# Patient Record
Sex: Male | Born: 1945 | Race: White | Hispanic: No | Smoking: Former smoker
Health system: Southern US, Community
[De-identification: ages and names within clinical notes are randomized; demographics above are authoritative.]

## PROBLEM LIST (undated history)

## (undated) DIAGNOSIS — Z972 Presence of dental prosthetic device (complete) (partial): Secondary | ICD-10-CM

## (undated) DIAGNOSIS — Z9981 Dependence on supplemental oxygen: Secondary | ICD-10-CM

## (undated) DIAGNOSIS — J40 Bronchitis, not specified as acute or chronic: Secondary | ICD-10-CM

## (undated) DIAGNOSIS — K449 Diaphragmatic hernia without obstruction or gangrene: Secondary | ICD-10-CM

## (undated) DIAGNOSIS — I509 Heart failure, unspecified: Secondary | ICD-10-CM

## (undated) DIAGNOSIS — J449 Chronic obstructive pulmonary disease, unspecified: Secondary | ICD-10-CM

## (undated) DIAGNOSIS — K219 Gastro-esophageal reflux disease without esophagitis: Secondary | ICD-10-CM

## (undated) DIAGNOSIS — K5792 Diverticulitis of intestine, part unspecified, without perforation or abscess without bleeding: Secondary | ICD-10-CM

---

## 2003-01-30 HISTORY — PX: LUNG REMOVAL, PARTIAL: SHX233

## 2003-10-01 ENCOUNTER — Other Ambulatory Visit: Payer: Self-pay

## 2003-10-29 ENCOUNTER — Inpatient Hospital Stay: Payer: Self-pay | Admitting: General Surgery

## 2003-10-29 ENCOUNTER — Other Ambulatory Visit: Payer: Self-pay

## 2003-11-24 ENCOUNTER — Other Ambulatory Visit: Payer: Self-pay

## 2009-05-08 ENCOUNTER — Ambulatory Visit: Payer: Self-pay | Admitting: Family Medicine

## 2010-02-20 ENCOUNTER — Ambulatory Visit: Payer: Self-pay | Admitting: Gastroenterology

## 2010-09-11 ENCOUNTER — Ambulatory Visit: Payer: Self-pay

## 2011-07-20 ENCOUNTER — Ambulatory Visit: Payer: Self-pay | Admitting: Medical

## 2011-07-20 LAB — URINALYSIS, COMPLETE
Bacteria: NEGATIVE
Glucose,UR: NEGATIVE mg/dL (ref 0–75)
Ketone: NEGATIVE
Nitrite: NEGATIVE
Protein: NEGATIVE
Specific Gravity: 1.005 (ref 1.003–1.030)

## 2011-07-21 LAB — URINE CULTURE

## 2012-10-11 LAB — URINALYSIS, COMPLETE
Bilirubin,UR: NEGATIVE
Glucose,UR: NEGATIVE mg/dL (ref 0–75)
Hyaline Cast: 5
Nitrite: NEGATIVE
RBC,UR: 4 /HPF (ref 0–5)
Specific Gravity: 1.025 (ref 1.003–1.030)
Squamous Epithelial: NONE SEEN
WBC UR: <1 /HPF (ref 0–5)

## 2012-10-11 LAB — CBC
HCT: 45 % (ref 40.0–52.0)
MCH: 31.8 pg (ref 26.0–34.0)
MCV: 93 fL (ref 80–100)
Platelet: 292 10*3/uL (ref 150–440)
RDW: 13.2 % (ref 11.5–14.5)

## 2012-10-11 LAB — COMPREHENSIVE METABOLIC PANEL
Albumin: 4.1 g/dL (ref 3.4–5.0)
Anion Gap: 5 — ABNORMAL LOW (ref 7–16)
BUN: 13 mg/dL (ref 7–18)
Bilirubin,Total: 0.7 mg/dL (ref 0.2–1.0)
Chloride: 104 mmol/L (ref 98–107)
Co2: 26 mmol/L (ref 21–32)
EGFR (Non-African Amer.): >60
Glucose: 110 mg/dL — ABNORMAL HIGH (ref 65–99)
Osmolality: 271 (ref 275–301)
SGPT (ALT): 31 U/L (ref 12–78)

## 2012-10-11 LAB — LIPASE, BLOOD: Lipase: >10000 U/L — ABNORMAL HIGH (ref 73–393)

## 2012-10-12 ENCOUNTER — Inpatient Hospital Stay: Payer: Self-pay | Admitting: Student

## 2012-10-12 LAB — LIPID PANEL
Cholesterol: 134 mg/dL (ref 0–200)
VLDL Cholesterol, Calc: 17 mg/dL (ref 5–40)

## 2012-10-12 LAB — CBC WITH DIFFERENTIAL/PLATELET
Eosinophil %: 0.7 %
Lymphocyte #: 1.9 10*3/uL (ref 1.0–3.6)
Lymphocyte %: 22.5 %
MCH: 32.4 pg (ref 26.0–34.0)
MCHC: 35.2 g/dL (ref 32.0–36.0)
Monocyte #: 0.8 x10 3/mm (ref 0.2–1.0)
Monocyte %: 9.8 %
Neutrophil #: 5.6 10*3/uL (ref 1.4–6.5)
Neutrophil %: 66.7 %
RBC: 4.08 10*6/uL — ABNORMAL LOW (ref 4.40–5.90)
RDW: 13.6 % (ref 11.5–14.5)
WBC: 8.4 10*3/uL (ref 3.8–10.6)

## 2012-10-12 LAB — BASIC METABOLIC PANEL
Anion Gap: 5 — ABNORMAL LOW (ref 7–16)
BUN: 11 mg/dL (ref 7–18)
Chloride: 107 mmol/L (ref 98–107)
Co2: 27 mmol/L (ref 21–32)
EGFR (Non-African Amer.): >60
Glucose: 87 mg/dL (ref 65–99)
Potassium: 3.7 mmol/L (ref 3.5–5.1)
Sodium: 139 mmol/L (ref 136–145)

## 2012-10-12 LAB — LIPASE, BLOOD: Lipase: 5641 U/L — ABNORMAL HIGH (ref 73–393)

## 2012-10-13 LAB — LIPASE, BLOOD: Lipase: 1057 U/L — ABNORMAL HIGH (ref 73–393)

## 2012-10-15 LAB — LIPASE, BLOOD: Lipase: 606 U/L — ABNORMAL HIGH (ref 73–393)

## 2012-11-08 ENCOUNTER — Emergency Department: Payer: Self-pay | Admitting: Emergency Medicine

## 2012-11-08 LAB — CBC
MCH: 32.1 pg (ref 26.0–34.0)
MCHC: 34.9 g/dL (ref 32.0–36.0)
Platelet: 250 10*3/uL (ref 150–440)
RBC: 4.42 10*6/uL (ref 4.40–5.90)
WBC: 5.6 10*3/uL (ref 3.8–10.6)

## 2012-11-08 LAB — URINALYSIS, COMPLETE
Bacteria: NONE SEEN
Blood: NEGATIVE
Glucose,UR: NEGATIVE mg/dL (ref 0–75)
Leukocyte Esterase: NEGATIVE
Nitrite: NEGATIVE
Ph: 5 (ref 4.5–8.0)
Protein: NEGATIVE
Specific Gravity: 1.006 (ref 1.003–1.030)
Squamous Epithelial: <1
WBC UR: 1 /HPF (ref 0–5)

## 2012-11-08 LAB — COMPREHENSIVE METABOLIC PANEL
Albumin: 4 g/dL (ref 3.4–5.0)
Anion Gap: 4 — ABNORMAL LOW (ref 7–16)
Bilirubin,Total: 0.4 mg/dL (ref 0.2–1.0)
Creatinine: 1.18 mg/dL (ref 0.60–1.30)
EGFR (African American): >60
EGFR (Non-African Amer.): >60
Glucose: 93 mg/dL (ref 65–99)
Potassium: 4.3 mmol/L (ref 3.5–5.1)
SGPT (ALT): 24 U/L (ref 12–78)
Sodium: 136 mmol/L (ref 136–145)
Total Protein: 7.7 g/dL (ref 6.4–8.2)

## 2012-11-08 LAB — LIPASE, BLOOD: Lipase: 206 U/L (ref 73–393)

## 2012-11-09 LAB — TROPONIN I: Troponin-I: <0.02 ng/mL

## 2012-12-04 ENCOUNTER — Ambulatory Visit: Payer: Self-pay | Admitting: Unknown Physician Specialty

## 2012-12-04 DIAGNOSIS — K449 Diaphragmatic hernia without obstruction or gangrene: Secondary | ICD-10-CM | POA: Insufficient documentation

## 2012-12-04 DIAGNOSIS — K2980 Duodenitis without bleeding: Secondary | ICD-10-CM | POA: Insufficient documentation

## 2012-12-24 ENCOUNTER — Ambulatory Visit: Payer: Self-pay | Admitting: Physician Assistant

## 2012-12-24 LAB — CBC WITH DIFFERENTIAL/PLATELET
Basophil #: 0.1 10*3/uL (ref 0.0–0.1)
Basophil %: 0.8 %
Eosinophil #: 0 10*3/uL (ref 0.0–0.7)
Eosinophil %: 0.1 %
HGB: 15.1 g/dL (ref 13.0–18.0)
Lymphocyte #: 1.2 10*3/uL (ref 1.0–3.6)
Lymphocyte %: 15.3 %
MCH: 31.8 pg (ref 26.0–34.0)
MCHC: 33.5 g/dL (ref 32.0–36.0)
MCV: 95 fL (ref 80–100)
Monocyte #: 0.3 x10 3/mm (ref 0.2–1.0)
Monocyte %: 4 %
Platelet: 300 10*3/uL (ref 150–440)
RDW: 13.2 % (ref 11.5–14.5)
WBC: 8 10*3/uL (ref 3.8–10.6)

## 2012-12-24 LAB — COMPREHENSIVE METABOLIC PANEL
Alkaline Phosphatase: 96 U/L
Anion Gap: 10 (ref 7–16)
BUN: 15 mg/dL (ref 7–18)
Bilirubin,Total: 0.7 mg/dL (ref 0.2–1.0)
Calcium, Total: 9.5 mg/dL (ref 8.5–10.1)
Chloride: 101 mmol/L (ref 98–107)
Creatinine: 1.14 mg/dL (ref 0.60–1.30)
Glucose: 125 mg/dL — ABNORMAL HIGH (ref 65–99)
Potassium: 4.2 mmol/L (ref 3.5–5.1)
SGOT(AST): 19 U/L (ref 15–37)
SGPT (ALT): 26 U/L (ref 12–78)

## 2012-12-24 LAB — AMYLASE: Amylase: 41 U/L (ref 25–115)

## 2013-01-01 ENCOUNTER — Ambulatory Visit: Payer: Self-pay | Admitting: Unknown Physician Specialty

## 2013-01-06 ENCOUNTER — Ambulatory Visit: Payer: Self-pay

## 2013-07-14 DIAGNOSIS — J309 Allergic rhinitis, unspecified: Secondary | ICD-10-CM | POA: Insufficient documentation

## 2013-07-18 ENCOUNTER — Ambulatory Visit: Payer: Self-pay | Admitting: Physician Assistant

## 2013-07-18 LAB — CBC WITH DIFFERENTIAL/PLATELET
Basophil #: 0 10*3/uL (ref 0.0–0.1)
Basophil %: 0.7 %
Eosinophil #: 0 10*3/uL (ref 0.0–0.7)
Eosinophil %: 0.4 %
HCT: 42.2 % (ref 40.0–52.0)
HGB: 14.1 g/dL (ref 13.0–18.0)
LYMPHS ABS: 1.2 10*3/uL (ref 1.0–3.6)
LYMPHS PCT: 19.8 %
MCH: 30.4 pg (ref 26.0–34.0)
MCHC: 33.3 g/dL (ref 32.0–36.0)
MCV: 91 fL (ref 80–100)
Monocyte #: 0.5 x10 3/mm (ref 0.2–1.0)
Monocyte %: 7.9 %
Neutrophil #: 4.3 10*3/uL (ref 1.4–6.5)
Neutrophil %: 71.2 %
Platelet: 282 10*3/uL (ref 150–440)
RBC: 4.62 10*6/uL (ref 4.40–5.90)
RDW: 14 % (ref 11.5–14.5)
WBC: 6 10*3/uL (ref 3.8–10.6)

## 2013-07-18 LAB — COMPREHENSIVE METABOLIC PANEL
ALT: 25 U/L (ref 12–78)
Albumin: 4.1 g/dL (ref 3.4–5.0)
Alkaline Phosphatase: 116 U/L
Anion Gap: 7 (ref 7–16)
BUN: 14 mg/dL (ref 7–18)
Bilirubin,Total: 0.8 mg/dL (ref 0.2–1.0)
CO2: 30 mmol/L (ref 21–32)
Calcium, Total: 9.8 mg/dL (ref 8.5–10.1)
Chloride: 101 mmol/L (ref 98–107)
Creatinine: 1.21 mg/dL (ref 0.60–1.30)
EGFR (African American): >60
EGFR (Non-African Amer.): >60
GLUCOSE: 101 mg/dL — AB (ref 65–99)
OSMOLALITY: 276 (ref 275–301)
Potassium: 4.7 mmol/L (ref 3.5–5.1)
SGOT(AST): 18 U/L (ref 15–37)
SODIUM: 138 mmol/L (ref 136–145)
Total Protein: 8.2 g/dL (ref 6.4–8.2)

## 2013-07-18 LAB — LIPASE, BLOOD: Lipase: 158 U/L (ref 73–393)

## 2013-07-18 LAB — AMYLASE: Amylase: 37 U/L (ref 25–115)

## 2013-09-04 ENCOUNTER — Ambulatory Visit: Payer: Self-pay | Admitting: Unknown Physician Specialty

## 2013-09-08 LAB — PATHOLOGY REPORT

## 2013-09-15 ENCOUNTER — Ambulatory Visit: Payer: Self-pay | Admitting: Unknown Physician Specialty

## 2014-02-01 DIAGNOSIS — M9905 Segmental and somatic dysfunction of pelvic region: Secondary | ICD-10-CM | POA: Diagnosis not present

## 2014-02-01 DIAGNOSIS — M5136 Other intervertebral disc degeneration, lumbar region: Secondary | ICD-10-CM | POA: Diagnosis not present

## 2014-02-01 DIAGNOSIS — M9903 Segmental and somatic dysfunction of lumbar region: Secondary | ICD-10-CM | POA: Diagnosis not present

## 2014-02-01 DIAGNOSIS — M955 Acquired deformity of pelvis: Secondary | ICD-10-CM | POA: Diagnosis not present

## 2014-05-21 NOTE — Discharge Summary (Signed)
PATIENT NAME:  Charles Webster, Charles Webster MR#:  [id number] DATE OF BIRTH:  [birth year]  DATE OF ADMISSION:  10/12/2012 DATE OF DISCHARGE:  10/15/2012  CONSULTANT: Dr. Vira Agar.   PRIMARY CARE PHYSICIAN: Dr. Domenick Gong.   CHIEF COMPLAINT: Abdominal pain.   DISCHARGE DIAGNOSES:  1. Acute pancreatitis.  2. Some blood in the stools, probably from his hemorrhoids.  3. Some fullness of the head of the pancreas on a CAT scan with elevated CA 19-9.   DISCHARGE MEDICATIONS: None.   DIET: GI soft.   ACTIVITY: As tolerated.   FOLLOWUP: Please follow with Dr. Vira Agar and PCP within 1 to 2 weeks.   DISPOSITION: Home.   HISTORY OF PRESENT ILLNESS AND HOSPITAL COURSE: For full details of H and P, please see the dictation on September 14th by Dr. Lavetta Nielsen, but briefly, this is a pleasant 69 year old male with history of chronic prostatitis, on no outpatient medications, who came in for abdominal pain 10 out of 10 and was noted to have pancreatitis with elevated lipase. He was admitted to the hospitalist service with aggressive IV fluid resuscitation, pain control and a GI consult. He did well with significant reduction in his lipase through hospitalization. Of note, it was greater than 10,000 on admission  and it had dropped to 5641 the day after admission. Today, it is 606. His triglycerides were not elevated. His LFTs do not show elevated bilirubin, elevated transaminitis or alk phos. GI thought that this was probably gallstone related and he might have flicked a gallstone. Although that is possible, there is no sludge or gallstones seen on the ultrasound. He had significant improvement in pain and at this point will be discharged with outpatient followup. He did state he had some bleeding on wiping himself, likely from hemorrhoids. He had a colonoscopy from 2012 in which external and internal hemorrhoids were seen. His blood counts are stable. His blood pressure is stable, and he will be discharged as an outpatient. Of  note, he had a CAT scan of the abdomen with contrast showing not only the pancreatitis but some fullness within the head of the uncinate process of the pancreas. He will be discharged with outpatient followup.   PHYSICAL EXAMINATION:  VITAL SIGNS: On the day of discharge, his temperature is 97.6, pulse rate is 89, respiratory rate is 18, blood pressure 135/85, O2 sat is 97% on room air.  GENERAL: Thin male, ambulating in the room, no obvious distress.  HEENT: Normocephalic, atraumatic.  CARDIOVASCULAR: The patient has regular rate and rhythm without murmurs, rubs or gallops on examination of his heart.  LUNGS: Clear.  ABDOMEN: Soft, nontender.  EXTREMITIES: Show no lower extremity edema.   He has been ambulating, tolerating advancement of his diet. He will be discharged with outpatient followup.   TOTAL TIME SPENT: 35 minutes.   CODE STATUS: The patient is FULL CODE.   ____________________________ Vivien Presto, MD sa:gb D: 10/15/2012 15:53:45 ET T: 10/15/2012 21:01:23 ET JOB#: [id number]  cc: Vivien Presto, MD, <Dictator> Fonnie Jarvis. Ilene Qua, MD Vivien Presto MD ELECTRONICALLY SIGNED 10/21/2012 14:08

## 2014-05-21 NOTE — H&P (Signed)
PATIENT NAME:  Charles Webster, Charles Webster MR#:  [id number] DATE OF BIRTH:  Nov 06, 1945  DATE OF ADMISSION:  10/12/2012  REFERRING PHYSICIAN:  Dr. Reita Cliche.  PRIMARY CARE PHYSICIAN: Dr. Ilene Qua.  HISTORY OF PRESENT ILLNESS:  Charles Webster is a 69 year old Caucasian gentleman with a past medical history of chronic prostatitis on Macrobid suppressive therapy, as well as a history of lung cancer for bleb back in 2005, who is presenting with abdominal pain. His symptoms started acutely approximately one week ago with epigastric pain which then evolved to a bandlike radiation. His pain worsened over the week, to the point that he is intolerant to p.o. intake. He described the intensity of 10 and 10 at the worst, constant, and worsened by food. He found no relieving factors, until given pain medications in the Emergency Department. After Fentanyl, he is currently without complaints at the moment. He states that is pain was without associated nausea, vomiting, diarrhea, fevers, chills.  He denies any weight loss. He denies any night sweats.    REVIEW OF SYSTEMS: CONSTITUTIONAL: Denies fevers, fatigue and weakness. Pain is currently controlled.  EYES: Denies blurred vision or pain.  ENT: Denies ear pain or discharge. RESPIRATORY: Denies cough or shortness of breath.  CARDIOVASCULAR: Denies chest pain or palpitations.  GASTROINTESTINAL: Denies nausea, vomiting, diarrhea; however, did mention abdominal pain as above.  GENITOURINARY: Denies dysuria, hematuria.  ENDOCRINE: Denies nocturia or thyroid problems.  HEMATOLOGIC/LYMPHATICS: Denies any easy bruising or bleeding.  MUSCULOSKELETAL: Denies back pain or arthritis.  NEUROLOGIC: Denies paralysis or paresthesias.  PSYCHIATRIC: Denies anxiety or depression.   Otherwise, full review of systems performed by me is negative.   PAST MEDICAL HISTORY: Chronic prostatitis, history of bleb requiring lung surgery back in 2005.   FAMILY HISTORY: Diabetes.   SOCIAL HISTORY: He has a  remote history of tobacco abuse. He stopped smoking back in 2005. Denies alcohol or drug intake. No recent travel.   ALLERGIES: alpha BLOCKING AGENTS, QUINOLONES, CEPHALEXIN, CIPRO, CLINDAMYCIN, DOXYCYCLINE, LEVAQUIN AS WELL AS SULFA DRUGS.   HOME MEDICATIONS: He was taking Macrobid, but stopped this about a week ago.   PHYSICAL EXAMINATION: VITAL SIGNS: Temperature 98.4, heart rate 100, respirations 20, blood pressure 131/87, saturating 96% on room air. Weight 62.6 kg, BMI 22.3.  GENERAL: Currently in no acute distress, awake, alert and oriented x 3.  HEENT: Normocephalic, atraumatic. Extraocular muscles intact. Pupils equal, react to light as well as accommodation. There is no scleral icterus or sublingual jaundice present.  CARDIOVASCULAR: S1, S2, regular rate and rhythm. No murmurs, rubs or gallops.  PULMONARY: Clear to auscultation bilateral wheezes, rubs or rhonchi.  ABDOMEN: Currently soft, nontender, nondistended, hypoactive bowel sounds.  EXTREMITIES: Reveal no cyanosis, edema or clubbing.  NEUROLOGIC: Cranial nerves II through XII intact. No gross neurological deficits.   LABORATORY DATA: Sodium 135, potassium 4.3, chloride 104, bicarb 26, BUN 13, creatinine 1.17, glucose 110, lipase greater than 10,000, total protein 8.3, albumin 4.1, bilirubin 0.7, alk phos 108, AST 37, ALT 31, WBC of 13.2,  hemoglobin of 15.4, platelets 292. Urinalysis negative for signs of infection. He does have 2+ ketones present. CT scan performed of the abdomen; final read pending.  Preliminary read reveals head of pancreas abnormality, which is enlarged.  ASSESSMENT AND PLAN: A 69 year old gentleman, history of chronic prostatitis, presenting with abdominal pain for over a week. In the Emergency Department, found to have a lipase greater than 10,000,   1.  Pancreatitis. Lipase greater than 10,000.  Morphine for pain control. IV  fluids at 250 mL an hour. We will recheck lipase, BMP and CBC, to follow along BUN  and hematocrit for adequate fluid resuscitation. Follow up on CT final read, which was performed in the Emergency Department and once again it appears to be head of pancreas abnormality, but also check abdominal ultrasound to check gallbladder biliary tract, as well as head of pancreas. He is to remain n.p.o. at this time.  2.  Hyponatremia. Continue IV fluid hydration. Follow sodium levels. 3.  CODE STATUS:  He is full code.   Total time spent:  33 minutes.    ____________________________ [name], MD dkh:nts D: 10/12/2012 01:49:31 ET T: 10/12/2012 02:20:02 ET JOB#: [id number]  cc: [name], MD, <Dictator> DAVID Woodfin Ganja MD ELECTRONICALLY SIGNED 10/12/2012 2:52

## 2014-05-21 NOTE — Consult Note (Signed)
CC: pancreatitis.  Pt lipase fell from 10,000 to 1000 and he has no pain in abdomen.  he likely passed a stone.  On clear liquids,. Abd soft and non tender.  he has rectal bleeding from hemorrhoids (per patient)  I am not sure when he has had a colonoscopy.  Will need one later if not up to date.   Continue current course.  Electronic Signatures: Scot JunElliott, Casmir Auguste T (MD)  (Signed on 15-Sep-14 13:31)  Authored  Last Updated: 15-Sep-14 13:31 by Scot JunElliott, Koraima Albertsen T (MD)

## 2014-05-21 NOTE — Consult Note (Signed)
PATIENT NAME:  Charles Webster, Charles Webster MR#:  [id number] DATE OF BIRTH:  08/28/1945  GASTROENTEROLOGY CONSULTATION  DATE OF CONSULTATION:  10/12/2012  The patient is a 69 year old white male who had an onset of abdominal pain a week ago, with radiation across abdomen into his back pain. The pain became intolerable,  with an intensity of 10/10 and seemed to be worse with food. He was seen in the ER and found to have acute pancreatitis. Was  given fentanyl, and his last fentanyl was 7 hours ago. I was asked to see him in consultation for pancreatitis.   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   This is the his bout of pancreatitis.   PAST MEDICAL HISTORY: Includes three operations for lung surgery. He had a bleb that ruptured. Had 2 operations here, and [facility].   REVIEW OF SYSTEMS: No fever. No shortness of breath. No chest pains. No diarrhea. He does have chronic prostatitis. He has been on various medications for this in the past. He had been on Macrobid therapy recently.   FAMILY HISTORY: Positive for diabetes.   SOCIAL HISTORY: Stopped smoking in 2005.   ALLERGIES: He lists multiple medicines as allergies, but they actually were more intolerances, often causing abdominal discomfort. FOR INSTANCE, HE SAID HE TOOK CIPRO FOR MANY YEARS AND THEN IT STARTED BOTHERING HIS STOMACH, SO MOST OF THE MEDICINES THAT HE LISTS ARE NOT TRUE ALLERGIES BUT MORE DEVELOPED SIDE-EFFECTS AFTER TAKING THEM.  PHYSICAL EXAMINATION: GENERAL: White male in no acute distress.  HEENT: Sclerae nonicteric. Conjunctivae negative.  HEAD: Atraumatic.  CHEST: Clear.   HEART: Showed no murmurs or gallops I can hear.  ABDOMEN: Only slight tenderness in epigastric area. No hepatosplenomegaly. No masses. No bruits.   LAB DATA: Lipase greater than 10,000. Sodium 135, potassium 4.3,  chloride 104, bicarb 26, BUN 13, creatinine 1.17, glucose 110, albumin 4.1, total bilirubin 0.7, alkaline phosphatase 108, AST 37, ALT 31. WBC is 13.2, hemoglobin 15.4, platelet count 292.   CAT scan shows abnormality in the head of the pancreas.   ASSESSMENT: Given the fact that he does not drink and he is 66, most likely cause for his pancreatitis would be gallstone pancreatitis. Given fullness in the head of the pancreas it is possible that he could have a pancreatic tumor with duct blockage.   The fact that he has had no pain medicine in 7 hours and is feeling very little pain at this time may indicate that he has passed a stone, or there could be a ball-valve up in the common bile duct.   RECOMMENDATIONS: 1.  Continue IV fluid, with aggressive hydration.  2. Follow lab work: Based on his improvement in symptoms, I expect to see his lipase fall rapidly in the next couple of days. If he ever has another attack consider empiric gallbladder removal.    ____________________________ Scot Junobert T. Dal Blew, MD rte:dm D: 10/12/2012 13:06:05 ET T: 10/12/2012 13:38:00 ET JOB#: [id number]  cc: Scot Junobert T. Breydon Senters, MD, <Dictator> Randon Goldsmithebecca L. Shaune PollackLord, MD Jorje GuildGlenn R. Beckey DowningWillett, MD   Scot JunOBERT T Kincaid Tiger MD ELECTRONICALLY SIGNED 11/01/2012 15:26

## 2014-05-21 NOTE — Consult Note (Signed)
CC: pancreatitis.  He wants to go home in morning, feels like he could eat more food in morning. I will sign off. VSS afebrile, no pain to report.  Electronic Signatures: Scot JunElliott, Julliette Frentz T (MD)  (Signed on 16-Sep-14 18:06)  Authored  Last Updated: 16-Sep-14 18:06 by Scot JunElliott, Zeniah Briney T (MD)

## 2014-08-10 ENCOUNTER — Encounter: Payer: Self-pay | Admitting: *Deleted

## 2014-08-10 ENCOUNTER — Ambulatory Visit: Payer: Medicare Other

## 2014-08-10 ENCOUNTER — Ambulatory Visit
Admission: EM | Admit: 2014-08-10 | Discharge: 2014-08-10 | Disposition: A | Discharge status: Home / Self Care | Payer: Medicare Other | Attending: Family Medicine | Admitting: Family Medicine

## 2014-08-10 DIAGNOSIS — Z902 Acquired absence of lung [part of]: Secondary | ICD-10-CM | POA: Diagnosis not present

## 2014-08-10 DIAGNOSIS — R0602 Shortness of breath: Secondary | ICD-10-CM | POA: Diagnosis not present

## 2014-08-10 DIAGNOSIS — R059 Cough, unspecified: Secondary | ICD-10-CM

## 2014-08-10 DIAGNOSIS — Z87891 Personal history of nicotine dependence: Secondary | ICD-10-CM | POA: Diagnosis not present

## 2014-08-10 DIAGNOSIS — Z79899 Other long term (current) drug therapy: Secondary | ICD-10-CM | POA: Insufficient documentation

## 2014-08-10 DIAGNOSIS — R05 Cough: Secondary | ICD-10-CM | POA: Diagnosis not present

## 2014-08-10 DIAGNOSIS — J40 Bronchitis, not specified as acute or chronic: Secondary | ICD-10-CM | POA: Insufficient documentation

## 2014-08-10 MED ORDER — IPRATROPIUM-ALBUTEROL 0.5-2.5 (3) MG/3ML IN SOLN
3.0000 mL | Freq: Once | RESPIRATORY_TRACT | Status: AC
  Administered 2014-08-10: 3 mL via RESPIRATORY_TRACT

## 2014-08-10 MED ORDER — HYDROCOD POLST-CPM POLST ER 10-8 MG/5ML PO SUER: 5.0000 mL | Freq: Two times a day (BID) | ORAL | Status: DC | PRN

## 2014-08-10 MED ORDER — AZITHROMYCIN 250 MG PO TABS: ORAL_TABLET | ORAL | Status: DC

## 2014-08-10 NOTE — ED Notes (Signed)
Pt states "started about 2 weeks ago, thought it was allergies, congestion, sore throat from coughing so much, headache from coughing"

## 2014-08-10 NOTE — ED Provider Notes (Signed)
CSN: [id number]     Arrival date & time 08/10/14  1736 History   First MD Initiated Contact with Patient 08/10/14 1815     No chief complaint on file.  (Consider location/radiation/quality/duration/timing/severity/associated sxs/prior Treatment) HPI Comments: 69 yo male with a 2-3 weeks h/o productive cough, fatigue, chills. Patient is a former smoker who also worked in [employer]; h/o partial right lung lobectomy years ago. Denies chest pains.   The history is provided by the patient.    History reviewed. No pertinent past medical history. Past Surgical History  Procedure Laterality Date  . Lung removal, partial  2005    removed 20% of right lung   No family history on file. History  Substance Use Topics  . Smoking status: Former Smoker    Quit date: 10/04/2003  . Smokeless tobacco: Not on file  . Alcohol Use: No    Review of Systems  Allergies  Alpha blocker quinazolines; Clindamycin/lincomycin; Levaquin; and Penicillins  Home Medications   Prior to Admission medications   Medication Sig Start Date End Date Taking? Authorizing Provider  dextromethorphan-guaiFENesin (ROBITUSSIN-DM) 10-100 MG/5ML liquid Take by mouth every 4 (four) hours as needed for cough.   Yes Historical Provider, MD  fluticasone (FLONASE) 50 MCG/ACT nasal spray Place into both nostrils daily.   Yes Historical Provider, MD  guaiFENesin (MUCINEX) 600 MG 12 hr tablet Take by mouth 2 (two) times daily.   Yes Historical Provider, MD  omeprazole (PRILOSEC) 40 MG capsule Take 40 mg by mouth daily.   Yes Historical Provider, MD  azithromycin (ZITHROMAX Z-PAK) 250 MG tablet 2 tabs po once day 1, then 1 tab po qd for next 4 days 08/10/14   Payton Mccallumrlando Saraia Platner, MD  chlorpheniramine-HYDROcodone [facility](TUSSIONEX PENNKINETIC ER) 10-8 MG/5ML SUER Take 5 mLs by mouth every 12 (twelve) hours as needed for cough. 08/10/14   Payton Mccallumrlando Jisell Majer, MD   BP 156/119 mmHg  Pulse 90  Temp(Src) 98.2 F (36.8 C) (Oral)  Ht 5\' 6"  (1.676 m)  Wt  130 lb (58.968 kg)  BMI 20.99 kg/m2  SpO2 95% Physical Exam  Constitutional: He appears well-developed and well-nourished. No distress.  HENT:  Head: Normocephalic and atraumatic.  Right Ear: Tympanic membrane, external ear and ear canal normal.  Left Ear: Tympanic membrane, external ear and ear canal normal.  Nose: Nose normal.  Mouth/Throat: Uvula is midline, oropharynx is clear and moist and mucous membranes are normal. No oropharyngeal exudate or tonsillar abscesses.  Eyes: Conjunctivae and EOM are normal. Pupils are equal, round, and reactive to light. Right eye exhibits no discharge. Left eye exhibits no discharge. No scleral icterus.  Neck: Normal range of motion. Neck supple. No tracheal deviation present. No thyromegaly present.  Cardiovascular: Normal rate, regular rhythm and normal heart sounds.   Pulmonary/Chest: Effort normal. No stridor. No respiratory distress. He has wheezes. He has no rales. He exhibits no tenderness.  Mild diffuse expiratory wheezes and rhonchi  Lymphadenopathy:    He has no cervical adenopathy.  Neurological: He is alert.  Skin: Skin is warm and dry. No rash noted. He is not diaphoretic.  Nursing note and vitals reviewed.   ED Course  Procedures (including critical care time) Labs Review Labs Reviewed - No data to display  Imaging Review Dg Chest 2 View  08/10/2014   CLINICAL DATA:  Cough for 2 weeks, shortness of Breath, history of right lung surgery  EXAM: CHEST  2 VIEW  COMPARISON:  05/08/2009  FINDINGS: Cardiomediastinal silhouette is stable. Again noted  status post right upper lobectomy. No acute infiltrate or pleural effusion. No pulmonary edema. Hyperinflation again noted. There is chronic blunting of the right costophrenic angle. Left lung is clear. Stable postsurgical changes in right upper lobe.  IMPRESSION: Stable postsurgical changes post right upper lobectomy. No active disease. Hyperinflation again noted. Again noted chronic blunting of  the right costophrenic angle.   Electronically Signed   By: Natasha Mead M.D.   On: 08/10/2014 19:06     MDM   1. Cough   2. Bronchitis    Discharge Medication List as of 08/10/2014  7:33 PM    START taking these medications   Details  azithromycin (ZITHROMAX Z-PAK) 250 MG tablet 2 tabs po once day 1, then 1 tab po qd for next 4 days, Normal    chlorpheniramine-HYDROcodone (TUSSIONEX PENNKINETIC ER) 10-8 MG/5ML SUER Take 5 mLs by mouth every 12 (twelve) hours as needed for cough., Starting 08/10/2014, Until Discontinued, Normal      Plan: 1.x-ray results and diagnosis reviewed with patient 2. rx as per orders; risks, benefits, potential side effects reviewed with patient 3. Recommend supportive treatment with rest, increased fluids 4. F/u prn if symptoms worsen or don't improve    Payton Mccallum, MD 08/10/14 2015

## 2014-08-20 DIAGNOSIS — R05 Cough: Secondary | ICD-10-CM | POA: Diagnosis not present

## 2014-08-20 DIAGNOSIS — N411 Chronic prostatitis: Secondary | ICD-10-CM | POA: Diagnosis not present

## 2014-10-06 DIAGNOSIS — H3531 Nonexudative age-related macular degeneration: Secondary | ICD-10-CM | POA: Diagnosis not present

## 2014-11-19 DIAGNOSIS — N411 Chronic prostatitis: Secondary | ICD-10-CM | POA: Diagnosis not present

## 2014-12-29 DIAGNOSIS — M9905 Segmental and somatic dysfunction of pelvic region: Secondary | ICD-10-CM | POA: Diagnosis not present

## 2014-12-29 DIAGNOSIS — M9903 Segmental and somatic dysfunction of lumbar region: Secondary | ICD-10-CM | POA: Diagnosis not present

## 2014-12-29 DIAGNOSIS — M5136 Other intervertebral disc degeneration, lumbar region: Secondary | ICD-10-CM | POA: Diagnosis not present

## 2014-12-29 DIAGNOSIS — M955 Acquired deformity of pelvis: Secondary | ICD-10-CM | POA: Diagnosis not present

## 2015-01-01 DIAGNOSIS — M9903 Segmental and somatic dysfunction of lumbar region: Secondary | ICD-10-CM | POA: Diagnosis not present

## 2015-01-01 DIAGNOSIS — M5136 Other intervertebral disc degeneration, lumbar region: Secondary | ICD-10-CM | POA: Diagnosis not present

## 2015-01-01 DIAGNOSIS — M955 Acquired deformity of pelvis: Secondary | ICD-10-CM | POA: Diagnosis not present

## 2015-01-01 DIAGNOSIS — M9905 Segmental and somatic dysfunction of pelvic region: Secondary | ICD-10-CM | POA: Diagnosis not present

## 2015-01-03 DIAGNOSIS — M9903 Segmental and somatic dysfunction of lumbar region: Secondary | ICD-10-CM | POA: Diagnosis not present

## 2015-01-03 DIAGNOSIS — M9905 Segmental and somatic dysfunction of pelvic region: Secondary | ICD-10-CM | POA: Diagnosis not present

## 2015-01-03 DIAGNOSIS — M5136 Other intervertebral disc degeneration, lumbar region: Secondary | ICD-10-CM | POA: Diagnosis not present

## 2015-01-03 DIAGNOSIS — M955 Acquired deformity of pelvis: Secondary | ICD-10-CM | POA: Diagnosis not present

## 2015-01-11 DIAGNOSIS — M9905 Segmental and somatic dysfunction of pelvic region: Secondary | ICD-10-CM | POA: Diagnosis not present

## 2015-01-11 DIAGNOSIS — M9903 Segmental and somatic dysfunction of lumbar region: Secondary | ICD-10-CM | POA: Diagnosis not present

## 2015-01-11 DIAGNOSIS — M5136 Other intervertebral disc degeneration, lumbar region: Secondary | ICD-10-CM | POA: Diagnosis not present

## 2015-01-11 DIAGNOSIS — M955 Acquired deformity of pelvis: Secondary | ICD-10-CM | POA: Diagnosis not present

## 2015-04-09 ENCOUNTER — Ambulatory Visit
Admission: EM | Admit: 2015-04-09 | Discharge: 2015-04-09 | Disposition: A | Discharge status: Home / Self Care | Payer: Medicare Other | Attending: Family Medicine | Admitting: Family Medicine

## 2015-04-09 ENCOUNTER — Encounter: Payer: Self-pay | Admitting: Emergency Medicine

## 2015-04-09 ENCOUNTER — Ambulatory Visit: Payer: Medicare Other

## 2015-04-09 DIAGNOSIS — Z87891 Personal history of nicotine dependence: Secondary | ICD-10-CM | POA: Diagnosis not present

## 2015-04-09 DIAGNOSIS — J069 Acute upper respiratory infection, unspecified: Secondary | ICD-10-CM | POA: Diagnosis not present

## 2015-04-09 DIAGNOSIS — J40 Bronchitis, not specified as acute or chronic: Secondary | ICD-10-CM | POA: Insufficient documentation

## 2015-04-09 DIAGNOSIS — J209 Acute bronchitis, unspecified: Secondary | ICD-10-CM | POA: Diagnosis not present

## 2015-04-09 DIAGNOSIS — R0602 Shortness of breath: Secondary | ICD-10-CM | POA: Diagnosis present

## 2015-04-09 DIAGNOSIS — R05 Cough: Secondary | ICD-10-CM | POA: Diagnosis not present

## 2015-04-09 DIAGNOSIS — Z880 Allergy status to penicillin: Secondary | ICD-10-CM | POA: Insufficient documentation

## 2015-04-09 DIAGNOSIS — Z79899 Other long term (current) drug therapy: Secondary | ICD-10-CM | POA: Diagnosis not present

## 2015-04-09 MED ORDER — PREDNISONE 10 MG (21) PO TBPK: ORAL_TABLET | ORAL | Status: DC

## 2015-04-09 MED ORDER — HYDROCOD POLST-CPM POLST ER 10-8 MG/5ML PO SUER: 5.0000 mL | Freq: Two times a day (BID) | ORAL | Status: DC | PRN

## 2015-04-09 MED ORDER — AZITHROMYCIN 250 MG PO TABS: ORAL_TABLET | ORAL | Status: DC

## 2015-04-09 MED ORDER — ALBUTEROL SULFATE HFA 108 (90 BASE) MCG/ACT IN AERS: 2.0000 | INHALATION_SPRAY | RESPIRATORY_TRACT | Status: DC | PRN

## 2015-04-09 MED ORDER — IPRATROPIUM-ALBUTEROL 0.5-2.5 (3) MG/3ML IN SOLN
3.0000 mL | Freq: Once | RESPIRATORY_TRACT | Status: AC
  Administered 2015-04-09: 3 mL via RESPIRATORY_TRACT

## 2015-04-09 NOTE — Discharge Instructions (Signed)
Acute Bronchitis °Bronchitis is when the airways that extend from the windpipe into the lungs get red, puffy, and painful (inflamed). Bronchitis often causes thick spit (mucus) to develop. This leads to a cough. A cough is the most common symptom of bronchitis. °In acute bronchitis, the condition usually begins suddenly and goes away over time (usually in 2 weeks). Smoking, allergies, and asthma can make bronchitis worse. Repeated episodes of bronchitis may cause more lung problems. °HOME CARE °· Rest. °· Drink enough fluids to keep your pee (urine) clear or pale yellow (unless you need to limit fluids as told by your doctor). °· Only take over-the-counter or prescription medicines as told by your doctor. °· Avoid smoking and secondhand smoke. These can make bronchitis worse. If you are a smoker, think about using nicotine gum or skin patches. Quitting smoking will help your lungs heal faster. °· Reduce the chance of getting bronchitis again by: °¨ Washing your hands often. °¨ Avoiding people with cold symptoms. °¨ Trying not to touch your hands to your mouth, nose, or eyes. °· Follow up with your doctor as told. °GET HELP IF: °Your symptoms do not improve after 1 week of treatment. Symptoms include: °· Cough. °· Fever. °· Coughing up thick spit. °· Body aches. °· Chest congestion. °· Chills. °· Shortness of breath. °· Sore throat. °GET HELP RIGHT AWAY IF:  °· You have an increased fever. °· You have chills. °· You have severe shortness of breath. °· You have bloody thick spit (sputum). °· You throw up (vomit) often. °· You lose too much body fluid (dehydration). °· You have a severe headache. °· You faint. °MAKE SURE YOU:  °· Understand these instructions. °· Will watch your condition. °· Will get help right away if you are not doing well or get worse. °  °This information is not intended to replace advice given to you by your health care provider. Make sure you discuss any questions you have with your health care  provider. °  °Document Released: 07/04/2007 Document Revised: 09/17/2012 Document Reviewed: 07/08/2012 °Elsevier Interactive Patient Education ©2016 [employer] Inc[employer] ° °Cough, Adult °A cough helps to clear your throat and lungs. A cough may last only 2-3 weeks (acute), or it may last longer than 8 weeks (chronic). Many different things can cause a cough. A cough may be a sign of an illness or another medical condition. °HOME CARE °· Pay attention to any changes in your cough. °· Take medicines only as told by your doctor. °¨ If you were prescribed an antibiotic medicine, take it as told by your doctor. Do not stop taking it even if you start to feel better. °¨ Talk with your doctor before you try using a cough medicine. °· Drink enough fluid to keep your pee (urine) clear or pale yellow. °· If the air is dry, use a cold steam vaporizer or humidifier in your home. °· Stay away from things that make you cough at work or at home. °· If your cough is worse at night, try using extra pillows to raise your head up higher while you sleep. °· Do not smoke, and try not to be around smoke. If you need help quitting, ask your doctor. °· Do not have caffeine. °· Do not drink alcohol. °· Rest as needed. °GET HELP IF: °· You have new problems (symptoms). °· You cough up yellow fluid (pus). °· Your cough does not get better after 2-3 weeks, or your cough gets worse. °· Medicine does not   help your cough and you are not sleeping well. °· You have pain that gets worse or pain that is not helped with medicine. °· You have a fever. °· You are losing weight and you do not know why. °· You have night sweats. °GET HELP RIGHT AWAY IF: °· You cough up blood. °· You have trouble breathing. °· Your heartbeat is very fast. °  °This information is not intended to replace advice given to you by your health care provider. Make sure you discuss any questions you have with your health care provider. °  °Document Released: 09/28/2010 Document Revised:  10/06/2014 Document Reviewed: 03/24/2014 °Elsevier Interactive Patient Education ©2016 [employer] Inc. ° °

## 2015-04-09 NOTE — ED Notes (Signed)
Patient c/o cough and chest congestion for 4 days.  Patient denies fevers.  

## 2015-04-09 NOTE — ED Provider Notes (Signed)
CSN: [id number]     Arrival date & time 04/09/15  1332 History   First MD Initiated Contact with Patient 04/09/15 1351    Nurses notes were reviewed. Chief Complaint  Patient presents with  . Cough  . Shortness of Breath   Patient the best historian but states that he's been coughing now short of breath about for 5 days. He reports clear phlegm coming up and states no fever but some chills. 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 He states he does not have a history of COPD or emphysema but doesn't deny that he has chronic lung disease. She states no was told him for sure that he has COPD. He smoked until 2005 or 8 years ago so into the 60s he was smoking. He states that when he was seen here this fall and a chest x-ray was done which was negative but that this is not as bad as was in the fall a question whether he needs another chest x-ray. His pulse ox is 95% today and he is not on oxygen at home. He is not clear of some of the medicines he is on or allergies to medications a yes. He's just finished a nebulizer treatment but states it does seem to help him so.   (Consider location/radiation/quality/duration/timing/severity/associated sxs/prior Treatment) Patient is a 70 y.o. male presenting with cough and shortness of breath. The history is provided by the patient.  Cough Cough characteristics:  Productive Sputum characteristics:  White Severity:  Moderate Progression:  Unable to specify Chronicity:  New Context: smoke exposure, upper respiratory infection and with activity   Context: not animal exposure, not occupational exposure, not sick contacts and not weather changes   Relieved by:  Nothing Ineffective treatments:  None tried Associated symptoms: shortness of breath   Risk factors: no chemical exposure and no recent infection   Shortness of Breath Associated symptoms: cough     History reviewed. No pertinent past medical  history. Past Surgical History  Procedure Laterality Date  . Lung removal, partial  2005    removed 20% of right lung   History reviewed. No pertinent family history. Social History  Substance Use Topics  . Smoking status: Former Smoker    Quit date: 10/04/2003  . Smokeless tobacco: None  . Alcohol Use: No    Review of Systems  HENT: Positive for congestion.   Respiratory: Positive for cough and shortness of breath.   Neurological: Positive for dizziness.  All other systems reviewed and are negative.   Allergies  Alpha blocker quinazolines; Clindamycin/lincomycin; Levaquin; and Penicillins  Home Medications   Prior to Admission medications   Medication Sig Start Date End Date Taking? Authorizing Provider  cetirizine (ZYRTEC) 10 MG tablet Take 10 mg by mouth daily.   Yes Historical Provider, MD  albuterol (PROVENTIL HFA;VENTOLIN HFA) 108 (90 Base) MCG/ACT inhaler Inhale 2 puffs into the lungs every 4 (four) hours as needed for wheezing or shortness of breath. 04/09/15   Hassan Rowan, MD  azithromycin (ZITHROMAX Z-PAK) 250 MG tablet 2 tabs po once day 1, then 1 tab po qd for next 4 days 08/10/14   Payton Mccallum, MD  azithromycin (ZITHROMAX Z-PAK) 250 MG tablet Take 2 tablets first day and then 1 po a day for 4 days 04/09/15   Hassan Rowan, MD  chlorpheniramine-HYDROcodone [facility] PENNKINETIC ER) 10-8 MG/5ML SUER Take 5 mLs by mouth every 12 (twelve) hours as needed for cough. 08/10/14   Orlando  Conty, MD  chlorpheniramine-HYDROcodone (TUSSIONEX PENNKINETIC ER) 10-8 MG/5ML SUER Take 5 mLs by mouth every 12 (twelve) hours as needed for cough. 04/09/15   Hassan RowanEugene Trayquan Kolakowski, MD  dextromethorphan-guaiFENesin (ROBITUSSIN-DM) 10-100 MG/5ML liquid Take by mouth every 4 (four) hours as needed for cough.    Historical Provider, MD  fluticasone (FLONASE) 50 MCG/ACT nasal spray Place into both nostrils daily.    Historical Provider, MD  guaiFENesin (MUCINEX) 600 MG 12 hr tablet Take by mouth 2 (two)  times daily.    Historical Provider, MD  omeprazole (PRILOSEC) 40 MG capsule Take 40 mg by mouth daily.    Historical Provider, MD  predniSONE (STERAPRED UNI-PAK 21 TAB) 10 MG (21) TBPK tablet Sig 6 tablet day 1, 5 tablets day 2, 4 tablets day 3,,3tablets day 4, 2 tablets day 5, 1 tablet day 6 take all tablets orally 04/09/15   Hassan RowanEugene Shell Blanchette, MD   Meds Ordered and Administered this Visit   Medications  ipratropium-albuterol (DUONEB) 0.5-2.5 (3) MG/3ML nebulizer solution 3 mL (3 mLs Nebulization Given 04/09/15 1353)    BP 143/92 mmHg  Pulse 77  Temp(Src) 97 F (36.1 C) (Tympanic)  Resp 17  Ht 5\' 6"  (1.676 m)  Wt 130 lb (58.968 kg)  BMI 20.99 kg/m2  SpO2 100% No data found.   Physical Exam  Constitutional: He appears well-developed.  HENT:  Head: Normocephalic and atraumatic.  Right Ear: External ear normal.  Left Ear: External ear normal.  Mouth/Throat: Oropharynx is clear and moist.  Eyes: Pupils are equal, round, and reactive to light.  Neck: Normal range of motion. Neck supple. No tracheal deviation present.  Cardiovascular: Normal rate, regular rhythm and normal heart sounds.   Pulmonary/Chest: Effort normal. No respiratory distress. He has wheezes in the right upper field, the right middle field, the right lower field, the left upper field and the left lower field.  Musculoskeletal: Normal range of motion.  Neurological: He is alert.  Skin: Skin is warm and dry.  Vitals reviewed.   ED Course  Procedures (including critical care time)  Labs Review Labs Reviewed - No data to display  Imaging Review Dg Chest 2 View  04/09/2015  CLINICAL DATA:  Productive cough x 4-5 days EXAM: CHEST  2 VIEW COMPARISON:  08/10/2014 FINDINGS: Postsurgical changes in the right lung apex. Associated volume loss. Stable blunting of the right costophrenic angle. Left lung is clear. The heart is normal in size. Visualized osseous structures are within normal limits. IMPRESSION: Stable  postsurgical changes in the right lung apex. No evidence of acute cardiopulmonary disease. Electronically Signed   By: Charline BillsSriyesh  Krishnan M.D.   On: 04/09/2015 14:30     Visual Acuity Review  Right Eye Distance:   Left Eye Distance:   Bilateral Distance:    Right Eye Near:   Left Eye Near:    Bilateral Near:         MDM   1. Acute bronchitis, unspecified organism   2. URI (upper respiratory infection)    Patient chest x-ray did not show pneumonia showed the course previous changes on the right side of his lungs. He is appears be breathing better since he's had the DuoNeb treatment. Will allow him to follow-up with his PCP as needed and albuterol inhaler Tussionex 1 teaspoon twice a day Z-Pak records [place] use before and also will place him on 6 day course of prednisone as well. Follow-up as needed.   Note: This dictation was prepared with Dragon dictation along with smaller  Lobbyist. Any transcriptional errors that result from this process are unintentional.   Hassan Rowan, MD 04/09/15 1456

## 2015-05-09 ENCOUNTER — Ambulatory Visit
Admission: EM | Admit: 2015-05-09 | Discharge: 2015-05-09 | Disposition: A | Discharge status: Home / Self Care | Payer: Medicare Other | Attending: Family Medicine | Admitting: Family Medicine

## 2015-05-09 ENCOUNTER — Ambulatory Visit: Payer: Medicare Other

## 2015-05-09 ENCOUNTER — Encounter: Payer: Self-pay | Admitting: *Deleted

## 2015-05-09 DIAGNOSIS — J441 Chronic obstructive pulmonary disease with (acute) exacerbation: Secondary | ICD-10-CM | POA: Diagnosis not present

## 2015-05-09 DIAGNOSIS — J209 Acute bronchitis, unspecified: Secondary | ICD-10-CM | POA: Diagnosis not present

## 2015-05-09 HISTORY — DX: Chronic obstructive pulmonary disease, unspecified: J44.9

## 2015-05-09 MED ORDER — IPRATROPIUM-ALBUTEROL 0.5-2.5 (3) MG/3ML IN SOLN
3.0000 mL | Freq: Once | RESPIRATORY_TRACT | Status: AC
  Administered 2015-05-09: 3 mL via RESPIRATORY_TRACT

## 2015-05-09 MED ORDER — PREDNISONE 10 MG (21) PO TBPK: ORAL_TABLET | ORAL | Status: DC

## 2015-05-09 MED ORDER — BENZONATATE 200 MG PO CAPS: 200.0000 mg | ORAL_CAPSULE | Freq: Three times a day (TID) | ORAL | Status: DC | PRN

## 2015-05-09 MED ORDER — ALBUTEROL SULFATE HFA 108 (90 BASE) MCG/ACT IN AERS: 2.0000 | INHALATION_SPRAY | RESPIRATORY_TRACT | Status: DC | PRN

## 2015-05-09 MED ORDER — DOXYCYCLINE HYCLATE 100 MG PO CAPS: 100.0000 mg | ORAL_CAPSULE | Freq: Two times a day (BID) | ORAL | Status: DC

## 2015-05-09 NOTE — ED Provider Notes (Signed)
CSN: [id number]     Arrival date & time 05/09/15  1730 History   First MD Initiated Contact with Patient 05/09/15 1905    Nurses notes were reviewed. Chief Complaint  Patient presents with  . Cough  . Nasal Congestion  . Shortness of Breath   Patient was seen about a month ago. He has a history quite extensive lung disease labs that ruptured chest tubes and removal problems lungs. He states that the Zithromax that I placed him on in March 11 helped for a while but then the cold and congestion came back. He was put on Zithromax because his chest x-ray was negative for pneumonia and he wanted the medicine he received here in August by Dr. Izell Wilton which was a Z-Pak. He states that the cough medicine the test next couple months she still has some he also thought the prednisone helped her much. The congestion has progressively gotten worse over the last week. Offered to get chest x-ray but we x-rayed him today he was here March he doesn't think he has pneumonia yeast feels he just needs another round of antibiotic. He did have a DuoNeb treatment last time he was here he is grossly allowed [place] to give him another DuoNeb treatment today.   He is a former smoker stopped in 2005 he has a history of COPD and stable above he's On his lung removed 20% of his right lungs. He is allergic to Levaquin and clindamycin penicillins and off blockers. No pertinent family medical history related to this visit  (Consider location/radiation/quality/duration/timing/severity/associated sxs/prior Treatment) HPI  Past Medical History  Diagnosis Date  . COPD (chronic obstructive pulmonary disease) [facility])    Past Surgical History  Procedure Laterality Date  . Lung removal, partial  2005    removed 20% of right lung   History reviewed. No pertinent family history. Social History  Substance Use Topics  . Smoking status: Former Smoker    Quit date: 10/04/2003  . Smokeless tobacco: None  . Alcohol Use: No    Review of  Systems  HENT: Positive for congestion.   Respiratory: Positive for cough, shortness of breath and wheezing.   Cardiovascular: Negative for chest pain.  All other systems reviewed and are negative.   Allergies  Alpha blocker quinazolines; Clindamycin/lincomycin; Levaquin; and Penicillins  Home Medications   Prior to Admission medications   Medication Sig Start Date End Date Taking? Authorizing Provider  albuterol (PROVENTIL HFA;VENTOLIN HFA) 108 (90 Base) MCG/ACT inhaler Inhale 2 puffs into the lungs every 4 (four) hours as needed for wheezing or shortness of breath. 04/09/15  Yes Hassan Rowan, MD  azithromycin (ZITHROMAX Z-PAK) 250 MG tablet 2 tabs po once day 1, then 1 tab po qd for next 4 days 08/10/14  Yes Payton Mccallum, MD  azithromycin (ZITHROMAX Z-PAK) 250 MG tablet Take 2 tablets first day and then 1 po a day for 4 days 04/09/15  Yes Hassan Rowan, MD  cetirizine (ZYRTEC) 10 MG tablet Take 10 mg by mouth daily.   Yes Historical Provider, MD  chlorpheniramine-HYDROcodone (TUSSIONEX PENNKINETIC ER) 10-8 MG/5ML SUER Take 5 mLs by mouth every 12 (twelve) hours as needed for cough. 08/10/14  Yes Payton Mccallum, MD  chlorpheniramine-HYDROcodone (TUSSIONEX PENNKINETIC ER) 10-8 MG/5ML SUER Take 5 mLs by mouth every 12 (twelve) hours as needed for cough. 04/09/15  Yes Hassan Rowan, MD  omeprazole (PRILOSEC) 40 MG capsule Take 40 mg by mouth daily.   Yes Historical Provider, MD  albuterol (PROVENTIL HFA;VENTOLIN HFA) 108 (  90 Base) MCG/ACT inhaler Inhale 2 puffs into the lungs every 4 (four) hours as needed for wheezing or shortness of breath. 05/09/15   Hassan RowanEugene Jacobs Golab, MD  benzonatate (TESSALON) 200 MG capsule Take 1 capsule (200 mg total) by mouth 3 (three) times daily as needed for cough. 05/09/15   Hassan RowanEugene Kendalynn Wideman, MD  dextromethorphan-guaiFENesin (ROBITUSSIN-DM) 10-100 MG/5ML liquid Take by mouth every 4 (four) hours as needed for cough.    Historical Provider, MD  doxycycline (VIBRAMYCIN) 100 MG capsule Take  1 capsule (100 mg total) by mouth 2 (two) times daily. 05/09/15   Hassan RowanEugene Mccayla Shimada, MD  fluticasone (FLONASE) 50 MCG/ACT nasal spray Place into both nostrils daily.    Historical Provider, MD  guaiFENesin (MUCINEX) 600 MG 12 hr tablet Take by mouth 2 (two) times daily.    Historical Provider, MD  predniSONE (STERAPRED UNI-PAK 21 TAB) 10 MG (21) TBPK tablet Sig 6 tablet day 1, 5 tablets day 2, 4 tablets day 3,,3tablets day 4, 2 tablets day 5, 1 tablet day 6 take all tablets orally 05/09/15   Hassan RowanEugene Breken Nazari, MD   Meds Ordered and Administered this Visit   Medications  ipratropium-albuterol (DUONEB) 0.5-2.5 (3) MG/3ML nebulizer solution 3 mL (3 mLs Nebulization Given 05/09/15 1918)    BP 118/69 mmHg  Pulse 80  Temp(Src) 97.8 F (36.6 C) (Oral)  Resp 20  Ht 5\' 6"  (1.676 m)  Wt 130 lb (58.968 kg)  BMI 20.99 kg/m2  SpO2 98% No data found.   Physical Exam  Constitutional: He appears well-developed.  HENT:  Head: Normocephalic and atraumatic.  Eyes: Conjunctivae are normal. Pupils are equal, round, and reactive to light.  Neck: Normal range of motion. Neck supple. No tracheal deviation present.  Cardiovascular: Normal rate and regular rhythm.   Pulmonary/Chest: He has decreased breath sounds.  Musculoskeletal: Normal range of motion.  Neurological: He is alert.  Skin: Skin is warm.  Psychiatric: He has a normal mood and affect.  Vitals reviewed.   ED Course  Procedures (including critical care time)  Labs Review Labs Reviewed - No data to display  Imaging Review No results found.   Visual Acuity Review  Right Eye Distance:   Left Eye Distance:   Bilateral Distance:    Right Eye Near:   Left Eye Near:    Bilateral Near:         MDM   1. Acute exacerbation of chronic obstructive pulmonary disease (COPD) (HCC)   2. Bronchitis, acute, with bronchospasm    We'll give a breathing treatment form of a DuoNeb. We'll place on doxycycline 100 mg twice a day to just patient is  bronchitis renew albuterol inhaler as per his request and since the testing symptoms seem to help we'll place him on Tessalon Perles 80 mg 1 capsule 3 times a day. Follow-up PCP please no week not better.    Hassan RowanEugene Falon Huesca, MD 05/09/15 [phone]

## 2015-05-09 NOTE — Discharge Instructions (Signed)
Acute Bronchitis °Bronchitis is when the airways that extend from the windpipe into the lungs get red, puffy, and painful (inflamed). Bronchitis often causes thick spit (mucus) to develop. This leads to a cough. A cough is the most common symptom of bronchitis. °In acute bronchitis, the condition usually begins suddenly and goes away over time (usually in 2 weeks). Smoking, allergies, and asthma can make bronchitis worse. Repeated episodes of bronchitis may cause more lung problems. °HOME CARE °· Rest. °· Drink enough fluids to keep your pee (urine) clear or pale yellow (unless you need to limit fluids as told by your doctor). °· Only take over-the-counter or prescription medicines as told by your doctor. °· Avoid smoking and secondhand smoke. These can make bronchitis worse. If you are a smoker, think about using nicotine gum or skin patches. Quitting smoking will help your lungs heal faster. °· Reduce the chance of getting bronchitis again by: °¨ Washing your hands often. °¨ Avoiding people with cold symptoms. °¨ Trying not to touch your hands to your mouth, nose, or eyes. °· Follow up with your doctor as told. °GET HELP IF: °Your symptoms do not improve after 1 week of treatment. Symptoms include: °· Cough. °· Fever. °· Coughing up thick spit. °· Body aches. °· Chest congestion. °· Chills. °· Shortness of breath. °· Sore throat. °GET HELP RIGHT AWAY IF:  °· You have an increased fever. °· You have chills. °· You have severe shortness of breath. °· You have bloody thick spit (sputum). °· You throw up (vomit) often. °· You lose too much body fluid (dehydration). °· You have a severe headache. °· You faint. °MAKE SURE YOU:  °· Understand these instructions. °· Will watch your condition. °· Will get help right away if you are not doing well or get worse. °  °This information is not intended to replace advice given to you by your health care provider. Make sure you discuss any questions you have with your health care  provider. °  °Document Released: 07/04/2007 Document Revised: 09/17/2012 Document Reviewed: 07/08/2012 °Elsevier Interactive Patient Education ©2016 [employer] ° °Chronic Obstructive Pulmonary Disease Exacerbation °Chronic obstructive pulmonary disease (COPD) is a common lung problem. In COPD, the flow of air from the lungs is limited. COPD exacerbations are times that breathing gets worse and you need extra treatment. Without treatment they can be life threatening. If they happen often, your lungs can become more damaged. If your COPD gets worse, your doctor may treat you with: °· Medicines. °· Oxygen. °· Different ways to clear your airway, such as using a mask. °HOME CARE °· Do not smoke. °· Avoid tobacco smoke and other things that bother your lungs. °· If given, take your antibiotic medicine as told. Finish the medicine even if you start to feel better. °· Only take medicines as told by your doctor. °· Drink enough fluids to keep your pee (urine) clear or pale yellow (unless your doctor has told you not to). °· Use a cool mist machine (vaporizer). °· If you use oxygen or a machine that turns liquid medicine into a mist (nebulizer), continue to use them as told. °· Keep up with shots (vaccinations) as told by your doctor. °· Exercise regularly. °· Eat healthy foods. °· Keep all doctor visits as told. °GET HELP RIGHT AWAY IF: °· You are very short of breath and it gets worse. °· You have trouble talking. °· You have bad chest pain. °· You have blood in your spit (sputum). °·   You have a fever. °· You keep throwing up (vomiting). °· You feel weak, or you pass out (faint). °· You feel confused. °· You keep getting worse. °MAKE SURE YOU: °· Understand these instructions. °· Will watch your condition. °· Will get help right away if you are not doing well or get worse. °  °This information is not intended to replace advice given to you by your health care provider. Make sure you discuss any questions you have with  your health care provider. °  °Document Released: 01/04/2011 Document Revised: 02/05/2014 Document Reviewed: 09/19/2012 °Elsevier Interactive Patient Education ©2016 [employer] ° °Cough, Adult °A cough helps to clear your throat and lungs. A cough may last only 2-3 weeks (acute), or it may last longer than 8 weeks (chronic). Many different things can cause a cough. A cough may be a sign of an illness or another medical condition. °HOME CARE °· Pay attention to any changes in your cough. °· Take medicines only as told by your doctor. °¨ If you were prescribed an antibiotic medicine, take it as told by your doctor. Do not stop taking it even if you start to feel better. °¨ Talk with your doctor before you try using a cough medicine. °· Drink enough fluid to keep your pee (urine) clear or pale yellow. °· If the air is dry, use a cold steam vaporizer or humidifier in your home. °· Stay away from things that make you cough at work or at home. °· If your cough is worse at night, try using extra pillows to raise your head up higher while you sleep. °· Do not smoke, and try not to be around smoke. If you need help quitting, ask your doctor. °· Do not have caffeine. °· Do not drink alcohol. °· Rest as needed. °GET HELP IF: °· You have new problems (symptoms). °· You cough up yellow fluid (pus). °· Your cough does not get better after 2-3 weeks, or your cough gets worse. °· Medicine does not help your cough and you are not sleeping well. °· You have pain that gets worse or pain that is not helped with medicine. °· You have a fever. °· You are losing weight and you do not know why. °· You have night sweats. °GET HELP RIGHT AWAY IF: °· You cough up blood. °· You have trouble breathing. °· Your heartbeat is very fast. °  °This information is not intended to replace advice given to you by your health care provider. Make sure you discuss any questions you have with your health care provider. °  °Document Released: 09/28/2010  Document Revised: 10/06/2014 Document Reviewed: 03/24/2014 °Elsevier Interactive Patient Education ©2016 [employer] ° °

## 2015-05-09 NOTE — ED Notes (Signed)
Seen here on the 11th of March for URI type symptoms. Here today c/o productive cough- clear, dyspnea, and runny nose. 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

## 2015-05-17 DIAGNOSIS — J441 Chronic obstructive pulmonary disease with (acute) exacerbation: Secondary | ICD-10-CM | POA: Diagnosis not present

## 2015-06-08 DIAGNOSIS — M9903 Segmental and somatic dysfunction of lumbar region: Secondary | ICD-10-CM | POA: Diagnosis not present

## 2015-06-08 DIAGNOSIS — M5136 Other intervertebral disc degeneration, lumbar region: Secondary | ICD-10-CM | POA: Diagnosis not present

## 2015-06-08 DIAGNOSIS — M9905 Segmental and somatic dysfunction of pelvic region: Secondary | ICD-10-CM | POA: Diagnosis not present

## 2015-06-08 DIAGNOSIS — M955 Acquired deformity of pelvis: Secondary | ICD-10-CM | POA: Diagnosis not present

## 2015-07-29 DIAGNOSIS — K648 Other hemorrhoids: Secondary | ICD-10-CM | POA: Diagnosis not present

## 2015-07-29 DIAGNOSIS — K5793 Diverticulitis of intestine, part unspecified, without perforation or abscess with bleeding: Secondary | ICD-10-CM | POA: Diagnosis not present

## 2015-08-29 DIAGNOSIS — M9905 Segmental and somatic dysfunction of pelvic region: Secondary | ICD-10-CM | POA: Diagnosis not present

## 2015-08-29 DIAGNOSIS — M5136 Other intervertebral disc degeneration, lumbar region: Secondary | ICD-10-CM | POA: Diagnosis not present

## 2015-08-29 DIAGNOSIS — M9903 Segmental and somatic dysfunction of lumbar region: Secondary | ICD-10-CM | POA: Diagnosis not present

## 2015-08-29 DIAGNOSIS — M955 Acquired deformity of pelvis: Secondary | ICD-10-CM | POA: Diagnosis not present

## 2015-11-29 DIAGNOSIS — M9905 Segmental and somatic dysfunction of pelvic region: Secondary | ICD-10-CM | POA: Diagnosis not present

## 2015-11-29 DIAGNOSIS — M9903 Segmental and somatic dysfunction of lumbar region: Secondary | ICD-10-CM | POA: Diagnosis not present

## 2015-11-29 DIAGNOSIS — M5136 Other intervertebral disc degeneration, lumbar region: Secondary | ICD-10-CM | POA: Diagnosis not present

## 2015-11-29 DIAGNOSIS — M955 Acquired deformity of pelvis: Secondary | ICD-10-CM | POA: Diagnosis not present

## 2016-04-28 ENCOUNTER — Encounter: Payer: Self-pay | Admitting: Emergency Medicine

## 2016-04-28 ENCOUNTER — Ambulatory Visit
Admission: EM | Admit: 2016-04-28 | Discharge: 2016-04-28 | Disposition: A | Discharge status: Home / Self Care | Payer: Medicare Other | Attending: Family Medicine | Admitting: Family Medicine

## 2016-04-28 DIAGNOSIS — J441 Chronic obstructive pulmonary disease with (acute) exacerbation: Secondary | ICD-10-CM

## 2016-04-28 DIAGNOSIS — N410 Acute prostatitis: Secondary | ICD-10-CM | POA: Diagnosis not present

## 2016-04-28 DIAGNOSIS — K644 Residual hemorrhoidal skin tags: Secondary | ICD-10-CM | POA: Diagnosis not present

## 2016-04-28 HISTORY — DX: Diverticulitis of intestine, part unspecified, without perforation or abscess without bleeding: K57.92

## 2016-04-28 MED ORDER — PREDNISONE 20 MG PO TABS: ORAL_TABLET | ORAL | 0 refills | Status: DC

## 2016-04-28 MED ORDER — CIPROFLOXACIN HCL 250 MG PO TABS: 250.0000 mg | ORAL_TABLET | Freq: Two times a day (BID) | ORAL | 0 refills | Status: DC

## 2016-04-28 MED ORDER — IPRATROPIUM-ALBUTEROL 0.5-2.5 (3) MG/3ML IN SOLN
3.0000 mL | Freq: Once | RESPIRATORY_TRACT | Status: AC
  Administered 2016-04-28: 3 mL via RESPIRATORY_TRACT

## 2016-04-28 NOTE — ED Triage Notes (Signed)
Patient c/o rectal bleeding that started 3 months ago.  Patient reports SOB for several months.  Patient reports history of Bronchitis and COPD.  Patient c/o ongoing cough and chest congestion.

## 2016-04-28 NOTE — ED Provider Notes (Signed)
[facility]    CSN: [id number] Arrival date & time: 04/28/16  0804     History   Chief Complaint Chief Complaint  Patient presents with  . Rectal Bleeding  . Shortness of Breath    HPI Charles Webster is a 71 y.o. male.   71 yo male with a c/o mild, chronic, intermittent burning with to rectal area when urinating, intermittent rectal bleeding and pain with bowel movements. Symptoms have been present "for 3 months". Denies any fevers, chills, abdominal pain, dysuria, hematuria. Also c/o 1 week of worsening cough and wheezing with shortness of breath with inhalers helping some.    The history is provided by the patient.  Rectal Bleeding  Shortness of Breath    Past Medical History:  Diagnosis Date  . COPD (chronic obstructive pulmonary disease) (HCC)   . Diverticulitis     There are no active problems to display for this patient.   Past Surgical History:  Procedure Laterality Date  . LUNG REMOVAL, PARTIAL  2005   removed 20% of right lung       Home Medications    Prior to Admission medications   Medication Sig Start Date End Date Taking? Authorizing Provider  albuterol (PROVENTIL HFA;VENTOLIN HFA) 108 (90 Base) MCG/ACT inhaler Inhale 2 puffs into the lungs every 4 (four) hours as needed for wheezing or shortness of breath. 05/09/15   Hassan Rowan, MD  cetirizine (ZYRTEC) 10 MG tablet Take 10 mg by mouth daily.    Historical Provider, MD  ciprofloxacin (CIPRO) 250 MG tablet Take 1 tablet (250 mg total) by mouth every 12 (twelve) hours. 04/28/16   Payton Mccallum, MD  omeprazole (PRILOSEC) 40 MG capsule Take 40 mg by mouth daily.    Historical Provider, MD  predniSONE (DELTASONE) 20 MG tablet 3 tabs po qd for 2 days, then 2 tabs po qd for 3 days, then 1 tab po qd for 3 days, then half a tab po qd for 2 days 04/28/16   Payton Mccallum, MD    Family History History reviewed. No pertinent family history.  Social History Social History  Substance Use Topics    . Smoking status: Former Smoker    Quit date: 10/04/2003  . Smokeless tobacco: Never Used  . Alcohol use No     Allergies   Alpha blocker quinazolines; Clindamycin/lincomycin; Levaquin [levofloxacin]; and Penicillins   Review of Systems Review of Systems  Respiratory: Positive for shortness of breath.   Gastrointestinal: Positive for hematochezia.     Physical Exam Triage Vital Signs ED Triage Vitals  Enc Vitals Group     BP 04/28/16 0818 (!) 143/89     Pulse Rate 04/28/16 0818 78     Resp 04/28/16 0818 16     Temp 04/28/16 0818 97.7 F (36.5 C)     Temp Source 04/28/16 0818 Oral     SpO2 04/28/16 0818 100 %     Weight 04/28/16 0816 130 lb (59 kg)     Height 04/28/16 0816  (1.676 m)     Head Circumference --      Peak Flow --      Pain Score 04/28/16 0816 3     Pain Loc --      Pain Edu? --      Excl. in GC? --    No data found.   Updated Vital Signs BP (!) 143/89 (BP Location: Left Arm)   Pulse 78   Temp 97.7 F (36.5 C) (  Oral)   Resp 16   Ht  (1.676 m)   Wt 130 lb (59 kg)   SpO2 100%   BMI 20.98 kg/m   Visual Acuity Right Eye Distance:   Left Eye Distance:   Bilateral Distance:    Right Eye Near:   Left Eye Near:    Bilateral Near:     Physical Exam  Constitutional: He appears well-developed and well-nourished. No distress.  HENT:  Head: Normocephalic and atraumatic.  Right Ear: External ear normal.  Left Ear: External ear normal.  Nose: Nose normal.  Mouth/Throat: Uvula is midline, oropharynx is clear and moist and mucous membranes are normal. No oropharyngeal exudate or tonsillar abscesses.  Eyes: Conjunctivae and EOM are normal. Pupils are equal, round, and reactive to light. Right eye exhibits no discharge. Left eye exhibits no discharge. No scleral icterus.  Neck: Normal range of motion. Neck supple. No tracheal deviation present. No thyromegaly present.  Cardiovascular: Normal rate, regular rhythm and normal heart sounds.    Pulmonary/Chest: Effort normal. No stridor. No respiratory distress. He has wheezes (diffusely and rhonchi). He has no rales. He exhibits no tenderness.  Abdominal: Soft. Bowel sounds are normal. He exhibits no distension and no mass. There is no tenderness. There is no rebound and no guarding.  Genitourinary: Rectal exam shows external hemorrhoid. Prostate is enlarged and tender (and boggy).  Lymphadenopathy:    He has no cervical adenopathy.  Neurological: He is alert.  Skin: Skin is warm and dry. No rash noted. He is not diaphoretic.  Nursing note and vitals reviewed.    UC Treatments / Results  Labs (all labs ordered are listed, but only abnormal results are displayed) Labs Reviewed - No data to display  EKG  EKG Interpretation None       Radiology No results found.  Procedures Procedures (including critical care time)  Medications Ordered in UC Medications  ipratropium-albuterol (DUONEB) 0.5-2.5 (3) MG/3ML nebulizer solution 3 mL (3 mLs Nebulization Given 04/28/16 0902)     Initial Impression / Assessment and Plan / UC Course  I have reviewed the triage vital signs and the nursing notes.  Pertinent labs & imaging results that were available during my care of the patient were reviewed by me and considered in my medical decision making (see chart for details).       Final Clinical Impressions(s) / UC Diagnoses   Final diagnoses:  COPD exacerbation (HCC)  Acute prostatitis  External hemorrhoid    New Prescriptions Discharge Medication List as of 04/28/2016  9:21 AM    START taking these medications   Details  ciprofloxacin (CIPRO) 250 MG tablet Take 1 tablet (250 mg total) by mouth every 12 (twelve) hours., Starting Sat 04/28/2016, Normal    predniSONE (DELTASONE) 20 MG tablet 3 tabs po qd for 2 days, then 2 tabs po qd for 3 days, then 1 tab po qd for 3 days, then half a tab po qd for 2 days, Normal       1. diagnosis reviewed with patient 2. rx as per  orders above; reviewed possible side effects, interactions, risks and benefits  3. Patient given duoneb treatment x 1 with improvement of wheezing 4. Recommend supportive treatment with increased fluids, sitz baths, rest 5. Follow-up prn if symptoms worsen or don't improve   Payton Mccallum, MD 04/28/16 [phone]

## 2016-05-05 ENCOUNTER — Encounter: Payer: Self-pay | Admitting: Emergency Medicine

## 2016-05-05 ENCOUNTER — Encounter: Payer: Self-pay | Admitting: Gynecology

## 2016-05-05 ENCOUNTER — Inpatient Hospital Stay
Admission: EM | Admit: 2016-05-05 | Discharge: 2016-05-06 | DRG: 191 | Disposition: A | Discharge status: Home / Self Care | Payer: Medicare Other | Attending: Internal Medicine | Admitting: Internal Medicine

## 2016-05-05 ENCOUNTER — Emergency Department: Payer: Medicare Other

## 2016-05-05 ENCOUNTER — Ambulatory Visit (INDEPENDENT_AMBULATORY_CARE_PROVIDER_SITE_OTHER)
Admission: EM | Admit: 2016-05-05 | Discharge: 2016-05-05 | Disposition: A | Discharge status: Home / Self Care | Payer: Medicare Other | Source: Home / Self Care | Attending: Emergency Medicine | Admitting: Emergency Medicine

## 2016-05-05 ENCOUNTER — Ambulatory Visit: Payer: Medicare Other

## 2016-05-05 DIAGNOSIS — Z7951 Long term (current) use of inhaled steroids: Secondary | ICD-10-CM | POA: Diagnosis not present

## 2016-05-05 DIAGNOSIS — Z79899 Other long term (current) drug therapy: Secondary | ICD-10-CM | POA: Diagnosis not present

## 2016-05-05 DIAGNOSIS — Z888 Allergy status to other drugs, medicaments and biological substances status: Secondary | ICD-10-CM | POA: Diagnosis not present

## 2016-05-05 DIAGNOSIS — N419 Inflammatory disease of prostate, unspecified: Secondary | ICD-10-CM | POA: Diagnosis present

## 2016-05-05 DIAGNOSIS — R0602 Shortness of breath: Secondary | ICD-10-CM

## 2016-05-05 DIAGNOSIS — K219 Gastro-esophageal reflux disease without esophagitis: Secondary | ICD-10-CM | POA: Diagnosis present

## 2016-05-05 DIAGNOSIS — Z880 Allergy status to penicillin: Secondary | ICD-10-CM | POA: Diagnosis not present

## 2016-05-05 DIAGNOSIS — R05 Cough: Secondary | ICD-10-CM

## 2016-05-05 DIAGNOSIS — J441 Chronic obstructive pulmonary disease with (acute) exacerbation: Principal | ICD-10-CM | POA: Diagnosis present

## 2016-05-05 DIAGNOSIS — T380X5A Adverse effect of glucocorticoids and synthetic analogues, initial encounter: Secondary | ICD-10-CM | POA: Diagnosis present

## 2016-05-05 DIAGNOSIS — Z87891 Personal history of nicotine dependence: Secondary | ICD-10-CM

## 2016-05-05 DIAGNOSIS — D72829 Elevated white blood cell count, unspecified: Secondary | ICD-10-CM | POA: Diagnosis present

## 2016-05-05 DIAGNOSIS — Z881 Allergy status to other antibiotic agents status: Secondary | ICD-10-CM

## 2016-05-05 DIAGNOSIS — B370 Candidal stomatitis: Secondary | ICD-10-CM | POA: Diagnosis not present

## 2016-05-05 DIAGNOSIS — J449 Chronic obstructive pulmonary disease, unspecified: Secondary | ICD-10-CM | POA: Diagnosis present

## 2016-05-05 DIAGNOSIS — Z902 Acquired absence of lung [part of]: Secondary | ICD-10-CM

## 2016-05-05 HISTORY — DX: Bronchitis, not specified as acute or chronic: J40

## 2016-05-05 LAB — CBC WITH DIFFERENTIAL/PLATELET
Basophils Absolute: 0 10*3/uL (ref 0–0.1)
Basophils Relative: 0 %
Eosinophils Absolute: 0 10*3/uL (ref 0–0.7)
Eosinophils Relative: 0 %
HEMATOCRIT: 41.2 % (ref 40.0–52.0)
HEMOGLOBIN: 14.2 g/dL (ref 13.0–18.0)
LYMPHS ABS: 0.7 10*3/uL — AB (ref 1.0–3.6)
LYMPHS PCT: 4 %
MCH: 31.2 pg (ref 26.0–34.0)
MCHC: 34.5 g/dL (ref 32.0–36.0)
MCV: 90.4 fL (ref 80.0–100.0)
Monocytes Absolute: 1.4 10*3/uL — ABNORMAL HIGH (ref 0.2–1.0)
Monocytes Relative: 7 %
NEUTROS PCT: 89 %
Neutro Abs: 16.9 10*3/uL — ABNORMAL HIGH (ref 1.4–6.5)
Platelets: 317 10*3/uL (ref 150–440)
RBC: 4.56 MIL/uL (ref 4.40–5.90)
RDW: 13.9 % (ref 11.5–14.5)
WBC: 19 10*3/uL — AB (ref 3.8–10.6)

## 2016-05-05 LAB — COMPREHENSIVE METABOLIC PANEL
ALK PHOS: 70 U/L (ref 38–126)
ALT: 18 U/L (ref 17–63)
AST: 25 U/L (ref 15–41)
Albumin: 3.9 g/dL (ref 3.5–5.0)
Anion gap: 8 (ref 5–15)
BUN: 22 mg/dL — ABNORMAL HIGH (ref 6–20)
CALCIUM: 8.9 mg/dL (ref 8.9–10.3)
CO2: 27 mmol/L (ref 22–32)
CREATININE: 0.84 mg/dL (ref 0.61–1.24)
Chloride: 101 mmol/L (ref 101–111)
GFR calc non Af Amer: >60 mL/min (ref >60)
Glucose, Bld: 99 mg/dL (ref 65–99)
Potassium: 3.5 mmol/L (ref 3.5–5.1)
Sodium: 136 mmol/L (ref 135–145)
Total Bilirubin: 1.1 mg/dL (ref 0.3–1.2)
Total Protein: 6.9 g/dL (ref 6.5–8.1)

## 2016-05-05 LAB — TROPONIN I: Troponin I: <0.03 ng/mL (ref <0.03)

## 2016-05-05 LAB — BRAIN NATRIURETIC PEPTIDE: B NATRIURETIC PEPTIDE 5: 39 pg/mL (ref 0.0–100.0)

## 2016-05-05 MED ORDER — METHYLPREDNISOLONE SODIUM SUCC 40 MG IJ SOLR
40.0000 mg | Freq: Four times a day (QID) | INTRAMUSCULAR | Status: DC
  Administered 2016-05-05 – 2016-05-06 (×4): 40 mg via INTRAVENOUS
  Filled 2016-05-05 (×4): qty 1

## 2016-05-05 MED ORDER — IPRATROPIUM-ALBUTEROL 0.5-2.5 (3) MG/3ML IN SOLN
3.0000 mL | Freq: Once | RESPIRATORY_TRACT | Status: AC
  Administered 2016-05-05: 3 mL via RESPIRATORY_TRACT

## 2016-05-05 MED ORDER — METHYLPREDNISOLONE SODIUM SUCC 125 MG IJ SOLR
125.0000 mg | Freq: Once | INTRAMUSCULAR | Status: AC
  Administered 2016-05-05: 125 mg via INTRAVENOUS
  Filled 2016-05-05: qty 2

## 2016-05-05 MED ORDER — ONDANSETRON HCL 4 MG PO TABS: 4.0000 mg | ORAL_TABLET | Freq: Four times a day (QID) | ORAL | Status: DC | PRN

## 2016-05-05 MED ORDER — ACETAMINOPHEN 650 MG RE SUPP: 650.0000 mg | Freq: Four times a day (QID) | RECTAL | Status: DC | PRN

## 2016-05-05 MED ORDER — ENOXAPARIN SODIUM 40 MG/0.4ML ~~LOC~~ SOLN
40.0000 mg | SUBCUTANEOUS | Status: DC
  Administered 2016-05-05: 40 mg via SUBCUTANEOUS
  Filled 2016-05-05: qty 0.4

## 2016-05-05 MED ORDER — ACETAMINOPHEN 325 MG PO TABS: 650.0000 mg | ORAL_TABLET | Freq: Four times a day (QID) | ORAL | Status: DC | PRN

## 2016-05-05 MED ORDER — AEROCHAMBER PLUS MISC: 2 refills | Status: DC

## 2016-05-05 MED ORDER — GUAIFENESIN-DM 100-10 MG/5ML PO SYRP: 5.0000 mL | ORAL_SOLUTION | ORAL | Status: DC | PRN

## 2016-05-05 MED ORDER — IPRATROPIUM-ALBUTEROL 0.5-2.5 (3) MG/3ML IN SOLN
3.0000 mL | Freq: Four times a day (QID) | RESPIRATORY_TRACT | Status: DC
  Administered 2016-05-05 – 2016-05-06 (×3): 3 mL via RESPIRATORY_TRACT
  Filled 2016-05-05 (×4): qty 3

## 2016-05-05 MED ORDER — BUDESONIDE 0.5 MG/2ML IN SUSP
0.5000 mg | Freq: Two times a day (BID) | RESPIRATORY_TRACT | Status: DC
  Administered 2016-05-05 – 2016-05-06 (×2): 0.5 mg via RESPIRATORY_TRACT
  Filled 2016-05-05 (×2): qty 2

## 2016-05-05 MED ORDER — PANTOPRAZOLE SODIUM 40 MG PO TBEC
40.0000 mg | DELAYED_RELEASE_TABLET | Freq: Every day | ORAL | Status: DC
Start: 2016-05-06 — End: 2016-05-06
  Administered 2016-05-06: 40 mg via ORAL
  Filled 2016-05-05: qty 1

## 2016-05-05 MED ORDER — IPRATROPIUM-ALBUTEROL 0.5-2.5 (3) MG/3ML IN SOLN
3.0000 mL | Freq: Once | RESPIRATORY_TRACT | Status: AC
  Administered 2016-05-05: 3 mL via RESPIRATORY_TRACT
  Filled 2016-05-05: qty 3

## 2016-05-05 MED ORDER — LORATADINE 10 MG PO TABS
10.0000 mg | ORAL_TABLET | Freq: Every day | ORAL | Status: DC
  Administered 2016-05-06: 10 mg via ORAL
  Filled 2016-05-05: qty 1

## 2016-05-05 MED ORDER — ONDANSETRON HCL 4 MG/2ML IJ SOLN: 4.0000 mg | Freq: Four times a day (QID) | INTRAMUSCULAR | Status: DC | PRN

## 2016-05-05 NOTE — ED Provider Notes (Signed)
HPI  SUBJECTIVE:  Charles Webster is a 71 y.o. male who presents with worsening shortness of breath above his baseline for the past week. He reports a cough that is productive of clear sputum. He states that he is coughing up more sputum than usual but denies a change in the color. He reports wheezing, chest congestion, dyspnea on exertion to 15 feet. He is currently on prednisone taper, and is using his Advair twice a day and pro-air every 4 hours which do not seem to be helping. He denies nausea, vomiting, fevers, posttussive emesis, chest pain, lower extremity edema, unintentional weight gain, orthopnea, PND, abdominal, nocturia. No antipyretic in the past 6-8 hours. He states he he has had identical symptoms before was thought to have a COPD flare versus bronchitis.  He has past medical history of COPD. He is compliant with his Advair and pro air. 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 He has no history of MI, CHF, arrhythmia, A. fib, PE, DVT, anticoagulant or antiplatelet use. PMD: None.  PT seen here on 3/31 for shortness of breath, thought to have a COPD exacerbation, given a DuoNeb with improvement in symptoms.  started on prednisone taper and Cipro 250 mg for prostatitis.   Past Medical History:  Diagnosis Date  . Bronchitis   . COPD (chronic obstructive pulmonary disease) (HCC)   . Diverticulitis     Past Surgical History:  Procedure Laterality Date  . LUNG REMOVAL, PARTIAL  2005   removed 20% of right lung    No family history on file.  Social History  Substance Use Topics  . Smoking status: Former Smoker    Quit date: 10/04/2003  . Smokeless tobacco: Never Used  . Alcohol use No    No current facility-administered medications for this encounter.   Current Outpatient Prescriptions:  .  albuterol (PROAIR HFA) 108 (90 Base) MCG/ACT inhaler, Inhale  into the lungs every 6 (six) hours as needed for wheezing or shortness of breath., Disp: , Rfl:  .  albuterol (PROVENTIL HFA;VENTOLIN HFA) 108 (90 Base) MCG/ACT inhaler, Inhale 2 puffs into the lungs every 4 (four) hours as needed for wheezing or shortness of breath., Disp: 1 Inhaler, Rfl: 0 .  cetirizine (ZYRTEC) 10 MG tablet, Take 10 mg by mouth daily., Disp: , Rfl:  .  Fluticasone-Salmeterol (ADVAIR) 250-50 MCG/DOSE AEPB, Inhale 1 puff into the lungs 2 (two) times daily., Disp: , Rfl:  .  omeprazole (PRILOSEC) 40 MG capsule, Take 40 mg by mouth daily., Disp: , Rfl:  .  predniSONE (DELTASONE) 20 MG tablet, 3 tabs po qd for 2 days, then 2 tabs po qd for 3 days, then 1 tab po qd for 3 days, then half a tab po qd for 2 days, Disp: 16 tablet, Rfl: 0 .  albuterol (PROVENTIL) (2.5 MG/3ML) 0.083% nebulizer solution, Take 2.5 mg by nebulization every 6 (six) hours as needed for wheezing or shortness of breath., Disp: , Rfl:  .  ciprofloxacin (CIPRO) 250 MG tablet, Take 1 tablet (250 mg total) by mouth every 12 (twelve) hours., Disp: 42 tablet, Rfl: 0  Allergies  Allergen Reactions  . Alpha Blocker Quinazolines   . Clindamycin/Lincomycin Nausea Only  . Levaquin [Levofloxacin]   . Penicillins      ROS  As noted in HPI.   Physical Exam  BP 131/87 (BP Location: Left Arm)  Pulse 89   Temp 97.8 F (36.6 C) (Oral)   Resp 16   Wt 130 lb (59 kg)   SpO2 96%   BMI 20.98 kg/m    Constitutional: Well developed, well nourished, no acute distress patient speaking in full sentences Eyes: PERRL, EOMI, conjunctiva normal bilaterally HENT: Normocephalic, atraumatic,mucus membranes moist Neck: No JVD Respiratory: Diffuse crackles and rhonchi throughout all lung fields. Cardiovascular: Normal rate and rhythm, no murmurs, no gallops, no rubs GI: Soft, nondistended, normal bowel sounds, no hepatomegaly or tenderness skin: No rash, skin intact Musculoskeletal: No edema, no tenderness, no  deformities Neurologic: Alert & oriented x 3, CN II-XII grossly intact, no motor deficits, sensation grossly intact Psychiatric: Speech and behavior appropriate   ED Course   Medications  ipratropium-albuterol (DUONEB) 0.5-2.5 (3) MG/3ML nebulizer solution 3 mL (3 mLs Nebulization Given 05/05/16 0838)    Orders Placed This Encounter  Procedures  . DG Chest 2 View    Standing Status:   Standing    Number of Occurrences:   1    Order Specific Question:   Reason for Exam (SYMPTOM  OR DIAGNOSIS REQUIRED)    Answer:   cough SOB x 1 week r/o PNA, pleural effusion ptx pulm edema   No results found for this or any previous visit (from the past 24 hour(s)). Dg Chest 2 View  Result Date: 05/05/2016 CLINICAL DATA:  Patient reports cough with clear sputum and SOB x2-3 months. Reports symptoms have worsened in past 2 days. Hx COPD, partial R lung removal. Former smoker. EXAM: CHEST  2 VIEW COMPARISON:  Chest x-ray dated 04/09/2015. FINDINGS: Heart size and mediastinal contours are normal. Atherosclerotic changes noted at the aortic arch. Stable postsurgical changes at the right lung apex, with associated volume loss. 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 No pleural effusion or pneumothorax seen. No acute or suspicious osseous finding. IMPRESSION: 1. Hyperexpanded lungs indicating COPD/emphysema. 2. Stable postsurgical changes at the right lung apex. 3. Aortic atherosclerosis. 4. No acute findings.  No evidence of pneumonia or pulmonary edema. Electronically Signed   By: Bary Richard M.D.   On: 05/05/2016 09:15    ED Clinical Impression  COPD exacerbation [facility])   ED Assessment/Plan  Reviewed previous notes. As noted in history of present illness.  No Physical evidence of atrial fibrillation or CHF. Presentation consistent with a COPD exacerbation.  Reviewed Chest x-ray, hyperexpansion consistent with COPD/emphysema. No pleural effusion, pneumothorax, pneumonia pulmonary  edema. Stable postsurgical changes at the right lung apex. See radiology report for details  on reevaluation post-nebulizer treatment patient states that he feels a little bit better. Continued rhonchi and crackles throughout all lung fields on auscultation. however, think that he would be an appropriate candidate for transfer to the ED for intense bronchodilators for COPD exacerbation as he has maximized outpatient therapy already. Encouraged patient ago, but he wants to wait and see how he does today. If he is not getting any better by this evening or if he gets worse he agrees to go to the [facility] ED. Feel that this is a reasonable plan as his vitals are normal and he is satting 96% on room air., And is in no respiratory distress. Patient to continue albuterol every 4 hours, Advair twice a day and to finish his prednisone. We'll prescribe a spacer for him. Follow up with the PMD of his choice in 1-2 days. Providing primary care referral list.  Discussed imaging, MDM, plan and followup with patient.  Discussed sn/sx that should prompt return to the ED. Patient agrees with plan.   Meds ordered this encounter  Medications  . albuterol (PROVENTIL) (2.5 MG/3ML) 0.083% nebulizer solution    Sig: Take 2.5 mg by nebulization every 6 (six) hours as needed for wheezing or shortness of breath.  Marland Kitchen albuterol (PROAIR HFA) 108 (90 Base) MCG/ACT inhaler    Sig: Inhale into the lungs every 6 (six) hours as needed for wheezing or shortness of breath.  . Fluticasone-Salmeterol (ADVAIR) 250-50 MCG/DOSE AEPB    Sig: Inhale 1 puff into the lungs 2 (two) times daily.  Marland Kitchen ipratropium-albuterol (DUONEB) 0.5-2.5 (3) MG/3ML nebulizer solution 3 mL    *This clinic note was created using Scientist, clinical (histocompatibility and immunogenetics). Therefore, there may be occasional mistakes despite careful proofreading.  ?   Domenick Gong, MD 05/05/16 1750

## 2016-05-05 NOTE — ED Triage Notes (Signed)
Pt sent from [facility] urgent care for shortness of breath. Pt states that he was given prednisone and inhaler last week but they have not been helping. Pt is able to speak in complete sentences but breathing is labored.

## 2016-05-05 NOTE — ED Notes (Signed)
Patient given meal tray and sprite.  

## 2016-05-05 NOTE — ED Notes (Signed)
Ambulated patient down hall. Patient's sat was 96% and dropped to 92%. Patient stated he felt dizzy and weak in his knees.

## 2016-05-05 NOTE — Discharge Instructions (Signed)
Continue your albuterol every 4 hours. I am also going to give you a spacer to help maximize the amount of medication delivered to your lungs. Make sure he uses with your albuterol. Advair twice a day and finish the prednisone. If you're not getting better by this evening, got to the ER. Otherwise Follow up with one of these primary care physicians below in 2 days.   Here is a list of primary care providers who are taking new patients:  Dr. Elizabeth Sauer [address] [place] [address] [phone]  Dr. Everlene Other [address]  [address]  [place] [address]  [phone]  [facility] [place] [address]  [place] [address]  [phone]

## 2016-05-05 NOTE — H&P (Signed)
[employer] [facility] [place] [facility]   PATIENT NAME: Charles Webster    MR#:  [id number]  DATE OF BIRTH:  [birth year]  DATE OF ADMISSION:  05/05/2016  PRIMARY CARE PHYSICIAN: Sula Rumple, MD   REQUESTING/REFERRING PHYSICIAN: Dr. Minna Antis  CHIEF COMPLAINT:   Chief Complaint  Patient presents with  . Shortness of Breath    HISTORY OF PRESENT ILLNESS:  Charles Webster  is a 71 y.o. male with a known history of COPD, GERD, diverticulitis who presents to the hospital due to progressive shortness of breath over the past week or so. Patient has history of underlying COPD and says that his shortness of breath has been progressively getting worse over the past 3 months but over the past few days to a week he's had significant exertional dyspnea. He went to urgent care and was placed on a prednisone taper despite taking the steroids his shortness of breath has not improved. He admits to a cough which is productive with clear sputum, but no fevers, chills, nausea, vomiting. He admits to some chest tightness when he ties take a deep breath. He denies any recent sick contacts. He presented to the hospital was treated with the some nebulizer treatments but still complained of significant exertional dyspnea and therefore hospitalist services were contacted further treatment and evaluation.  PAST MEDICAL HISTORY:   Past Medical History:  Diagnosis Date  . Bronchitis   . COPD (chronic obstructive pulmonary disease) (HCC)   . Diverticulitis     PAST SURGICAL HISTORY:   Past Surgical History:  Procedure Laterality Date  . LUNG REMOVAL, PARTIAL  2005   removed 20% of right lung    SOCIAL HISTORY:   Social History  Substance Use Topics  . Smoking status: Former Smoker    Packs/day: 1.00    Years: 40.00    Quit date: 10/04/2003  . Smokeless tobacco: Never Used  . Alcohol use No    FAMILY HISTORY:   Family History  Problem Relation Age of Onset  . Heart disease Mother    . Kidney failure Father     DRUG ALLERGIES:   Allergies  Allergen Reactions  . Alpha Blocker Quinazolines   . Clindamycin/Lincomycin Nausea Only  . Levaquin [Levofloxacin]   . Penicillins     REVIEW OF SYSTEMS:   Review of Systems  Constitutional: Negative for fever and weight loss.  HENT: Negative for congestion, nosebleeds and tinnitus.   Eyes: Negative for blurred vision, double vision and redness.  Respiratory: Positive for cough, sputum production, shortness of breath and wheezing. Negative for hemoptysis.   Cardiovascular: Negative for chest pain, orthopnea, leg swelling and PND.  Gastrointestinal: Negative for abdominal pain, diarrhea, melena, nausea and vomiting.  Genitourinary: Negative for dysuria, hematuria and urgency.  Musculoskeletal: Negative for falls and joint pain.  Neurological: Negative for dizziness, tingling, sensory change, focal weakness, seizures, weakness and headaches.  Endo/Heme/Allergies: Negative for polydipsia. Does not bruise/bleed easily.  Psychiatric/Behavioral: Negative for depression and memory loss. The patient is not nervous/anxious.     MEDICATIONS AT HOME:   Prior to Admission medications   Medication Sig Start Date End Date Taking? Authorizing Provider  albuterol (PROVENTIL HFA;VENTOLIN HFA) 108 (90 Base) MCG/ACT inhaler Inhale 2 puffs into the lungs every 4 (four) hours as needed for wheezing or shortness of breath. 05/09/15  Yes Hassan Rowan, MD  albuterol (PROVENTIL) (2.5 MG/3ML) 0.083% nebulizer solution Take 2.5 mg by nebulization every 6 (six) hours as needed for wheezing or shortness  of breath.   Yes Historical Provider, MD  cetirizine (ZYRTEC) 10 MG tablet Take 10 mg by mouth daily.   Yes Historical Provider, MD  ciprofloxacin (CIPRO) 250 MG tablet Take 1 tablet (250 mg total) by mouth every 12 (twelve) hours. 04/28/16  Yes Payton Mccallum, MD  Fluticasone-Salmeterol (ADVAIR) 250-50 MCG/DOSE AEPB Inhale 1 puff into the lungs 2 (two)  times daily.   Yes Historical Provider, MD  omeprazole (PRILOSEC) 40 MG capsule Take 40 mg by mouth daily.   Yes Historical Provider, MD  predniSONE (DELTASONE) 20 MG tablet 3 tabs po qd for 2 days, then 2 tabs po qd for 3 days, then 1 tab po qd for 3 days, then half a tab po qd for 2 days 04/28/16  Yes Payton Mccallum, MD  Spacer/Aero-Holding Chambers (AEROCHAMBER PLUS) inhaler Use as instructed 05/05/16   Domenick Gong, MD      VITAL SIGNS:  Blood pressure 119/80, pulse 80, temperature 98 F (36.7 C), temperature source Oral, resp. rate 17, SpO2 95 %.  PHYSICAL EXAMINATION:  Physical Exam  GENERAL:  71 y.o.-year-old patient lying in bed in no acute distress.  EYES: Pupils equal, round, reactive to light and accommodation. No scleral icterus. Extraocular muscles intact.  HEENT: Head atraumatic, normocephalic. Oropharynx and nasopharynx clear. No oropharyngeal erythema, moist oral mucosa  NECK:  Supple, no jugular venous distention. No thyroid enlargement, no tenderness.  LUNGS: Normal breath sounds bilaterally, Diffuse end-exp wheezing b/l , No rales,+  Rhonchi b/l. No use of accessory muscles of respiration.  CARDIOVASCULAR: S1, S2 RRR. No murmurs, rubs, gallops, clicks.  ABDOMEN: Soft, nontender, nondistended. Bowel sounds present. No organomegaly or mass.  EXTREMITIES: No pedal edema, cyanosis, or clubbing. + 2 pedal & radial pulses b/l.   NEUROLOGIC: Cranial nerves II through XII are intact. No focal Motor or sensory deficits appreciated b/l PSYCHIATRIC: The patient is alert and oriented x 3. Good affect.  SKIN: No obvious rash, lesion, or ulcer.   LABORATORY PANEL:   CBC  Recent Labs Lab 05/05/16 1251  WBC 19.0*  HGB 14.2  HCT 41.2  PLT 317   ------------------------------------------------------------------------------------------------------------------  Chemistries   Recent Labs Lab 05/05/16 1251  NA 136  K 3.5  CL 101  CO2 27  GLUCOSE 99  BUN 22*  CREATININE  0.84  CALCIUM 8.9  AST 25  ALT 18  ALKPHOS 70  BILITOT 1.1   ------------------------------------------------------------------------------------------------------------------  Cardiac Enzymes  Recent Labs Lab 05/05/16 1251  TROPONINI <0.03   ------------------------------------------------------------------------------------------------------------------  RADIOLOGY:  Dg Chest 2 View  Result Date: 05/05/2016 CLINICAL DATA:  Patient reports cough with clear sputum and SOB x2-3 months. Reports symptoms have worsened in past 2 days. Hx COPD, partial R lung removal. Former smoker. EXAM: CHEST  2 VIEW COMPARISON:  Chest x-ray dated 04/09/2015. FINDINGS: Heart size and mediastinal contours are normal. Atherosclerotic changes noted at the aortic arch. Stable postsurgical changes at the right lung apex, with associated volume loss. 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 No pleural effusion or pneumothorax seen. No acute or suspicious osseous finding. IMPRESSION: 1. Hyperexpanded lungs indicating COPD/emphysema. 2. Stable postsurgical changes at the right lung apex. 3. Aortic atherosclerosis. 4. No acute findings.  No evidence of pneumonia or pulmonary edema. Electronically Signed   By: Bary Richard M.D.   On: 05/05/2016 09:15     IMPRESSION AND PLAN:   71 year old male with past medical history of COPD, GERD, diverticulitis who presents to the hospital due to shortness of breath.  1. Acute COPD exacerbation-this is the cause of patient's shortness of breath. -Chest x-ray is negative for any acute pneumonia. Patient has failed treatment as an outpatient with oral steroids. -We'll admit the patient to the hospital, started on IV steroids, scheduled DuoNeb nebs, Pulmicort nebs, antitussives and follow clinically. -Assess for home oxygen prior to discharge.  2. Leukocytosis-I suspect this is steroid mediated. No acute source of infection. -We'll follow white cell  count. Hold off on antibiotics.  3. GERD-placed on Protonix.    All the records are reviewed and case discussed with ED provider. Management plans discussed with the patient, family and they are in agreement.  CODE STATUS: Full code  TOTAL TIME TAKING CARE OF THIS PATIENT: 40 minutes.    Houston Siren M.D on 05/05/2016 at 2:05 PM  Between 7am to 6pm - Pager - [phone]  After 6pm go to [email or web address] - password [email or web address] [facility]  [facility]  Office  [phone]  CC: Primary care physician; Sula Rumple, MD

## 2016-05-05 NOTE — ED Provider Notes (Signed)
[facility] Emergency Department Provider Note  Time seen: 12:37 PM  I have reviewed the triage vital signs and the nursing notes.   HISTORY  Chief Complaint Shortness of Breath    HPI Charles Webster is a 71 y.o. male with a past medical history of COPD, bronchitis, presents to the emergency department for continued cough and shortness of breath. According to the patient for the past 3 weeks he has had increased cough and shortness of breath. Patient was seen 3/31 and started on prednisone taper. Patient is currently taking the prednisone taper continues to feel worse and since last night has had significant shortness of breath and labored breathing. He went back to urgent care today was given breathing treatments and a chest x-ray. Patient continued to have significant wheeze and was transferred to the ER for further treatment. Here the patient is in mild distress, with labored breathing with coarse lung sounds bilaterally. Denies any chest pain. Denies any fever. Currently satting 95% on room air. Patient does not wear oxygen at baseline.  Past Medical History:  Diagnosis Date  . Bronchitis   . COPD (chronic obstructive pulmonary disease) (HCC)   . Diverticulitis     There are no active problems to display for this patient.   Past Surgical History:  Procedure Laterality Date  . LUNG REMOVAL, PARTIAL  2005   removed 20% of right lung    Prior to Admission medications   Medication Sig Start Date End Date Taking? Authorizing Provider  albuterol (PROAIR HFA) 108 (90 Base) MCG/ACT inhaler Inhale into the lungs every 6 (six) hours as needed for wheezing or shortness of breath.    Historical Provider, MD  albuterol (PROVENTIL HFA;VENTOLIN HFA) 108 (90 Base) MCG/ACT inhaler Inhale 2 puffs into the lungs every 4 (four) hours as needed for wheezing or shortness of breath. 05/09/15   Hassan Rowan, MD  albuterol (PROVENTIL) (2.5 MG/3ML) 0.083% nebulizer solution Take 2.5  mg by nebulization every 6 (six) hours as needed for wheezing or shortness of breath.    Historical Provider, MD  cetirizine (ZYRTEC) 10 MG tablet Take 10 mg by mouth daily.    Historical Provider, MD  ciprofloxacin (CIPRO) 250 MG tablet Take 1 tablet (250 mg total) by mouth every 12 (twelve) hours. 04/28/16   Payton Mccallum, MD  Fluticasone-Salmeterol (ADVAIR) 250-50 MCG/DOSE AEPB Inhale 1 puff into the lungs 2 (two) times daily.    Historical Provider, MD  omeprazole (PRILOSEC) 40 MG capsule Take 40 mg by mouth daily.    Historical Provider, MD  predniSONE (DELTASONE) 20 MG tablet 3 tabs po qd for 2 days, then 2 tabs po qd for 3 days, then 1 tab po qd for 3 days, then half a tab po qd for 2 days 04/28/16   Payton Mccallum, MD  Spacer/Aero-Holding Chambers (AEROCHAMBER PLUS) inhaler Use as instructed 05/05/16   Domenick Gong, MD    Allergies  Allergen Reactions  . Alpha Blocker Quinazolines   . Clindamycin/Lincomycin Nausea Only  . Levaquin [Levofloxacin]   . Penicillins     No family history on file.  Social History Social History  Substance Use Topics  . Smoking status: Former Smoker    Quit date: 10/04/2003  . Smokeless tobacco: Never Used  . Alcohol use No    Review of Systems Constitutional: Negative for fever. Cardiovascular: Negative for chest pain. Respiratory: Positive for shortness of breath and cough. Gastrointestinal: Negative for abdominal pain Neurological: Negative for headache 10-point ROS otherwise  negative.  ____________________________________________   PHYSICAL EXAM:  VITAL SIGNS: ED Triage Vitals [05/05/16 1205]  Enc Vitals Group     BP (!) 132/94     Pulse Rate (!) 103     Resp (!) 23     Temp 98 F (36.7 C)     Temp Source Oral     SpO2 94 %     Weight      Height      Head Circumference      Peak Flow      Pain Score      Pain Loc      Pain Edu?      Excl. in GC?     Constitutional: Alert and oriented. Labored breathing, mild respiratory  distress. Eyes: Normal exam ENT   Head: Normocephalic and atraumatic   Mouth/Throat: Mucous membranes are moist. Cardiovascular: Normal rate, regular rhythm around 100 bpm. Respiratory: Mild tachypnea with moderate diffuse expiratory wheeze and rhonchi in all lung fields. Gastrointestinal: Soft and nontender. No distention.   Musculoskeletal: Nontender with normal range of motion in all extremities. No lower extremity tenderness Neurologic:  Normal speech and language. No gross focal neurologic deficits Skin:  Skin is warm, dry and intact.  Psychiatric: Mood and affect are normal.   ____________________________________________    EKG  EKG reviewed and interpreted by myself shows normal sinus rhythm at 91 bpm, narrow QRS, normal axis, normal intervals, no ST changes. Normal EKG.  ____________________________________________    RADIOLOGY  Chest x-ray taken by urgent care shows changes consistent with COPD/bronchitic changes. No consolidation.  ____________________________________________   INITIAL IMPRESSION / ASSESSMENT AND PLAN / ED COURSE  Pertinent labs & imaging results that were available during my care of the patient were reviewed by me and considered in my medical decision making (see chart for details).  Patient presents to the emergency department with shortness of breath. Patient has diffuse rhonchi and expiratory wheeze on exam. We will treat with DuoNeb's, Solu-Medrol, check labs and closely monitor. Currently the patient is satting 95% on room air but is labored. We will repeat and ambulating saturation.  Patient desats to 92% with significant dyspnea with ambulation. He continues to have coarse breath sounds bilaterally. Patient has failed outpatient therapy and continues to worsen. We'll admit to the hospital for further treatment.  ____________________________________________   FINAL CLINICAL IMPRESSION(S) / ED DIAGNOSES  Dyspnea COPD exacerbation     Minna Antis, MD 05/05/16 1337

## 2016-05-05 NOTE — ED Triage Notes (Signed)
Per patient was seen a week ago . Patient stated has a cough / chest congestion causing him to have SOB. Patient stated medication not helping.

## 2016-05-06 DIAGNOSIS — N419 Inflammatory disease of prostate, unspecified: Secondary | ICD-10-CM | POA: Diagnosis not present

## 2016-05-06 DIAGNOSIS — T380X5A Adverse effect of glucocorticoids and synthetic analogues, initial encounter: Secondary | ICD-10-CM | POA: Diagnosis not present

## 2016-05-06 DIAGNOSIS — K219 Gastro-esophageal reflux disease without esophagitis: Secondary | ICD-10-CM | POA: Diagnosis not present

## 2016-05-06 DIAGNOSIS — B370 Candidal stomatitis: Secondary | ICD-10-CM | POA: Diagnosis not present

## 2016-05-06 DIAGNOSIS — D72829 Elevated white blood cell count, unspecified: Secondary | ICD-10-CM | POA: Diagnosis not present

## 2016-05-06 DIAGNOSIS — J441 Chronic obstructive pulmonary disease with (acute) exacerbation: Secondary | ICD-10-CM | POA: Diagnosis not present

## 2016-05-06 MED ORDER — NYSTATIN 100000 UNIT/ML MT SUSP: 5.0000 mL | Freq: Four times a day (QID) | OROMUCOSAL | 0 refills | Status: DC

## 2016-05-06 MED ORDER — ALBUTEROL SULFATE (2.5 MG/3ML) 0.083% IN NEBU: 2.5000 mg | INHALATION_SOLUTION | Freq: Four times a day (QID) | RESPIRATORY_TRACT | 0 refills | Status: DC | PRN

## 2016-05-06 MED ORDER — TIOTROPIUM BROMIDE MONOHYDRATE 18 MCG IN CAPS: 18.0000 ug | ORAL_CAPSULE | Freq: Every day | RESPIRATORY_TRACT | 0 refills | Status: DC

## 2016-05-06 MED ORDER — TIOTROPIUM BROMIDE MONOHYDRATE 18 MCG IN CAPS
18.0000 ug | ORAL_CAPSULE | Freq: Every day | RESPIRATORY_TRACT | Status: DC
  Filled 2016-05-06: qty 5

## 2016-05-06 MED ORDER — NYSTATIN 100000 UNIT/ML MT SUSP: 5.0000 mL | Freq: Four times a day (QID) | OROMUCOSAL | Status: DC

## 2016-05-06 MED ORDER — PREDNISONE 5 MG PO TABS: ORAL_TABLET | ORAL | 0 refills | Status: DC

## 2016-05-06 NOTE — Progress Notes (Signed)
Patient discharging home. Instructions and prescriptions given to patient, verbalized understanding. Family providing transportation.

## 2016-05-06 NOTE — Discharge Summary (Signed)
[facility] [place] [facility]   PATIENT NAME: Charles Webster    MR#:  [id number]  DATE OF BIRTH:  1945/12/10  DATE OF ADMISSION:  05/05/2016 ADMITTING PHYSICIAN: Houston Siren, MD  DATE OF DISCHARGE: 05/06/2016  PRIMARY CARE PHYSICIAN: Sula Rumple, MD    ADMISSION DIAGNOSIS:  COPD exacerbation (HCC) [J44.1]  DISCHARGE DIAGNOSIS:  Active Problems:   COPD exacerbation (HCC)   SECONDARY DIAGNOSIS:   Past Medical History:  Diagnosis Date  . Bronchitis   . COPD (chronic obstructive pulmonary disease) (HCC)   . Diverticulitis     HOSPITAL COURSE:   1.  COPD exacerbation. Failed outpatient treatment with oral steroids. Patient also mowed the lawn and was out with all the pollen and he does have allergies. Patient was started on IV steroids. He was started on DuoNeb and Pulmicort nebulizers. Patient did not qualify for home oxygen. He is on Cipro for prostate infection which would also cover the lung. I prescribed a longer course of oral steroids since he failed outpatient treatment. I prescribed Spiriva and albuterol nebulizer. The patient already has his Advair at home 2. Leukocytosis secondary to steroids 3. GERD on omeprazole 4. 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 Thrush nystatin swish and swallow prescribed  DISCHARGE CONDITIONS:   Satisfactory  CONSULTS OBTAINED:   none  DRUG ALLERGIES:   Allergies  Allergen Reactions  . Alpha Blocker Quinazolines   . Clindamycin/Lincomycin Nausea Only  . Levaquin [Levofloxacin]   . Penicillins     DISCHARGE MEDICATIONS:   Current Discharge Medication List    START taking these medications   Details  nystatin (MYCOSTATIN) 100000 UNIT/ML suspension Take 5 mLs (500,000 Units total) by mouth 4 (four) times daily. Qty: 60 mL, Refills: 0    tiotropium (SPIRIVA) 18 MCG inhalation capsule Place 1 capsule (18 mcg total) into inhaler and inhale daily. Qty: 30 capsule, Refills: 0      CONTINUE these  medications which have CHANGED   Details  albuterol (PROVENTIL) (2.5 MG/3ML) 0.083% nebulizer solution Take 3 mLs (2.5 mg total) by nebulization every 6 (six) hours as needed for wheezing or shortness of breath. Qty: 100 vial, Refills: 0    predniSONE (DELTASONE) 5 MG tablet 8 tabs po day1; 6 tabs po day2; 4 tabs po day3; 3 tabs po day4; 2 tabs po day5; 1 tab po day6; 1/2 tab po day7,8 Qty: 25 tablet, Refills: 0      CONTINUE these medications which have NOT CHANGED   Details  albuterol (PROVENTIL HFA;VENTOLIN HFA) 108 (90 Base) MCG/ACT inhaler Inhale 2 puffs into the lungs every 4 (four) hours as needed for wheezing or shortness of breath. Qty: 1 Inhaler, Refills: 0    cetirizine (ZYRTEC) 10 MG tablet Take 10 mg by mouth daily.    ciprofloxacin (CIPRO) 250 MG tablet Take 1 tablet (250 mg total) by mouth every 12 (twelve) hours. Qty: 42 tablet, Refills: 0    Fluticasone-Salmeterol (ADVAIR) 250-50 MCG/DOSE AEPB Inhale 1 puff into the lungs 2 (two) times daily.    omeprazole (PRILOSEC) 40 MG capsule Take 40 mg by mouth daily.    Spacer/Aero-Holding Chambers (AEROCHAMBER PLUS) inhaler Use as instructed Qty: 1 each, Refills: 2         DISCHARGE INSTRUCTIONS:   He must call Dr. Elmer Ramp office for follow-up appointment on Monday and try to be seen within a week  If you experience worsening of your admission symptoms, develop shortness of breath, life threatening emergency, suicidal or homicidal  thoughts you must seek medical attention immediately by calling 911 or calling your MD immediately  if symptoms less severe.  You Must read complete instructions/literature along with all the possible adverse reactions/side effects for all the Medicines you take and that have been prescribed to you. Take any new Medicines after you have completely understood and accept all the possible adverse reactions/side effects.   Please note  You were cared for by a hospitalist during your hospital stay.  If you have any questions about your discharge medications or the care you received while you were in the hospital after you are discharged, you can call the unit and asked to speak with the hospitalist on call if the hospitalist that took care of you is not available. Once you are discharged, your primary care physician will handle any further medical issues. Please note that NO REFILLS for any discharge medications will be authorized once you are discharged, as it is imperative that you return to your primary care physician (or establish a relationship with a primary care physician if you do not have one) for your aftercare needs so that they can reassess your need for medications and monitor your lab values.    Today   CHIEF COMPLAINT:   Chief Complaint  Patient presents with  . Shortness of Breath    HISTORY OF PRESENT ILLNESS:  Charles Webster  is a 71 y.o. male with a known history of COPD and presents with shortness of breath.   VITAL SIGNS:  Blood pressure 122/79, pulse 79, temperature 97.8 F (36.6 C), temperature source Oral, resp. rate 17, height  (1.676 m), weight 58.9 kg (129 lb 13.6 oz), SpO2 98 %.  I/O:   Intake/Output Summary (Last 24 hours) at 05/06/16 1421 Last data filed at 05/06/16 0945  Gross per 24 hour  Intake              480 ml  Output                0 ml  Net              480 ml    PHYSICAL EXAMINATION:  GENERAL:  71 y.o.-year-old patient lying in the bed with no acute distress.  EYES: Pupils equal, round, reactive to light and accommodation. No scleral icterus. Extraocular muscles intact.  HEENT: Head atraumatic, normocephalic. Oropharynx and nasopharynx clear.  NECK:  Supple, no jugular venous distention. No thyroid enlargement, no tenderness.  LUNGS: Decreased breath sounds bilaterally, no wheezing, rales,rhonchi or crepitation. No use of accessory muscles of respiration.  CARDIOVASCULAR: S1, S2 normal. No murmurs, rubs, or gallops.  ABDOMEN: Soft,  non-tender, non-distended. Bowel sounds present. No organomegaly or mass.  EXTREMITIES: No pedal edema, cyanosis, or clubbing.  NEUROLOGIC: Cranial nerves II through XII are intact. Muscle strength 5/5 in all extremities. Sensation intact. Gait not checked.  PSYCHIATRIC: The patient is alert and oriented x 3.  SKIN: No obvious rash, lesion, or ulcer.   DATA REVIEW:   CBC  Recent Labs Lab 05/05/16 1251  WBC 19.0*  HGB 14.2  HCT 41.2  PLT 317    Chemistries   Recent Labs Lab 05/05/16 1251  NA 136  K 3.5  CL 101  CO2 27  GLUCOSE 99  BUN 22*  CREATININE 0.84  CALCIUM 8.9  AST 25  ALT 18  ALKPHOS 70  BILITOT 1.1    Cardiac Enzymes  Recent Labs Lab 05/05/16 1251  TROPONINI <0.03  RADIOLOGY:  Dg Chest 2 View  Result Date: 05/05/2016 CLINICAL DATA:  Patient reports cough with clear sputum and SOB x2-3 months. Reports symptoms have worsened in past 2 days. Hx COPD, partial R lung removal. Former smoker. EXAM: CHEST  2 VIEW COMPARISON:  Chest x-ray dated 04/09/2015. FINDINGS: Heart size and mediastinal contours are normal. Atherosclerotic changes noted at the aortic arch. Stable postsurgical changes at the right lung apex, with associated volume loss. 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 No pleural effusion or pneumothorax seen. No acute or suspicious osseous finding. IMPRESSION: 1. Hyperexpanded lungs indicating COPD/emphysema. 2. Stable postsurgical changes at the right lung apex. 3. Aortic atherosclerosis. 4. No acute findings.  No evidence of pneumonia or pulmonary edema. Electronically Signed   By: Bary Zyann Mabry M.D.   On: 05/05/2016 09:15    Management plans discussed with the patient, family and they are in agreement.  CODE STATUS:     Code Status Orders        Start     Ordered   05/05/16 1626  Full code  Continuous     05/05/16 1625    Code Status History    Date Active Date Inactive Code Status Order ID Comments User  Context   This patient has a current code status but no historical code status.      TOTAL TIME TAKING CARE OF THIS PATIENT: 35 minutes.    Alford Highland M.D on 05/06/2016 at 2:21 PM  Between 7am to 6pm - Pager - [phone]  After 6pm go to [email or web address] - password [email or web address]  [facility] Office  [phone]  CC: Primary care physician; Sula Rumple, MD

## 2016-05-06 NOTE — Discharge Instructions (Addendum)
After using advair, rinse mouth out with water.   Chronic Obstructive Pulmonary Disease Exacerbation Chronic obstructive pulmonary disease (COPD) is a common lung condition in which airflow from the lungs is limited. COPD is a general term that can be used to describe many different lung problems that limit airflow, including chronic bronchitis and emphysema. COPD exacerbations are episodes when breathing symptoms become much worse and require extra treatment. Without treatment, COPD exacerbations can be life threatening, and frequent COPD exacerbations can cause further damage to your lungs. What are the causes?  Respiratory infections.  Exposure to smoke.  Exposure to air pollution, chemical fumes, or dust. Sometimes there is no apparent cause or trigger. What increases the risk?  Smoking cigarettes.  Older age.  Frequent prior COPD exacerbations. What are the signs or symptoms?  Increased coughing.  Increased thick spit (sputum) production.  Increased wheezing.  Increased shortness of breath.  Rapid breathing.  Chest tightness. How is this diagnosed? Your medical history, a physical exam, and tests will help your health care provider make a diagnosis. Tests may include:  A chest X-ray.  Basic lab tests.  Sputum testing.  An arterial blood gas test. How is this treated? Depending on the severity of your COPD exacerbation, you may need to be admitted to a hospital for treatment. Some of the treatments commonly used to treat COPD exacerbations are:  Antibiotic medicines.  Bronchodilators. These are drugs that expand the air passages. They may be given with an inhaler or nebulizer. Spacer devices may be needed to help improve drug delivery.  Corticosteroid medicines.  Supplemental oxygen therapy.  Airway clearing techniques, such as noninvasive ventilation (NIV) and positive expiratory pressure (PEP). These provide respiratory support through a mask or other  noninvasive device. Follow these instructions at home:  Do not smoke. Quitting smoking is very important to prevent COPD from getting worse and exacerbations from happening as often.  Avoid exposure to all substances that irritate the airway, especially to tobacco smoke.  If you were prescribed an antibiotic medicine, finish it all even if you start to feel better.  Take all medicines as directed by your health care provider.It is important to use correct technique with inhaled medicines.  Drink enough fluids to keep your urine clear or pale yellow (unless you have a medical condition that requires fluid restriction).  Use a cool mist vaporizer. This makes it easier to clear your chest when you cough.  If you have a home nebulizer and oxygen, continue to use them as directed.  Maintain all necessary vaccinations to prevent infections.  Exercise regularly.  Eat a healthy diet.  Keep all follow-up appointments as directed by your health care provider. Get help right away if:  You have worsening shortness of breath.  You have trouble talking.  You have severe chest pain.  You have blood in your sputum.  You have a fever.  You have weakness, vomit repeatedly, or faint.  You feel confused.  You continue to get worse. This information is not intended to replace advice given to you by your health care provider. Make sure you discuss any questions you have with your health care provider. Document Released: 11/12/2006 Document Revised: 06/23/2015 Document Reviewed: 09/19/2012 Elsevier Interactive Patient Education  2017 [employer].

## 2016-07-08 ENCOUNTER — Encounter: Payer: Self-pay | Admitting: Gynecology

## 2016-07-08 ENCOUNTER — Ambulatory Visit
Admission: EM | Admit: 2016-07-08 | Discharge: 2016-07-08 | Disposition: A | Discharge status: Home / Self Care | Payer: Medicare Other | Attending: Internal Medicine | Admitting: Internal Medicine

## 2016-07-08 DIAGNOSIS — J449 Chronic obstructive pulmonary disease, unspecified: Secondary | ICD-10-CM

## 2016-07-08 DIAGNOSIS — N411 Chronic prostatitis: Secondary | ICD-10-CM | POA: Diagnosis not present

## 2016-07-08 DIAGNOSIS — Z760 Encounter for issue of repeat prescription: Secondary | ICD-10-CM

## 2016-07-08 MED ORDER — FLUTICASONE-SALMETEROL 250-50 MCG/DOSE IN AEPB: 1.0000 | INHALATION_SPRAY | Freq: Two times a day (BID) | RESPIRATORY_TRACT | 1 refills | Status: DC

## 2016-07-08 MED ORDER — ALBUTEROL SULFATE HFA 108 (90 BASE) MCG/ACT IN AERS: 2.0000 | INHALATION_SPRAY | RESPIRATORY_TRACT | 1 refills | Status: DC | PRN

## 2016-07-08 MED ORDER — CIPROFLOXACIN HCL 250 MG PO TABS: 250.0000 mg | ORAL_TABLET | Freq: Two times a day (BID) | ORAL | 0 refills | Status: DC

## 2016-07-08 NOTE — Discharge Instructions (Signed)
Continue Advair 250/50 twice a day as directed. Continue Albuterol 2 puffs every 4 to 6 hours as needed. Continue Spiriva as directed. Continue Cipro 250mg  twice a day as directed. Call [facility] in [place] to schedule appointment with a PCP as soon as possible for continued management of chronic health conditions.

## 2016-07-08 NOTE — ED Provider Notes (Signed)
CSN: [id number]     Arrival date & time 07/08/16  [id number] History   First MD Initiated Contact with Patient 07/08/16 1025     Chief Complaint  Patient presents with  . Medication Refill   (Consider location/radiation/quality/duration/timing/severity/associated sxs/prior Treatment) 71 year old male presents for refill on his COPD and chronic prostatitis medications. He has a long-standing history of COPD that has become more severe in the past few months due to pollen and weather. He usually takes Advair 250/50 twice a day as well as Spiriva daily. He also uses his Albuterol inhaler every 6 hours. His PCP retired and he tried to get into see another provider at [facility] in [place] but they are not able to see him for over 2 to 3 weeks and he will run out of medication tomorrow. He still Albuterol nebulizer solution and Spiriva at home but needs Albuterol inhaler and Advair. 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 Requests refill on Cipro until he can see his urologist again.     The history is provided by the patient.  Medication Refill  Reason for request:  Clinic/provider not available and medications ran out Medications taken before: yes - see home medications     Past Medical History:  Diagnosis Date  . Bronchitis   . COPD (chronic obstructive pulmonary disease) (HCC)   . Diverticulitis    Past Surgical History:  Procedure Laterality Date  . LUNG REMOVAL, PARTIAL  2005   removed 20% of right lung   Family History  Problem Relation Age of Onset  . Heart disease Mother   . Kidney failure Father    Social History  Substance Use Topics  . Smoking status: Former Smoker    Packs/day: 1.00    Years: 40.00    Quit date: 10/04/2003  . Smokeless tobacco: Never Used  . Alcohol use No    Review of Systems  Constitutional: Negative for activity change, appetite change, chills,  fatigue and fever.  HENT: Negative for congestion, ear discharge, ear pain, postnasal drip, rhinorrhea, sinus pain, sinus pressure, sore throat and trouble swallowing.   Respiratory: Positive for cough (chronic), chest tightness, shortness of breath and wheezing (chronic).   Cardiovascular: Negative for chest pain.  Gastrointestinal: Negative for abdominal pain, diarrhea, nausea and vomiting.  Genitourinary: Positive for difficulty urinating (when not on Cipro). Negative for discharge, dysuria, hematuria and urgency.  Musculoskeletal: Negative for neck pain and neck stiffness.  Skin: Negative for rash and wound.  Neurological: Negative for dizziness, syncope, weakness, light-headedness, numbness and headaches.  Hematological: Negative for adenopathy.    Allergies  Alpha blocker quinazolines; Clindamycin/lincomycin; Levaquin [levofloxacin]; and Penicillins  Home Medications   Prior to Admission medications   Medication Sig Start Date End Date Taking? Authorizing Provider  albuterol (PROVENTIL) (2.5 MG/3ML) 0.083% nebulizer solution Take 3 mLs (2.5 mg total) by nebulization every 6 (six) hours as needed for wheezing or shortness of breath. 05/06/16  Yes Wieting, Richard, MD  cetirizine (ZYRTEC) 10 MG tablet Take 10 mg by mouth daily.   Yes [provider]  omeprazole (PRILOSEC) 40 MG capsule Take 40 mg by mouth daily.   Yes [provider]  Spacer/Aero-Holding Chambers (AEROCHAMBER PLUS) inhaler Use as instructed 05/05/16  Yes Domenick GongMortenson, Ashley, MD  tiotropium (SPIRIVA) 18 MCG inhalation capsule Place 1 capsule (18 mcg total) into inhaler and inhale daily. 05/06/16  Yes Alford HighlandWieting, Richard, MD  albuterol (  PROVENTIL HFA;VENTOLIN HFA) 108 (90 Base) MCG/ACT inhaler Inhale 2 puffs into the lungs every 4 (four) hours as needed for wheezing or shortness of breath. 07/08/16   Sudie Grumbling, NP  ciprofloxacin (CIPRO) 250 MG tablet Take 1 tablet (250 mg total) by mouth every 12 (twelve)  hours. 07/08/16   Sudie Grumbling, NP  Fluticasone-Salmeterol (ADVAIR DISKUS) 250-50 MCG/DOSE AEPB Inhale 1 puff into the lungs 2 (two) times daily. 07/08/16   Sudie Grumbling, NP   Meds Ordered and Administered this Visit  Medications - No data to display  BP 133/82 (BP Location: Left Arm)   Pulse 92   Temp 97.6 F (36.4 C) (Oral)   Resp 16   Wt 133 lb (60.3 kg)   SpO2 97%   BMI 21.47 kg/m  No data found.   Physical Exam  Constitutional: He is oriented to person, place, and time. He appears well-developed and well-nourished. No distress.  HENT:  Head: Normocephalic and atraumatic.  Right Ear: External ear normal.  Left Ear: External ear normal.  Nose: Nose normal.  Mouth/Throat: Oropharynx is clear and moist.  Eyes: Conjunctivae and EOM are normal.  Neck: Normal range of motion.  Cardiovascular: Normal rate, regular rhythm and normal heart sounds.   Pulmonary/Chest: Effort normal. No tachypnea. No respiratory distress. He has decreased breath sounds in the right upper field, the right lower field, the left upper field and the left lower field. He has wheezes in the right upper field and the left upper field. He has no rhonchi. He has no rales.  Musculoskeletal: Normal range of motion.  Neurological: He is alert and oriented to person, place, and time.  Skin: Skin is warm and dry. Capillary refill takes less than 2 seconds.  Psychiatric: He has a normal mood and affect. His behavior is normal. Judgment and thought content normal.    Urgent Care Course     Procedures (including critical care time)  Labs Review Labs Reviewed - No data to display  Imaging Review No results found.   Visual Acuity Review  Right Eye Distance:   Left Eye Distance:   Bilateral Distance:    Right Eye Near:   Left Eye Near:    Bilateral Near:         MDM   1. Chronic obstructive pulmonary disease, unspecified COPD type (HCC)   2. Chronic prostatitis   3. Medication refill     Discussed that I can provide a refill on his medication that he needs for 1 month. He will need to see a PCP or his Specialist for additional refills. Continue Advair 250/50 twice a day as directed. Continue Albuterol 2 puffs every 6 hours as needed. Continue Spiriva as directed. Continue Cipro 250mg  twice a day as directed by his Urologist. Call [facility] in [place] (did not want to see another PCP at [facility] or [facility] in [place]) to schedule follow-up appointment for continued management of chronic health conditions.     Sudie Grumbling, NP 07/09/16 1435

## 2016-07-08 NOTE — ED Triage Notes (Signed)
Per patient here to refill his RX. PRO-AIR and Advair.

## 2016-07-11 DIAGNOSIS — J449 Chronic obstructive pulmonary disease, unspecified: Secondary | ICD-10-CM | POA: Diagnosis not present

## 2016-07-11 DIAGNOSIS — N411 Chronic prostatitis: Secondary | ICD-10-CM | POA: Diagnosis not present

## 2016-07-11 DIAGNOSIS — K219 Gastro-esophageal reflux disease without esophagitis: Secondary | ICD-10-CM | POA: Diagnosis not present

## 2016-11-05 DIAGNOSIS — Z23 Encounter for immunization: Secondary | ICD-10-CM | POA: Diagnosis not present

## 2017-01-09 DIAGNOSIS — N411 Chronic prostatitis: Secondary | ICD-10-CM | POA: Diagnosis not present

## 2017-01-09 DIAGNOSIS — K219 Gastro-esophageal reflux disease without esophagitis: Secondary | ICD-10-CM | POA: Diagnosis not present

## 2017-01-09 DIAGNOSIS — J449 Chronic obstructive pulmonary disease, unspecified: Secondary | ICD-10-CM | POA: Diagnosis not present

## 2017-05-01 ENCOUNTER — Ambulatory Visit
Admission: EM | Admit: 2017-05-01 | Discharge: 2017-05-01 | Disposition: A | Discharge status: Home / Self Care | Payer: Medicare Other | Attending: Emergency Medicine | Admitting: Emergency Medicine

## 2017-05-01 ENCOUNTER — Other Ambulatory Visit: Payer: Self-pay

## 2017-05-01 ENCOUNTER — Encounter: Payer: Self-pay | Admitting: Emergency Medicine

## 2017-05-01 DIAGNOSIS — R05 Cough: Secondary | ICD-10-CM

## 2017-05-01 DIAGNOSIS — J441 Chronic obstructive pulmonary disease with (acute) exacerbation: Secondary | ICD-10-CM

## 2017-05-01 DIAGNOSIS — J309 Allergic rhinitis, unspecified: Secondary | ICD-10-CM

## 2017-05-01 MED ORDER — IPRATROPIUM-ALBUTEROL 0.5-2.5 (3) MG/3ML IN SOLN
3.0000 mL | Freq: Once | RESPIRATORY_TRACT | Status: AC
  Administered 2017-05-01: 3 mL via RESPIRATORY_TRACT

## 2017-05-01 MED ORDER — PREDNISONE 10 MG PO TABS: ORAL_TABLET | ORAL | 0 refills | Status: DC

## 2017-05-01 MED ORDER — ALBUTEROL SULFATE HFA 108 (90 BASE) MCG/ACT IN AERS: 2.0000 | INHALATION_SPRAY | RESPIRATORY_TRACT | 0 refills | Status: DC | PRN

## 2017-05-01 MED ORDER — ALBUTEROL SULFATE (2.5 MG/3ML) 0.083% IN NEBU: 2.5000 mg | INHALATION_SOLUTION | Freq: Four times a day (QID) | RESPIRATORY_TRACT | 0 refills | Status: DC | PRN

## 2017-05-01 MED ORDER — DOXYCYCLINE HYCLATE 100 MG PO CAPS: 100.0000 mg | ORAL_CAPSULE | Freq: Two times a day (BID) | ORAL | 0 refills | Status: DC

## 2017-05-01 NOTE — Discharge Instructions (Addendum)
Take medication as prescribed. Rest. Drink plenty of fluids.  ° °Follow up with your primary care physician this week as needed. Return to Urgent care for new or worsening concerns.  ° °

## 2017-05-01 NOTE — ED Triage Notes (Signed)
Patient reports history of COPD.  Patient reports cough and congestion for the past 3 weeks.

## 2017-05-01 NOTE — ED Provider Notes (Signed)
[facility] ____________________________________________  Time seen: Approximately 8:30 AM  I have reviewed the triage vital signs and the nursing notes.   HISTORY  Chief Complaint Cough   HPI Charles Webster is a 72 y.o. male past medical history of chronic bronchitis, COPD, prostatitis and diverticulitis presenting for evaluation of 1 week of cough and congestion symptoms.  Patient states that he has a history of seasonal allergies, and feels that the symptoms started from allergies.  Has continue taking Zyrtec and Flonase.  States some clear nasal drainage with gradual increasing cough with onset gradually of wheezing.  States some shortness of breath with associated wheezing, and stating consistent with previous COPD exacerbations.  Denies any atypical shortness of breath.  Denies associated chest pain.  States he is sore from coughing.  States over the last 4 days he has been using home albuterol nebulizer intermittently with some improvement.  States that he is almost out of his albuterol solutions as well as his inhaler.  Denies accompanying fevers, hemoptysis, body aches.  Denies known sick contacts.  Reports continues to eat and drink well.  Denies other aggravating or alleviating factors. Denies recent sickness. Denies recent antibiotic use.   Marina GoodellFeldpausch, Dale E, MD: PCP   Past Medical History:  Diagnosis Date  . Bronchitis   . COPD (chronic obstructive pulmonary disease) (HCC)   . Diverticulitis     Patient Active Problem List   Diagnosis Date Noted  . COPD exacerbation (HCC) 05/05/2016    Past Surgical History:  Procedure Laterality Date  . LUNG REMOVAL, PARTIAL  2005   removed 20% of right lung     No current facility-administered medications for this encounter.   Current Outpatient Medications:  .  albuterol (PROVENTIL HFA;VENTOLIN HFA) 108 (90 Base) MCG/ACT inhaler, Inhale 2 puffs into the lungs every 4 (four) hours as needed for wheezing or  shortness of breath., Disp: 1 Inhaler, Rfl: 1 .  albuterol (PROVENTIL) (2.5 MG/3ML) 0.083% nebulizer solution, Take 3 mLs (2.5 mg total) by nebulization every 6 (six) hours as needed for wheezing or shortness of breath., Disp: 100 vial, Rfl: 0 .  Fluticasone-Salmeterol (ADVAIR DISKUS) 250-50 MCG/DOSE AEPB, Inhale 1 puff into the lungs 2 (two) times daily., Disp: 60 each, Rfl: 1 .  omeprazole (PRILOSEC) 40 MG capsule, Take 40 mg by mouth daily., Disp: , Rfl:  .  tiotropium (SPIRIVA) 18 MCG inhalation capsule, Place 1 capsule (18 mcg total) into inhaler and inhale daily., Disp: 30 capsule, Rfl: 0 .  albuterol (PROVENTIL HFA;VENTOLIN HFA) 108 (90 Base) MCG/ACT inhaler, Inhale 2 puffs into the lungs every 4 (four) hours as needed for wheezing., Disp: 1 Inhaler, Rfl: 0 .  albuterol (PROVENTIL) (2.5 MG/3ML) 0.083% nebulizer solution, Take 3 mLs (2.5 mg total) by nebulization every 6 (six) hours as needed for wheezing or shortness of breath., Disp: 75 mL, Rfl: 0 .  cetirizine (ZYRTEC) 10 MG tablet, Take 10 mg by mouth daily., Disp: , Rfl:  .  doxycycline (VIBRAMYCIN) 100 MG capsule, Take 1 capsule (100 mg total) by mouth 2 (two) times daily., Disp: 20 capsule, Rfl: 0 .  predniSONE (DELTASONE) 10 MG tablet, Start 60 mg po day one, then 50 mg po day two, taper by 10 mg daily until complete., Disp: 21 tablet, Rfl: 0 .  Spacer/Aero-Holding Chambers (AEROCHAMBER PLUS) inhaler, Use as instructed, Disp: 1 each, Rfl: 2  Allergies Alpha blocker quinazolines; Clindamycin/lincomycin; Levaquin [levofloxacin]; and Penicillins  Family History  Problem Relation Age of Onset  .  Heart disease Mother   . Kidney failure Father     Social History Social History   Tobacco Use  . Smoking status: Former Smoker    Packs/day: 1.00    Years: 40.00    Pack years: 40.00    Last attempt to quit: 10/04/2003    Years since quitting: 13.5  . Smokeless tobacco: Never Used  Substance Use Topics  . Alcohol use: No  . Drug  use: No    Review of Systems Constitutional: No fever/chills ENT: No sore throat. Cardiovascular: Denies chest pain. Respiratory: As above.  Gastrointestinal: No abdominal pain Musculoskeletal: Negative for back pain. Skin: Negative for rash.   ____________________________________________   PHYSICAL EXAM:  VITAL SIGNS: ED Triage Vitals  Enc Vitals Group     BP 05/01/17 0817 93/75     Pulse Rate 05/01/17 0817 70     Resp 05/01/17 0817 16     Temp 05/01/17 0817 97.6 F (36.4 C)     Temp Source 05/01/17 0817 Oral     SpO2 05/01/17 0817 98 %     Weight 05/01/17 0814 135 lb (61.2 kg)     Height 05/01/17 0814 5\' 6"  (1.676 m)     Head Circumference --      Peak Flow --      Pain Score 05/01/17 0814 0     Pain Loc --      Pain Edu? --      Excl. in GC? --    Vitals:   05/01/17 0814 05/01/17 0817 05/01/17 0852  BP:  93/75 110/82  Pulse:  70   Resp:  16   Temp:  97.6 F (36.4 C)   TempSrc:  Oral   SpO2:  98%   Weight: 135 lb (61.2 kg)    Height: 5\' 6"  (1.676 m)       Constitutional: Alert and oriented. Well appearing and in no acute distress. Eyes: Conjunctivae are normal.  Head: Atraumatic. No sinus tenderness to palpation. No swelling. No erythema.  Ears: no erythema, normal TMs bilaterally.   Nose:Nasal congestion with clear rhinorrhea  Mouth/Throat: Mucous membranes are moist. No pharyngeal erythema. No tonsillar swelling or exudate.  Neck: No stridor.  No cervical spine tenderness to palpation. Hematological/Lymphatic/Immunilogical: No cervical lymphadenopathy. Cardiovascular: Normal rate, regular rhythm. Grossly normal heart sounds.  Good peripheral circulation. Respiratory: Normal respiratory effort.  No retractions.  Mild scattered rhonchi.  Mild scattered inspiratory and expiratory wheezes.  No focal area of consolidation auscultated.  Speaks in complete sentences.  Dry intermittent cough noted in room.  Good air movement.  Gastrointestinal: Soft and  nontender.  Musculoskeletal: Ambulatory with steady gait.  Neurologic:  Normal speech and language. No gait instability. Skin:  Skin appears warm, dry and intact. No rash noted. Psychiatric: Mood and affect are normal. Speech and behavior are normal.  ___________________________________________   LABS (all labs ordered are listed, but only abnormal results are displayed)  Labs Reviewed - No data to display ____________________________________________  PROCEDURES Procedures   INITIAL IMPRESSION / ASSESSMENT AND PLAN / ED COURSE  Pertinent labs & imaging results that were available during my care of the patient were reviewed by me and considered in my medical decision making (see chart for details).  Well-appearing patient.  No acute distress.  Suspect COPD exacerbation secondary to seasonal allergies.  Continue home allergy treatment.  Will refill albuterol solution and albuterol inhaler.We will start patient on prednisone taper, oral doxycycline and encourage supportive care.  One DuoNeb  given in urgent care.  Discussed strict follow-up and return parameters.  Patient denies need for cough medicine.Discussed indication, risks and benefits of medications with patient.  Discussed follow up with Primary care physician this week. Discussed follow up and return parameters including no resolution or any worsening concerns. Patient verbalized understanding and agreed to plan.   ____________________________________________   FINAL CLINICAL IMPRESSION(S) / ED DIAGNOSES  Final diagnoses:  COPD exacerbation (HCC)  Allergic rhinitis, unspecified seasonality, unspecified trigger     ED Discharge Orders        Ordered    predniSONE (DELTASONE) 10 MG tablet     05/01/17 0848    albuterol (PROVENTIL HFA;VENTOLIN HFA) 108 (90 Base) MCG/ACT inhaler  Every 4 hours PRN     05/01/17 0848    albuterol (PROVENTIL) (2.5 MG/3ML) 0.083% nebulizer solution  Every 6 hours PRN     05/01/17 0848     doxycycline (VIBRAMYCIN) 100 MG capsule  2 times daily     05/01/17 0848       Note: This dictation was prepared with Dragon dictation along with smaller phrase technology. Any transcriptional errors that result from this process are unintentional.         Renford Dills, NP 05/01/17 [phone]

## 2017-05-21 ENCOUNTER — Other Ambulatory Visit: Payer: Self-pay

## 2017-05-21 ENCOUNTER — Ambulatory Visit
Admission: EM | Admit: 2017-05-21 | Discharge: 2017-05-21 | Disposition: A | Discharge status: Home / Self Care | Payer: Medicare Other | Attending: Family Medicine | Admitting: Family Medicine

## 2017-05-21 DIAGNOSIS — J441 Chronic obstructive pulmonary disease with (acute) exacerbation: Secondary | ICD-10-CM

## 2017-05-21 HISTORY — DX: Gastro-esophageal reflux disease without esophagitis: K21.9

## 2017-05-21 HISTORY — DX: Diaphragmatic hernia without obstruction or gangrene: K44.9

## 2017-05-21 MED ORDER — CIPROFLOXACIN HCL 250 MG PO TABS: 500.0000 mg | ORAL_TABLET | Freq: Two times a day (BID) | ORAL | 0 refills | Status: DC

## 2017-05-21 MED ORDER — CIPROFLOXACIN HCL 250 MG PO TABS: 250.0000 mg | ORAL_TABLET | Freq: Two times a day (BID) | ORAL | 0 refills | Status: DC

## 2017-05-21 MED ORDER — PREDNISONE 10 MG PO TABS: ORAL_TABLET | ORAL | 0 refills | Status: DC

## 2017-05-21 MED ORDER — ALBUTEROL SULFATE (2.5 MG/3ML) 0.083% IN NEBU: 2.5000 mg | INHALATION_SOLUTION | Freq: Four times a day (QID) | RESPIRATORY_TRACT | 0 refills | Status: DC | PRN

## 2017-05-21 NOTE — ED Provider Notes (Addendum)
[facility]    CSN: [id number] Arrival date & time: 05/21/17  1478     History   Chief Complaint Chief Complaint  Patient presents with  . Cough  . Shortness of Breath    HPI Charles Webster is a 72 y.o. male.   HPI  72 year old male presents symptoms of a COPD exacerbation.  He states that he was seen here April 3 for COPD exacerbation due to the seasonal pollen.  That time he was provided with prednisone doxycycline and refill of albuterol.  Unable to tolerate the doxycycline.  He returns today with a productive cough of colorless phlegm shortness of breath; the same symptoms.  He states that he has this every spring time when the pollen is worse.  He has had no fever or chills.  O2 sats are 96% on room air.  He did have 3 days of feeling improvement after starting the doxycycline ,but was unable to complete it due to stomach problems.  Goes on to tell me that the only antibiotic that he has found effective for his COPD has been Cipro 250 mg twice daily.  All other medications caused him to have side effects,and  not efficacious.          Past Medical History:  Diagnosis Date  . Bronchitis   . COPD (chronic obstructive pulmonary disease) (HCC)   . Diverticulitis   . GERD (gastroesophageal reflux disease)   . Hiatal hernia     Patient Active Problem List   Diagnosis Date Noted  . COPD exacerbation (HCC) 05/05/2016    Past Surgical History:  Procedure Laterality Date  . LUNG REMOVAL, PARTIAL  2005   removed 20% of right lung       Home Medications    Prior to Admission medications   Medication Sig Start Date End Date Taking? Authorizing Provider  Fluticasone-Salmeterol (WIXELA INHUB) 250-50 MCG/DOSE AEPB Inhale 1 puff into the lungs 2 (two) times daily.   Yes [provider]  albuterol (PROVENTIL) (2.5 MG/3ML) 0.083% nebulizer solution Take 3 mLs (2.5 mg total) by nebulization every 6 (six) hours as needed for wheezing or shortness of  breath. 05/21/17   Lutricia Feil, PA-C  cetirizine (ZYRTEC) 10 MG tablet Take 10 mg by mouth daily.    [provider]  ciprofloxacin (CIPRO) 250 MG tablet Take 1 tablet (250 mg total) by mouth 2 (two) times daily. 05/21/17   Lutricia Feil, PA-C  omeprazole (PRILOSEC) 40 MG capsule Take 40 mg by mouth daily.    [provider]  predniSONE (DELTASONE) 10 MG tablet Start 60 mg po day one, then 50 mg po day two, taper by 10 mg daily until complete. 05/21/17   Lutricia Feil, PA-C  Spacer/Aero-Holding Chambers (AEROCHAMBER PLUS) inhaler Use as instructed 05/05/16   Domenick Gong, MD  tiotropium (SPIRIVA) 18 MCG inhalation capsule Place 1 capsule (18 mcg total) into inhaler and inhale daily. 05/06/16   Alford Highland, MD    Family History Family History  Problem Relation Age of Onset  . Heart disease Mother   . Kidney failure Father     Social History Social History   Tobacco Use  . Smoking status: Former Smoker    Packs/day: 1.00    Years: 40.00    Pack years: 40.00    Last attempt to quit: 10/04/2003    Years since quitting: 13.6  . Smokeless tobacco: Never Used  Substance Use Topics  . Alcohol use: No  .  Drug use: No     Allergies   Alpha blocker quinazolines; Cephalexin; Clindamycin/lincomycin; Doxycycline; Levaquin [levofloxacin]; Penicillins; Sulfa antibiotics; and Tamsulosin   Review of Systems Review of Systems  Constitutional: Positive for activity change. Negative for chills, fatigue and fever.  Respiratory: Positive for cough and shortness of breath. Negative for wheezing.   All other systems reviewed and are negative.    Physical Exam Triage Vital Signs ED Triage Vitals  Enc Vitals Group     BP 05/21/17 0828 103/78     Pulse Rate 05/21/17 0828 97     Resp 05/21/17 0828 20     Temp 05/21/17 0828 98.3 F (36.8 C)     Temp Source 05/21/17 0828 Oral     SpO2 05/21/17 0828 96 %     Weight --      Height --      Head Circumference  --      Peak Flow --      Pain Score 05/21/17 0829 2     Pain Loc --      Pain Edu? --      Excl. in GC? --    No data found.  Updated Vital Signs BP 103/78 (BP Location: Left Arm)   Pulse 97   Temp 98.3 F (36.8 C) (Oral)   Resp 20   SpO2 96%   Visual Acuity Right Eye Distance:   Left Eye Distance:   Bilateral Distance:    Right Eye Near:   Left Eye Near:    Bilateral Near:     Physical Exam  Constitutional: He is oriented to person, place, and time. He appears well-developed and well-nourished.  Non-toxic appearance. He does not appear ill. No distress.  HENT:  Head: Normocephalic.  Eyes: Pupils are equal, round, and reactive to light.  Neck: Normal range of motion. Neck supple.  Pulmonary/Chest: Effort normal and breath sounds normal.  Musculoskeletal: Normal range of motion.  Neurological: He is alert and oriented to person, place, and time.  Skin: Skin is warm and dry.  Psychiatric: He has a normal mood and affect. His behavior is normal.  Nursing note and vitals reviewed.    UC Treatments / Results  Labs (all labs ordered are listed, but only abnormal results are displayed) Labs Reviewed - No data to display  EKG None Radiology No results found.  Procedures Procedures (including critical care time)  Medications Ordered in UC Medications - No data to display   Initial Impression / Assessment and Plan / UC Course  I have reviewed the triage vital signs and the nursing notes.  Pertinent labs & imaging results that were available during my care of the patient were reviewed by me and considered in my medical decision making (see chart for details).     Plan: 1. Test/x-ray results and diagnosis reviewed with patient 2. rx as per orders; risks, benefits, potential side effects reviewed with patient 3. Recommend supportive treatment with use of the albuterol nebulizer and inhaler as necessary for shortness of breath.  We will prescribeCipro 250 twice  daily that he states works best for him.  He is aware of the black box warnings.  Have highly recommended that he follow-up with a pulmonologist due to his frequent exacerbations. 4. F/u prn if symptoms worsen or don't improve   Final Clinical Impressions(s) / UC Diagnoses   Final diagnoses:  COPD exacerbation [facility])    ED Discharge Orders        Ordered  predniSONE (DELTASONE) 10 MG tablet     05/21/17 0904    albuterol (PROVENTIL) (2.5 MG/3ML) 0.083% nebulizer solution  Every 6 hours PRN     05/21/17 0904    ciprofloxacin (CIPRO) 250 MG tablet  2 times daily,   Status:  Discontinued     05/21/17 0904    ciprofloxacin (CIPRO) 250 MG tablet  2 times daily     05/21/17 0914       Controlled Substance Prescriptions [place] Controlled Substance Registry consulted? Not Applicable   Lutricia FeilRoemer, William P, PA-C 05/21/17 [id number]    Lutricia Feiloemer, William P, PA-C 05/21/17 1129

## 2017-05-21 NOTE — ED Triage Notes (Signed)
Pt reports he was seen April 3rd and diagnosed with bronchitis. Took only 5 days of the ABX and then it made him sick so he stopped. Had only about 3 days of feeling better and then sx returned. Cough productive of colorless phlegm, shortness of breath. Pain 2/10

## 2017-06-06 ENCOUNTER — Ambulatory Visit (INDEPENDENT_AMBULATORY_CARE_PROVIDER_SITE_OTHER): Payer: Medicare Other | Admitting: Internal Medicine

## 2017-06-06 ENCOUNTER — Encounter: Payer: Self-pay | Admitting: Internal Medicine

## 2017-06-06 VITALS — BP 132/80 | HR 78 | Ht 66.0 in | Wt 134.0 lb

## 2017-06-06 DIAGNOSIS — J449 Chronic obstructive pulmonary disease, unspecified: Secondary | ICD-10-CM | POA: Diagnosis not present

## 2017-06-06 MED ORDER — GUAIFENESIN-CODEINE 100-10 MG/5ML PO SOLN: 5.0000 mL | ORAL | 0 refills | Status: DC | PRN

## 2017-06-06 MED ORDER — BUDESONIDE 0.5 MG/2ML IN SUSP: 0.5000 mg | Freq: Two times a day (BID) | RESPIRATORY_TRACT | 5 refills | Status: DC

## 2017-06-06 NOTE — Patient Instructions (Addendum)
Continue inhalers as prescribed CONTINUE ALBUTEROL NEBS AS PRESCRIBED   START PULMICORT NEBS  FOLLOW UP IN 1 month for re-assessment Check Office SPiro at next visit

## 2017-06-06 NOTE — Progress Notes (Signed)
Name: Charles Webster MRN: [id number] DOB: 1945/11/02     CONSULTATION DATE: 5.9.19 REFERRING MD : Quentin Cornwall  CHIEF COMPLAINT: COPD  STUDIES:   4.7.18   CXR independently reviewed by Me  Increased lung volumes No pneumonia No effusions   HISTORY OF PRESENT ILLNESS: 72 year old pleasant white male seen today for assessment for COPD Patient states he has been diagnosed with COPD for approximately 2 years ago however based on further questioning patient has chronic shortness of breath and progressive dyspnea exertion for the past 5 years  Patient has a history of tobacco abuse 1 pack a day for 40 years Patient worked at the [employer] and was exposed to paints 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 Based on his social history patient has a lot of advised mental exposures that would put him at risk for COPD  At this time patient states that he has progressive shortness of breath over the last several months in conjunction with allergic rhinitis Patient was diagnosed with bronchitis pneumonia 2 years ago was started on Advair and did well in approximately 1 year ago was admitted for COPD exacerbation and then was started on Spiriva  His inhaler therapy was working fine according to him but seem to have stopped working over the last several months Patient has chronic chest congestion and cough chronic cough with the productive sputum  No signs of infection at this time no active wheezing at this time no respiratory distress at this time  Patient states 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 at [facility]   PAST MEDICAL HISTORY :   has a past medical history of Bronchitis, COPD (chronic obstructive pulmonary disease) (HCC), Diverticulitis, GERD (gastroesophageal reflux disease), and Hiatal hernia.  has a past surgical history that includes Lung removal, partial (2005). Prior to Admission medications     Medication Sig Start Date End Date Taking? Authorizing Provider  albuterol (PROVENTIL) (2.5 MG/3ML) 0.083% nebulizer solution Take 3 mLs (2.5 mg total) by nebulization every 6 (six) hours as needed for wheezing or shortness of breath. 05/21/17   Lutricia Feil, PA-C  cetirizine (ZYRTEC) 10 MG tablet Take 10 mg by mouth daily.    [provider]  ciprofloxacin (CIPRO) 250 MG tablet Take 1 tablet (250 mg total) by mouth 2 (two) times daily. 05/21/17   Lutricia Feil, PA-C  Fluticasone-Salmeterol (WIXELA INHUB) 250-50 MCG/DOSE AEPB Inhale 1 puff into the lungs 2 (two) times daily.    [provider]  omeprazole (PRILOSEC) 40 MG capsule Take 40 mg by mouth daily.    [provider]  predniSONE (DELTASONE) 10 MG tablet Start 60 mg po day one, then 50 mg po day two, taper by 10 mg daily until complete. 05/21/17   Lutricia Feil, PA-C  Spacer/Aero-Holding Chambers (AEROCHAMBER PLUS) inhaler Use as instructed 05/05/16   Domenick Gong, MD  tiotropium (SPIRIVA) 18 MCG inhalation capsule Place 1 capsule (18 mcg total) into inhaler and inhale daily. 05/06/16   Alford Highland, MD   Allergies  Allergen Reactions  . Alpha Blocker Quinazolines   . Cephalexin Other (See Comments)  . Clindamycin/Lincomycin Nausea Only  . Doxycycline Other (See Comments)  . Levaquin [Levofloxacin]   . Penicillins   . Sulfa Antibiotics Other (See Comments)  . Tamsulosin Other (See Comments)    FAMILY HISTORY:  family history includes Heart disease in his mother; Kidney failure in his father. SOCIAL  HISTORY:  reports that he quit smoking about 13 years ago. He has a 40.00 pack-year smoking history. He has never used smokeless tobacco. He reports that he does not drink alcohol or use drugs.  REVIEW OF SYSTEMS:   Constitutional: Negative for fever, chills, weight loss, malaise/fatigue and diaphoresis.  HENT: Negative for hearing loss, ear pain, nosebleeds, congestion, sore throat, neck  pain, tinnitus and ear discharge.   Eyes: Negative for blurred vision, double vision, photophobia, pain, discharge and redness.  Respiratory: + cough, hemoptysis, sputum production,+ shortness of breath,+ wheezing and stridor.   Cardiovascular: Negative for chest pain, palpitations, orthopnea, claudication, leg swelling and PND.  Gastrointestinal: Negative for heartburn, nausea, vomiting, abdominal pain, diarrhea, constipation, blood in stool and melena.  Genitourinary: Negative for dysuria, urgency, frequency, hematuria and flank pain.  Musculoskeletal: Negative for myalgias, back pain, joint pain and falls.  Skin: Negative for itching and rash.  Neurological: Negative for dizziness, tingling, tremors, sensory change, speech change, focal weakness, seizures, loss of consciousness, weakness and headaches.  Endo/Heme/Allergies: Negative for environmental allergies and polydipsia. Does not bruise/bleed easily.  ALL OTHER ROS ARE NEGATIVE  BP 132/80 (BP Location: Right Arm, Cuff Size: Normal)   Pulse 78   Ht  (1.676 m)   Wt 134 lb (60.8 kg)   SpO2 97%   BMI 21.63 kg/m    Physical Examination:   GENERAL:NAD, no fevers, chills, no weakness no fatigue HEAD: Normocephalic, atraumatic.  EYES: Pupils equal, round, reactive to light. Extraocular muscles intact. No scleral icterus.  MOUTH: Moist mucosal membrane.   EAR, NOSE, THROAT: Clear without exudates. No external lesions.  NECK: Supple. No thyromegaly. No nodules. No JVD.  PULMONARY:CTA B/L no wheezes, no crackles, no rhonchi CARDIOVASCULAR: S1 and S2. Regular rate and rhythm. No murmurs, rubs, or gallops. No edema.  GASTROINTESTINAL: Soft, nontender, nondistended. No masses. Positive bowel sounds.  MUSCULOSKELETAL: No swelling, clubbing, or edema. Range of motion full in all extremities.  NEUROLOGIC: Cranial nerves II through XII are intact. No gross focal neurological deficits.  SKIN: No ulceration, lesions, rashes, or cyanosis.  Skin warm and dry. Turgor intact.  PSYCHIATRIC: Mood, affect within normal limits. The patient is awake, alert and oriented x 3. Insight, judgment intact.      ASSESSMENT / PLAN: 72 year old pleasant white male seen today for assessment for COPD and at this time patient has debilitating chronic shortness of breath and dyspnea on exertion that has been progressive over the last several years and has progressively worsened over the last several months.  These findings suggest underlying emphysema with chronic bronchitis.  patient has been on aggressive inhaler therapy with Advair and Spiriva and this seems to have stopped working at this time patient also uses albuterol nebulized therapy which does not seem to help a whole lot.  I have explained to patient that we may have to try alternative modes of therapy including scheduled nebulized therapy with Pulmicort  At this time it seems that his COPD has progressively worsened to moderate to severe but is not able to perform office spirometry at this time  Recommend continuing Advair and Spiriva as prescribed We will add Pulmicort nebulizer therapy twice daily for 1 month Continue albuterol nebulizer therapy as needed every 4 hours will not prescribe prednisone at this time Continue Zyrtec as prescribed  Patient will need to follow-up in 1 month to assess his respiratory status with office spirometry inhaler and nebulized therapy and also will need to assess for hypoxia with ONO  and 6-minute walk test     Patient  satisfied with Plan of action and management. All questions answered Follow up in 1 month   Edwen Mclester Santiago Glad, M.D.  [facility]  Medical Director [facility] Medical Director [facility] Cardio-Pulmonary Department

## 2017-07-03 ENCOUNTER — Other Ambulatory Visit: Payer: Self-pay

## 2017-07-05 ENCOUNTER — Telehealth: Payer: Self-pay | Admitting: Internal Medicine

## 2017-07-05 MED ORDER — ALBUTEROL SULFATE (2.5 MG/3ML) 0.083% IN NEBU: 2.5000 mg | INHALATION_SOLUTION | Freq: Four times a day (QID) | RESPIRATORY_TRACT | 5 refills | Status: DC | PRN

## 2017-07-05 NOTE — Telephone Encounter (Signed)
°*  STAT* If patient is at the pharmacy, call can be transferred to refill team.   1. Which medications need to be refilled? (please list name of each medication and dose if known)     Albuterol neb 3 ml q 6 h prn  2. Which pharmacy/location (including street and city if local pharmacy) is medication to be sent to?    [facility] [address] [place]   3. Do they need a 30 day or 90 day supply? 90   PLEASE CALL PATIENT TO CONFIRM SENT

## 2017-07-05 NOTE — Telephone Encounter (Signed)
rx has been sent 

## 2017-07-11 ENCOUNTER — Encounter: Payer: Self-pay | Admitting: Internal Medicine

## 2017-07-11 ENCOUNTER — Ambulatory Visit (INDEPENDENT_AMBULATORY_CARE_PROVIDER_SITE_OTHER): Payer: Medicare Other | Admitting: Internal Medicine

## 2017-07-11 ENCOUNTER — Telehealth: Payer: Self-pay | Admitting: Internal Medicine

## 2017-07-11 VITALS — BP 120/76 | HR 90 | Resp 16 | Ht 66.0 in | Wt 131.0 lb

## 2017-07-11 DIAGNOSIS — J449 Chronic obstructive pulmonary disease, unspecified: Secondary | ICD-10-CM | POA: Diagnosis not present

## 2017-07-11 MED ORDER — PREDNISONE 20 MG PO TABS: 40.0000 mg | ORAL_TABLET | Freq: Every day | ORAL | 0 refills | Status: DC

## 2017-07-11 MED ORDER — AZITHROMYCIN 250 MG PO TABS: ORAL_TABLET | ORAL | 0 refills | Status: DC

## 2017-07-11 NOTE — Telephone Encounter (Signed)
Pt called and requesting nurse to return call in regards to pt's steroid that he was prescribed today.  Rhonda J Cobb

## 2017-07-11 NOTE — Progress Notes (Signed)
Name: Charles Webster MRN: [id number] DOB: 1945/11/17     CONSULTATION DATE: 5.9.19 REFERRING MD : Quentin CornwallSanani  CHIEF COMPLAINT: COPD  STUDIES:   4.7.18   CXR independently reviewed by Me  Increased lung volumes No pneumonia No effusions   Previous HISTORY OF PRESENT ILLNESS: 72 year old pleasant white male seen today for assessment for COPD Patient states he has been diagnosed with COPD for approximately 2 years ago however based on further questioning patient has chronic shortness of breath and progressive dyspnea exertion for the past 5 years  Patient has a history of tobacco abuse 1 pack a day for 40 years Patient worked at the [employer] and was exposed to paints 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 Based on his social history patient has a lot of advised mental exposures that would put him at risk for COPD  At this time patient states that he has progressive shortness of breath over the last several months in conjunction with allergic rhinitis Patient was diagnosed with bronchitis pneumonia 2 years ago was started on Advair and did well in approximately 1 year ago was admitted for COPD exacerbation and then was started on Spiriva  His inhaler therapy was working fine according to him but seem to have stopped working over the last several months Patient has chronic chest congestion and cough chronic cough with the productive sputum  No signs of infection at this time no active wheezing at this time no respiratory distress at this time  Patient states 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    CURRENT HPI Still SOB and DOE +cough and wheezing pulmicort did not help Previous 40 pack year tobacco abuse No signs of CHF +mild COPD exacerbation at this time   PAST MEDICAL HISTORY  REVIEW OF SYSTEMS:   Constitutional: Negative for fever, chills, weight loss, malaise/fatigue and  diaphoresis.  HENT: Negative for hearing loss, ear pain, nosebleeds, congestion, sore throat, neck pain, tinnitus and ear discharge.   Eyes: Negative for blurred vision, double vision, photophobia, pain, discharge and redness.  Respiratory: + cough, hemoptysis, sputum production,+ shortness of breath,+ wheezing and stridor.   Cardiovascular: Negative for chest pain, palpitations, orthopnea, claudication, leg swelling and PND.   ALL OTHER ROS ARE NEGATIVE  BP 120/76 (BP Location: Left Arm, Cuff Size: Normal)   Pulse 90   Resp 16   Ht 5\' 6"  (1.676 m)   Wt 131 lb (59.4 kg)   SpO2 94%   BMI 21.14 kg/m    Physical Examination:   GENERAL:NAD, no fevers, chills, no weakness no fatigue HEAD: Normocephalic, atraumatic.  EYES: Pupils equal, round, reactive to light. Extraocular muscles intact. No scleral icterus.  MOUTH: Moist mucosal membrane.   EAR, NOSE, THROAT: Clear without exudates. No external lesions.  NECK: Supple. No thyromegaly. No nodules. No JVD.  PULMONARY:CTA B/L no wheezes, no crackles, no rhonchi CARDIOVASCULAR: S1 and S2. Regular rate and rhythm. No murmurs, rubs, or gallops. No edema.  GASTROINTESTINAL: Soft, nontender, nondistended. No masses. Positive bowel sounds.  MUSCULOSKELETAL: No swelling, clubbing, or edema. Range of motion full in all extremities.  NEUROLOGIC: Cranial nerves II through XII are intact. No gross focal neurological deficits.  SKIN: No ulceration, lesions, rashes, or cyanosis. Skin warm and dry. Turgor intact.  PSYCHIATRIC: Mood, affect within normal limits. The patient is awake, alert and oriented x 3. Insight, judgment intact.  ASSESSMENT / PLAN: 72 year old pleasant white male seen today for follow up  assessment for COPD and at this time patient has debilitating chronic shortness of breath and dyspnea on exertion that has been progressive over the last several years and has progressively worsened over the last several months.    These  findings suggest underlying emphysema with chronic bronchitis.   patient has been on aggressive inhaler therapy with Advair and Spiriva  patient also uses albuterol nebulized therapy which does not seem to help a whole lot.    At this time it seems that his COPD has progressively worsened to moderate to severe but is not able to perform office spirometry at this time  Recommend continuing Advair and Spiriva as prescribed Continue albuterol nebulizer therapy as needed every 4 hours will  prescribe prednisone at this time Continue Zyrtec as prescribed Oral abx at this time will need to assess for hypoxia with ONO and 6-minute walk test Will need to consider pulmo rehab referral     Patient  satisfied with Plan of action and management. All questions answered Follow up in 3 months   Amier Hoyt Santiago Glad, M.D.  [facility]  Medical Director [facility] Medical Director [facility] Cardio-Pulmonary Department

## 2017-07-11 NOTE — Patient Instructions (Signed)
Check ONO and Continue inhalers as prescribed Start z pak and Prednisone therapy

## 2017-07-11 NOTE — Telephone Encounter (Signed)
Pharmacy dispensed prednisone as 10mg  and instructed patient to take 4 tabs daily. Dr. Clovis FredricksonKasa's rx stated 20mg  (2) tab daily total 40 mg daily for 10 days. Re-interated same but different dose. Pt thought he was getting prednisone taper. Pt verbalized understanding; Nothing further needed.

## 2017-07-12 ENCOUNTER — Ambulatory Visit: Payer: Medicare Other

## 2017-07-16 ENCOUNTER — Encounter: Payer: Self-pay | Admitting: Internal Medicine

## 2017-07-17 DIAGNOSIS — J449 Chronic obstructive pulmonary disease, unspecified: Secondary | ICD-10-CM | POA: Diagnosis not present

## 2017-07-19 ENCOUNTER — Telehealth: Payer: Self-pay | Admitting: *Deleted

## 2017-07-19 DIAGNOSIS — J449 Chronic obstructive pulmonary disease, unspecified: Secondary | ICD-10-CM

## 2017-07-19 NOTE — Telephone Encounter (Signed)
Pt aware of results. Orders placed  Nothing further needed. 

## 2017-07-22 ENCOUNTER — Ambulatory Visit: Payer: Medicare Other

## 2017-07-23 ENCOUNTER — Ambulatory Visit (INDEPENDENT_AMBULATORY_CARE_PROVIDER_SITE_OTHER): Payer: Medicare Other | Admitting: *Deleted

## 2017-07-23 DIAGNOSIS — J441 Chronic obstructive pulmonary disease with (acute) exacerbation: Secondary | ICD-10-CM

## 2017-07-23 NOTE — Progress Notes (Signed)
SIX MIN WALK 07/23/2017  Medications Albuterol neb/ Pulmicort 0.5 mL/Zyrtec 10 mg/Wixela 250/50/Spiriva 18 mcg  Supplimental Oxygen during Test? (L/min) No  Laps 6  Partial Lap (in Meters) 2  Baseline BP (sitting) 122/70  Baseline Heartrate 85  Baseline Dyspnea (Borg Scale) 2  Baseline Fatigue (Borg Scale) 3  Baseline SPO2 96  BP (sitting) 120/68  Heartrate 108  Dyspnea (Borg Scale) 3  Fatigue (Borg Scale) 3  SPO2 89  BP (sitting) 118/70  Heartrate 75  SPO2 92  Stopped or Paused before Six Minutes No  Interpretation Dizziness  Distance Completed 290    SMW performed today

## 2017-07-23 NOTE — Patient Instructions (Signed)
10/18/17

## 2017-07-29 ENCOUNTER — Telehealth: Payer: Self-pay | Admitting: Internal Medicine

## 2017-07-29 MED ORDER — FLUTICASONE-SALMETEROL 250-50 MCG/DOSE IN AEPB: 1.0000 | INHALATION_SPRAY | Freq: Two times a day (BID) | RESPIRATORY_TRACT | 3 refills | Status: DC

## 2017-07-29 NOTE — Telephone Encounter (Signed)
rx sent

## 2017-07-29 NOTE — Telephone Encounter (Signed)
*  STAT* If patient is at the pharmacy, call can be transferred to refill team.   1. Which medications need to be refilled? (please list name of each medication and dose if known) Wixela  2. Which pharmacy/location (including street and city if local pharmacy) is medication to be sent to? [facility] [place]  3. Do they need a 30 day or 90 day supply? 30

## 2017-11-06 ENCOUNTER — Encounter: Payer: Self-pay | Admitting: Internal Medicine

## 2017-11-06 ENCOUNTER — Ambulatory Visit (INDEPENDENT_AMBULATORY_CARE_PROVIDER_SITE_OTHER): Payer: Medicare Other | Admitting: Internal Medicine

## 2017-11-06 VITALS — BP 118/70 | HR 76 | Ht 63.5 in | Wt 134.0 lb

## 2017-11-06 DIAGNOSIS — J9611 Chronic respiratory failure with hypoxia: Secondary | ICD-10-CM

## 2017-11-06 DIAGNOSIS — J449 Chronic obstructive pulmonary disease, unspecified: Secondary | ICD-10-CM | POA: Diagnosis not present

## 2017-11-06 MED ORDER — TIOTROPIUM BROMIDE MONOHYDRATE 1.25 MCG/ACT IN AERS: 2.0000 | INHALATION_SPRAY | Freq: Every day | RESPIRATORY_TRACT | 0 refills | Status: DC

## 2017-11-06 MED ORDER — TIOTROPIUM BROMIDE MONOHYDRATE 1.25 MCG/ACT IN AERS: 2.0000 | INHALATION_SPRAY | Freq: Two times a day (BID) | RESPIRATORY_TRACT | 5 refills | Status: DC

## 2017-11-06 MED ORDER — PREDNISONE 20 MG PO TABS: 20.0000 mg | ORAL_TABLET | Freq: Every day | ORAL | 0 refills | Status: DC

## 2017-11-06 NOTE — Patient Instructions (Addendum)
Continue inhalers as prescribed ALBUTEROL NEBS as needed  Will try SPIRIVA RESPIMAT  Continue Oxygen as prescribed  Start prednisone 20 mg daily for 5 days

## 2017-11-06 NOTE — Progress Notes (Signed)
Name: Charles Webster MRN: [id number] DOB: 11/01/1945     CONSULTATION DATE: 5.9.19 REFERRING MD : Quentin Cornwall  CHIEF COMPLAINT: COPD  STUDIES:   4.7.18   CXR independently reviewed by Me  Increased lung volumes No pneumonia No effusions   Previous HISTORY OF PRESENT ILLNESS: 72 year old pleasant white male seen today for assessment for COPD Patient states he has been diagnosed with COPD for approximately 2 years ago however based on further questioning patient has chronic shortness of breath and progressive dyspnea exertion for the past 5 years  Patient has a history of tobacco abuse 1 pack a day for 40 years Patient worked at the [employer] and was exposed to paints 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 Based on his social history patient has a lot of advised mental exposures that would put him at risk for COPD  At this time patient states that he has progressive shortness of breath over the last several months in conjunction with allergic rhinitis Patient was diagnosed with bronchitis pneumonia 2 years ago was started on Advair and did well in approximately 1 year ago was admitted for COPD exacerbation and then was started on Spiriva  His inhaler therapy was working fine according to him but seem to have stopped working over the last several months Patient has chronic chest congestion and cough chronic cough with the productive sputum  No signs of infection at this time no active wheezing at this time no respiratory distress at this time  Patient states 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 at [facility]    CURRENT HPI Still remain with shortness of breath Positive dyspnea exertion Intermittent cough and wheezing  Previous 40 pack year tobacco abuse No signs of CHF  Patient has chest congestion and cough Has mild COPD exacerbation at this time  No respiratory distress Tries to stay  active No second hand smoke exposure     Review of Systems:  Gen:  Denies  fever, sweats, chills weigh loss  HEENT: Denies blurred vision, double vision, ear pain, eye pain, hearing loss, nose bleeds, sore throat Cardiac:  No dizziness, chest pain or heaviness, chest tightness,edema, No JVD Resp:   +cough +sputum production, +shortness of breath,+wheezing, -hemoptysis,  Gi: Denies swallowing difficulty, stomach pain, nausea or vomiting, diarrhea, constipation, bowel incontinence Gu:  Denies bladder incontinence, burning urine Ext:   Denies Joint pain, stiffness or swelling Skin: Denies  skin rash, easy bruising or bleeding or hives Endoc:  Denies polyuria, polydipsia , polyphagia or weight change Psych:   Denies depression, insomnia or hallucinations  Other:  All other systems negative  ALL OTHER ROS ARE NEGATIVE  BP 118/70 (BP Location: Left Arm, Patient Position: Sitting, Cuff Size: Normal)   Pulse 76   Ht 5' 3.5" (1.613 m)   Wt 134 lb (60.8 kg)   SpO2 98%   BMI 23.36 kg/m    Physical Examination:   GENERAL:NAD, no fevers, chills, no weakness no fatigue HEAD: Normocephalic, atraumatic.  EYES: Pupils equal, round, reactive to light. Extraocular muscles intact. No scleral icterus.  MOUTH: Moist mucosal membrane. Dentition intact. No abscess noted.  EAR, NOSE, THROAT: Clear without exudates. No external lesions.  NECK: Supple. No thyromegaly. No nodules. No JVD.  PULMONARY:+ wheezing, CARDIOVASCULAR: S1 and S2. Regular rate and rhythm. No murmurs, rubs, or gallops. No edema. Pedal pulses 2+ bilaterally.  GASTROINTESTINAL: Soft, nontender, nondistended. No masses. Positive bowel sounds.  No hepatosplenomegaly.  MUSCULOSKELETAL: No swelling, clubbing, or edema. Range of motion full in all extremities.  NEUROLOGIC: Cranial nerves II through XII are intact. No gross focal neurological deficits. Sensation intact. Reflexes intact.  SKIN: No ulceration, lesions, rashes, or  cyanosis. Skin warm and dry. Turgor intact.  PSYCHIATRIC: Mood, affect within normal limits. The patient is awake, alert and oriented x 3. Insight, judgment intact.  ALL OTHER ROS ARE NEGATIVE       ASSESSMENT / PLAN: 72 year old pleasant white male seen today for follow-up assessment for COPD with debilitating chronic shortness of breath and dyspnea on exertion that has been progressive over the last several years In the setting of chronic hypoxic respiratory failure from COPD with underlying deconditioned state  He has underlying emphysema with chronic cough and chronic chronic bronchitis and is on [facility] which is a inhaled steroid and long-acting beta agonist along with Spiriva HandiHaler Patient notes that there is some residual powder on his Spiriva HandiHaler intermittently I have suggested that we try Spiriva Respimat to see if this helps   I have discussed his overnight pulse oximetry results which shows hypoxia for short period of time intermittently throughout the night  #1 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 All questions answered Follow up in 6 months   Karnell Vanderloop Santiago Glad, M.D.  [facility]  Medical Director [facility] Medical Director [facility] Cardio-Pulmonary Department

## 2017-11-06 NOTE — Addendum Note (Signed)
Addended by: Erlinda Hong on: 11/06/2017 10:16 AM   Modules accepted: Orders

## 2017-11-08 DIAGNOSIS — Z23 Encounter for immunization: Secondary | ICD-10-CM | POA: Diagnosis not present

## 2017-11-13 ENCOUNTER — Telehealth: Payer: Self-pay | Admitting: Internal Medicine

## 2017-11-13 NOTE — Telephone Encounter (Signed)
Patient calling stating since being here last time, we placed him on steroids and he states it is not helping with his cough He states it is getting worst. He states it is going towards Bronchitis  Please call back

## 2017-11-13 NOTE — Telephone Encounter (Signed)
Spoke to patient, set him up for acute visit tomorrow. Nothing further at this time.

## 2017-11-14 ENCOUNTER — Encounter: Payer: Self-pay | Admitting: Pulmonary Disease

## 2017-11-14 ENCOUNTER — Ambulatory Visit (INDEPENDENT_AMBULATORY_CARE_PROVIDER_SITE_OTHER): Payer: Medicare Other | Admitting: Pulmonary Disease

## 2017-11-14 VITALS — BP 110/70 | HR 94 | Resp 16 | Ht 63.5 in | Wt 136.0 lb

## 2017-11-14 DIAGNOSIS — J440 Chronic obstructive pulmonary disease with acute lower respiratory infection: Secondary | ICD-10-CM | POA: Diagnosis not present

## 2017-11-14 DIAGNOSIS — J441 Chronic obstructive pulmonary disease with (acute) exacerbation: Secondary | ICD-10-CM | POA: Diagnosis not present

## 2017-11-14 DIAGNOSIS — J209 Acute bronchitis, unspecified: Secondary | ICD-10-CM | POA: Diagnosis not present

## 2017-11-14 MED ORDER — AZITHROMYCIN 250 MG PO TABS: ORAL_TABLET | ORAL | 0 refills | Status: AC

## 2017-11-14 NOTE — Progress Notes (Signed)
   Subjective:    Patient ID: Charles Webster, male    DOB: 24-Jun-1945, 72 y.o.   MRN: [id number]  HPI patient is a 72 year old, former smoker, who follows usually with Dr. Belia Heman. He was seen by Dr. Belia Heman on 9 October. At that time he was noted to have decompensated COPD. He presents today for an acute visit. He was switched to Spiriva Respimat from [facility] and was started on nocturnal oxygen. He was also noted to have a mild exacerbation and was prescribed prednisone 20 mg for five days. Since that time he has noted persistent to slightly worse symptoms. He has noted tenacious secretions. He states that he uses nebulizer religiously yesterday and feels that this has helped some. He did feel some chills last night but has had no fevers or sweats. No chest pain. Cough is productive of yellowish to greenish sputum. He has had no hemoptysis. Patient notes that he has seasonal variation with his symptoms that that he usually has episodes like this once or twice a year.  Review of Systems  Constitutional: Positive for chills and fatigue.  HENT: Positive for congestion and sinus pressure.   Eyes: Negative.   Respiratory: Positive for cough, chest tightness, shortness of breath and wheezing.   Cardiovascular: Negative.   Gastrointestinal: Negative.   Skin: Negative.   All other systems reviewed and are negative.      Objective:   Physical Exam  Constitutional: He is oriented to person, place, and time. He appears well-developed and well-nourished.  He is somewhat tachypneic.  HENT:  Head: Normocephalic and atraumatic.  Mouth/Throat: No oropharyngeal exudate.  Eyes: Pupils are equal, round, and reactive to light. Conjunctivae and EOM are normal. No scleral icterus.  Neck: Neck supple. No JVD present. No tracheal deviation present. No thyromegaly present.  Cardiovascular: Normal rate, regular rhythm, normal heart sounds and intact distal pulses.  Pulmonary/Chest: No stridor. He has no  wheezes. He has no rales. He exhibits no tenderness.  Dyspneic, coarse breath sounds throughout.  Abdominal: Soft. Bowel sounds are normal. He exhibits no distension.  Musculoskeletal: He exhibits no edema.  Lymphadenopathy:    He has no cervical adenopathy.  Neurological: He is alert and oriented to person, place, and time.  No focal deficits.  Skin: Skin is warm and dry. No rash noted. No erythema. No pallor.  Psychiatric: He has a normal mood and affect. Thought content normal.  Nursing note and vitals reviewed.         Assessment & Plan:   1)  Acute bronchitis in the setting of COPD:  will treat with Azithromycin dose pak.  2) COPD with acute exacerbation: he will be placed on a prednisone taper over the next week and a half. We will start 40 mg of prednisone daily and taper down to 10 mg daily until seen again in the office. Patient will continue use of nebulizer treatments with albuterol at home as well as his regular inhalers. Patient has been instructed to call us patient has been instructed to call if his symptoms continue to deteriorate or worsen. Alternatively, he can present to the emergency room if needed.  3) He will be seen in follow-up in one weeks time he is to contact us prior to that time should any new difficulties arise.  4) Flu vaccine not given today as the patient is acutely ill and requires steroids.

## 2017-11-14 NOTE — Patient Instructions (Signed)
We will decrease your prednisone gradually:  Take two tablets daily for three days this will be 40 mg daily then,  Take one and a half tablets daily for three days this will be 30 mg daily then,  Take one tablet daily for three days this will be 20 mg then,  Half a tablet daily until you are seen again in the office.   We have called Z pak to your pharmacy.

## 2017-11-15 ENCOUNTER — Encounter: Payer: Self-pay | Admitting: Pulmonary Disease

## 2017-11-21 ENCOUNTER — Encounter: Payer: Self-pay | Admitting: Pulmonary Disease

## 2017-11-21 ENCOUNTER — Ambulatory Visit (INDEPENDENT_AMBULATORY_CARE_PROVIDER_SITE_OTHER): Payer: Medicare Other | Admitting: Pulmonary Disease

## 2017-11-21 ENCOUNTER — Ambulatory Visit
Admission: RE | Admit: 2017-11-21 | Discharge: 2017-11-21 | Disposition: A | Discharge status: Home / Self Care | Payer: Medicare Other | Source: Ambulatory Visit | Attending: Pulmonary Disease | Admitting: Pulmonary Disease

## 2017-11-21 VITALS — BP 140/76 | HR 74 | Ht 63.5 in | Wt 137.6 lb

## 2017-11-21 DIAGNOSIS — J441 Chronic obstructive pulmonary disease with (acute) exacerbation: Secondary | ICD-10-CM | POA: Diagnosis not present

## 2017-11-21 DIAGNOSIS — R918 Other nonspecific abnormal finding of lung field: Secondary | ICD-10-CM | POA: Diagnosis not present

## 2017-11-21 DIAGNOSIS — Z9889 Other specified postprocedural states: Secondary | ICD-10-CM | POA: Diagnosis not present

## 2017-11-21 DIAGNOSIS — J310 Chronic rhinitis: Secondary | ICD-10-CM

## 2017-11-21 DIAGNOSIS — R0602 Shortness of breath: Secondary | ICD-10-CM | POA: Diagnosis not present

## 2017-11-21 MED ORDER — MONTELUKAST SODIUM 10 MG PO TABS: 10.0000 mg | ORAL_TABLET | Freq: Every day | ORAL | 3 refills | Status: DC

## 2017-11-21 MED ORDER — AZELASTINE-FLUTICASONE 137-50 MCG/ACT NA SUSP: 1.0000 | Freq: Two times a day (BID) | NASAL | 3 refills | Status: DC

## 2017-11-21 NOTE — Progress Notes (Signed)
Subjective:    Patient ID: Charles Webster, male    DOB: 28-Nov-1945, 72 y.o.   MRN: [id number]  HPI The patient is a 72 year old, former smoker, who follows usually with Dr. Belia Heman. He was seen by Dr. Belia Heman on 9 October. At that time he was noted to have decompensated COPD. He then presented on 17 October for an acute visit due to his symptoms not being better. He was switched to Spiriva Respimat from [facility] and was started on nocturnal oxygen at the visit with Dr. Belia Heman. He was also noted to have a mild exacerbation and was prescribed prednisone 20 mg for five days. The time we saw him on October 17 he had noted persistent to slightly worse symptoms. He has noted tenacious secretions. He has continued to uses nebulizer religiously with some improvement in his symptoms. He did feel some chills last night but has had no fevers or sweats. No chest pain. Cough is now productive of white sputum. He has had no hemoptysis. Patient notes that he has seasonal variation with his symptoms that that he usually has episodes like this once or twice a year. During his prior visit we gave him a taper of prednisone and he is now on his last 13 tablets. He also had an Azithromycin Z pack, he has completed this. His main complaint today is that he has persistent congestion. He does note severe postnasal drip that "chokes" him. He has been taking Mucinex only 600 mg twice a day with some mixed results.   Review of Systems  Constitutional: Negative.   HENT: Positive for congestion, postnasal drip and sinus pressure.   Respiratory: Positive for choking, chest tightness and shortness of breath.   Cardiovascular: Negative.   Gastrointestinal: Negative.   Skin: Negative.   All other systems reviewed and are negative.      Objective:   Physical Exam Constitutional: He is oriented to person, place, and time. He appears well-developed and well-nourished.  He is somewhat tachypneic.  HENT:  Head: Normocephalic  and atraumatic.  Mouth/Throat: No oropharyngeal exudate. He does have copious clear postnasal drip Nose: he has significant turbinates edema with clear mucoid discharge.  Eyes: Pupils are equal, round, and reactive to light. Conjunctivae and EOM are normal. No scleral icterus.  Neck: Neck supple. No JVD present. No tracheal deviation present. Cardiovascular: Normal rate, regular rhythm, normal heart sounds and intact distal pulses.  Pulmonary/Chest: No stridor. He has no wheezes. He has no rales. He exhibits no tenderness.  Dyspneic, coarse breath sounds throughout.  Abdominal: Soft. Bowel sounds are normal. He exhibits no distension.  Musculoskeletal: He exhibits no edema.  Lymphadenopathy:    He has no cervical adenopathy.  Neurological: He is alert and oriented to person, place, and time.  No focal deficits.  Skin: Skin is warm and dry. No rash noted. No erythema. No pallor.  Psychiatric: He has a normal mood and affect. Thought content normal.  Nursing note and vitals reviewed.       Assessment & Plan:   1) COPD with acute exacerbation: the patient appears to be improving in this regard however his remainder symptoms are related to severe chronic hypertrophic rhinitis with postnasal drip which gives him the sensation of "choking". He has been instructed to complete his prednisone taper. As he is still and prednisone we will defer flu vaccine today. He will be seen in follow-up by Dr. Belia Heman in 2 to 3 weeks. As to continue his COPD regimen otherwise.  Will be scheduled for pulmonary function testing prior to seeing Dr. Belia Heman.  2) Chronic hypertrophic rhinitis: the patient will be given a trial of montelukast one tablet daily and Dymista one spray to each nostril twice a day. He is to continue the use of Mucinex but was instructed to take up to 1200 mg of Mucinex twice a day and make sure he takes plenty of water during the day.   He will be seen in follow-up in 2 to 3 weeks time as noted  above, he is to contact the office prior to that time should any new difficulties arise or his symptoms worsen or fail to improve.

## 2017-11-21 NOTE — Patient Instructions (Signed)
1) you have a chest x-ray performed today.  2) will start Singulair (montelukast) one tablet daily.  3) Dymista one spray to each nostril twice a day  4) will be given samples of Mucinex take twice a day.  5) we will schedule you for breathing  tests.  6) will schedule you to follow with Dr. Belia Heman in 2 to 3 weeks

## 2017-11-23 ENCOUNTER — Other Ambulatory Visit: Payer: Self-pay | Admitting: Internal Medicine

## 2017-12-05 ENCOUNTER — Ambulatory Visit: Payer: Medicare Other | Attending: Pulmonary Disease

## 2017-12-05 DIAGNOSIS — J441 Chronic obstructive pulmonary disease with (acute) exacerbation: Secondary | ICD-10-CM | POA: Insufficient documentation

## 2017-12-05 MED ORDER — ALBUTEROL SULFATE (2.5 MG/3ML) 0.083% IN NEBU
2.5000 mg | INHALATION_SOLUTION | Freq: Once | RESPIRATORY_TRACT | Status: AC
  Administered 2017-12-05: 2.5 mg via RESPIRATORY_TRACT
  Filled 2017-12-05: qty 3

## 2017-12-13 ENCOUNTER — Ambulatory Visit (INDEPENDENT_AMBULATORY_CARE_PROVIDER_SITE_OTHER): Payer: Medicare Other | Admitting: Internal Medicine

## 2017-12-13 ENCOUNTER — Encounter: Payer: Self-pay | Admitting: Internal Medicine

## 2017-12-13 VITALS — BP 112/70 | HR 104 | Ht 63.5 in | Wt 136.6 lb

## 2017-12-13 DIAGNOSIS — J441 Chronic obstructive pulmonary disease with (acute) exacerbation: Secondary | ICD-10-CM

## 2017-12-13 MED ORDER — ARFORMOTEROL TARTRATE 15 MCG/2ML IN NEBU: 15.0000 ug | INHALATION_SOLUTION | Freq: Two times a day (BID) | RESPIRATORY_TRACT | Status: DC

## 2017-12-13 MED ORDER — PREDNISONE 20 MG PO TABS: 40.0000 mg | ORAL_TABLET | Freq: Every day | ORAL | 0 refills | Status: DC

## 2017-12-13 MED ORDER — IPRATROPIUM-ALBUTEROL 0.5-2.5 (3) MG/3ML IN SOLN: 3.0000 mL | RESPIRATORY_TRACT | 5 refills | Status: DC | PRN

## 2017-12-13 NOTE — Patient Instructions (Addendum)
BUDESONIDE NEBS TWICE DAILY  BROVANA NEBS TWICE DAILY  DOUNEBS EVERY 4 HRS AS NEEDED  PREDNISONE 40 mg daily for 7 days   STOP SPIRIVA and WIXELA   PATIENTS NEEDS MASK FOR NEB THERAPY

## 2017-12-13 NOTE — Progress Notes (Signed)
Name: Charles Webster MRN: [id number] DOB: 04/20/1945     CONSULTATION DATE: 5.9.19 REFERRING MD : Quentin Cornwall  CHIEF COMPLAINT: COPD  STUDIES:   4.7.18   CXR independently reviewed by Me  Increased lung volumes No pneumonia No effusions   Previous HISTORY OF PRESENT ILLNESS: 72 year old pleasant white Charles seen today for assessment for COPD Patient states he has been diagnosed with COPD for approximately 2 years ago however based on further questioning patient has chronic shortness of breath and progressive dyspnea exertion for the past 5 years  Patient has a history of tobacco abuse 1 pack a day for 40 years Patient worked at the [employer] and was exposed to paints 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 Based on his social history patient has a lot of advised mental exposures that would put him at risk for COPD  At this time patient states that he has progressive shortness of breath over the last several months in conjunction with allergic rhinitis Patient was diagnosed with bronchitis pneumonia 2 years ago was started on Advair and did well in approximately 1 year ago was admitted for COPD exacerbation and then was started on Spiriva  His inhaler therapy was working fine according to him but seem to have stopped working over the last several months Patient has chronic chest congestion and cough chronic cough with the productive sputum  No signs of infection at this time no active wheezing at this time no respiratory distress at this time  Patient states 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 at [facility]    CURRENT HPI Persistent shortness of breath Persistent dyspnea on exertion Positive cough  positive wheezing  Has been on several rounds of prednisone for COPD exacerbation Continues to have mild form of COPD And no acute distress at this time  Patient has failed all types of inhaler  therapy as well as albuterol neb therapy          Review of Systems:  Gen:  Denies  fever, sweats, chills weigh loss  HEENT: Denies blurred vision, double vision, ear pain, eye pain, hearing loss, nose bleeds, sore throat Cardiac:  No dizziness, chest pain or heaviness, chest tightness,edema, No JVD Resp:   +cough +sputum production, +shortness of breath,+wheezing, -hemoptysis,  Gi: Denies swallowing difficulty, stomach pain, nausea or vomiting, diarrhea, constipation, bowel incontinence Gu:  Denies bladder incontinence, burning urine Ext:   Denies Joint pain, stiffness or swelling Skin: Denies  skin rash, easy bruising or bleeding or hives Endoc:  Denies polyuria, polydipsia , polyphagia or weight change Psych:   Denies depression, insomnia or hallucinations  Other:  All other systems negative   ALL OTHER ROS ARE NEGATIVE  BP 112/70 (BP Location: Left Arm, Cuff Size: Normal)   Pulse (!) 104   Ht 5' 3.5" (1.613 m)   Wt 136 lb 9.6 oz (62 kg)   SpO2 97%   BMI 23.82 kg/m    Physical Examination:   GENERAL:NAD, no fevers, chills, no weakness no fatigue HEAD: Normocephalic, atraumatic.  EYES: Pupils equal, round, reactive to light. Extraocular muscles intact. No scleral icterus.  MOUTH: Moist mucosal membrane. Dentition intact. No abscess noted.  EAR, NOSE, THROAT: Clear without exudates. No external lesions.  NECK: Supple. No thyromegaly. No nodules. No JVD.  PULMONARY: CTA B/L no wheezing, rhonchi, crackles CARDIOVASCULAR: S1 and S2. Regular rate and rhythm. No murmurs, rubs, or gallops. No edema. Pedal  pulses 2+ bilaterally.  GASTROINTESTINAL: Soft, nontender, nondistended. No masses. Positive bowel sounds. No hepatosplenomegaly.  MUSCULOSKELETAL: No swelling, clubbing, or edema. Range of motion full in all extremities.  NEUROLOGIC: Cranial nerves II through XII are intact. No gross focal neurological deficits. Sensation intact. Reflexes intact.  SKIN: No ulceration,  lesions, rashes, or cyanosis. Skin warm and dry. Turgor intact.  PSYCHIATRIC: Mood, affect within normal limits. The patient is awake, alert and oriented x 3. Insight, judgment intact.  ALL OTHER ROS ARE NEGATIVE          ASSESSMENT / PLAN:  72 year old pleasant white Charles seen today seen today for follow-up assessment for COPD exacerbation he has debilitating chronic shortness of breath and dyspnea on exertion progressive over the last several years He is noncompliant with his oxygen therapy and he has a history of chronic hypoxic respiratory failure from his COPD  COPD exacerbation Patient has had several bouts of COPD exacerbation of the last several weeks He will need to stop his inhaler therapy and convert all of his medicines to nebulized form  At this time continue nebulized Pulmicort nebulized Brovana Also duo nebs every 4 hours as needed  He will also need prednisone 40 mg daily for 7 days   COPD Gold stage D severe Patient has respiratory insufficiency Cannot take inhalers anymore Needs nebulized therapy   Patient  satisfied with Plan of action and management. All questions answered Follow up in 3 months  Charles Webster Charles Webster, M.D.  [facility]  Medical Director [facility] Medical Director [facility] Cardio-Pulmonary Department

## 2018-01-04 ENCOUNTER — Other Ambulatory Visit: Payer: Self-pay | Admitting: Internal Medicine

## 2018-01-07 DIAGNOSIS — M9905 Segmental and somatic dysfunction of pelvic region: Secondary | ICD-10-CM | POA: Diagnosis not present

## 2018-01-07 DIAGNOSIS — M955 Acquired deformity of pelvis: Secondary | ICD-10-CM | POA: Diagnosis not present

## 2018-01-07 DIAGNOSIS — M5136 Other intervertebral disc degeneration, lumbar region: Secondary | ICD-10-CM | POA: Diagnosis not present

## 2018-01-07 DIAGNOSIS — M9903 Segmental and somatic dysfunction of lumbar region: Secondary | ICD-10-CM | POA: Diagnosis not present

## 2018-01-08 DIAGNOSIS — M9903 Segmental and somatic dysfunction of lumbar region: Secondary | ICD-10-CM | POA: Diagnosis not present

## 2018-01-08 DIAGNOSIS — M9905 Segmental and somatic dysfunction of pelvic region: Secondary | ICD-10-CM | POA: Diagnosis not present

## 2018-01-08 DIAGNOSIS — M955 Acquired deformity of pelvis: Secondary | ICD-10-CM | POA: Diagnosis not present

## 2018-01-08 DIAGNOSIS — M5136 Other intervertebral disc degeneration, lumbar region: Secondary | ICD-10-CM | POA: Diagnosis not present

## 2018-01-09 DIAGNOSIS — M955 Acquired deformity of pelvis: Secondary | ICD-10-CM | POA: Diagnosis not present

## 2018-01-09 DIAGNOSIS — M9905 Segmental and somatic dysfunction of pelvic region: Secondary | ICD-10-CM | POA: Diagnosis not present

## 2018-01-09 DIAGNOSIS — M5136 Other intervertebral disc degeneration, lumbar region: Secondary | ICD-10-CM | POA: Diagnosis not present

## 2018-01-09 DIAGNOSIS — M9903 Segmental and somatic dysfunction of lumbar region: Secondary | ICD-10-CM | POA: Diagnosis not present

## 2018-01-14 DIAGNOSIS — M9903 Segmental and somatic dysfunction of lumbar region: Secondary | ICD-10-CM | POA: Diagnosis not present

## 2018-01-14 DIAGNOSIS — M9905 Segmental and somatic dysfunction of pelvic region: Secondary | ICD-10-CM | POA: Diagnosis not present

## 2018-01-14 DIAGNOSIS — M955 Acquired deformity of pelvis: Secondary | ICD-10-CM | POA: Diagnosis not present

## 2018-01-14 DIAGNOSIS — M5136 Other intervertebral disc degeneration, lumbar region: Secondary | ICD-10-CM | POA: Diagnosis not present

## 2018-01-16 DIAGNOSIS — M5136 Other intervertebral disc degeneration, lumbar region: Secondary | ICD-10-CM | POA: Diagnosis not present

## 2018-01-16 DIAGNOSIS — M9903 Segmental and somatic dysfunction of lumbar region: Secondary | ICD-10-CM | POA: Diagnosis not present

## 2018-01-16 DIAGNOSIS — M955 Acquired deformity of pelvis: Secondary | ICD-10-CM | POA: Diagnosis not present

## 2018-01-16 DIAGNOSIS — M9905 Segmental and somatic dysfunction of pelvic region: Secondary | ICD-10-CM | POA: Diagnosis not present

## 2018-01-31 DIAGNOSIS — R252 Cramp and spasm: Secondary | ICD-10-CM | POA: Diagnosis not present

## 2018-01-31 DIAGNOSIS — R600 Localized edema: Secondary | ICD-10-CM | POA: Diagnosis not present

## 2018-01-31 DIAGNOSIS — J449 Chronic obstructive pulmonary disease, unspecified: Secondary | ICD-10-CM | POA: Diagnosis not present

## 2018-02-12 ENCOUNTER — Other Ambulatory Visit: Payer: Self-pay | Admitting: Internal Medicine

## 2018-02-13 DIAGNOSIS — J441 Chronic obstructive pulmonary disease with (acute) exacerbation: Secondary | ICD-10-CM | POA: Diagnosis not present

## 2018-04-28 ENCOUNTER — Telehealth: Payer: Self-pay | Admitting: Internal Medicine

## 2018-04-28 NOTE — Telephone Encounter (Signed)
Called and spoke to patient regarding [employer] and his oxygen use. Patient states he is now wearing the oxygen at night he just had to "wean himself on it." Notified patient that we would have to have OV to confirm with MD that he is using it for [employer]. Patient aware. Scheduled PHONE VISIT apt for [place] 04/30/18. Nothing further needed at this time.

## 2018-04-30 ENCOUNTER — Other Ambulatory Visit: Payer: Self-pay

## 2018-04-30 ENCOUNTER — Encounter: Payer: Self-pay | Admitting: Internal Medicine

## 2018-04-30 ENCOUNTER — Ambulatory Visit (INDEPENDENT_AMBULATORY_CARE_PROVIDER_SITE_OTHER): Payer: Medicare Other | Admitting: Internal Medicine

## 2018-04-30 DIAGNOSIS — J441 Chronic obstructive pulmonary disease with (acute) exacerbation: Secondary | ICD-10-CM | POA: Diagnosis not present

## 2018-04-30 DIAGNOSIS — J9611 Chronic respiratory failure with hypoxia: Secondary | ICD-10-CM | POA: Diagnosis not present

## 2018-04-30 DIAGNOSIS — J449 Chronic obstructive pulmonary disease, unspecified: Secondary | ICD-10-CM

## 2018-04-30 MED ORDER — PREDNISONE 20 MG PO TABS: 40.0000 mg | ORAL_TABLET | Freq: Every day | ORAL | 0 refills | Status: DC

## 2018-04-30 NOTE — Progress Notes (Signed)
Name: Charles Webster MRN: [id number] DOB: Sep 18, 1945     CONSULTATION DATE: 5.9.19 REFERRING MD : Quentin Cornwall  CHIEF COMPLAINT: COPD  STUDIES:   4.7.18   CXR independently reviewed by Me  Increased lung volumes No pneumonia No effusions     VIDEO/TELEPHONE VISIT    In the setting of the current Covid19 crisis, you are scheduled for a (video) visit 04/30/2018 Just as we do with many in-office visits, in order for you to participate in this visit, we must obtain consent.   I can obtain your verbal consent now.  PATIENT AGREES AND CONFIRMS -YES This Visit has Audio and Visual Capabilities for optimal patient care experience   Evaluation Performed:  Follow-up visit  This visit type was conducted due to national recommendations for restrictions regarding the COVID-19 Pandemic (e.g. social distancing).  This format is felt to be most appropriate for this patient at this time.  All issues noted in this document were discussed and addressed.  No physical exam was performed (except for noted visual exam findings with Telehealth visits).  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 Based on his social history patient has a lot of advised mental exposures that would put him at risk for COPD  At this time patient states that he has progressive shortness of breath over the last several months in conjunction with  allergic rhinitis Patient was diagnosed with bronchitis pneumonia 2 years ago was started on Advair and did well in approximately 1 year ago was admitted for COPD exacerbation and then was started on Spiriva  His inhaler therapy was working fine according to him but seem to have stopped working over the last several months Patient has chronic chest congestion and cough chronic cough with the productive sputum  No signs of infection at this time no active wheezing at this time no respiratory distress at this time  Patient states 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 albuterol (PROVENTIL) (2.5 MG/3ML) 0.083% nebulizer solution Take 3 mLs (2.5 mg total) by nebulization every 6 (six) hours as needed for wheezing or shortness of breath. J44.9  . Azelastine-Fluticasone (DYMISTA) 137-50 MCG/ACT SUSP Place 1 spray into the nose 2 (two) times daily.  . budesonide (PULMICORT) 0.5 MG/2ML nebulizer solution Take 2 mLs (0.5 mg total) by nebulization 2 (two) times daily. DX: J44.9  . cetirizine (ZYRTEC) 10 MG tablet Take 10 mg by mouth daily.  Marland Kitchen ipratropium-albuterol (DUONEB) 0.5-2.5 (3) MG/3ML SOLN Take 3 mLs by nebulization every 4 (four) hours as needed.  . montelukast (SINGULAIR) 10 MG tablet Take 1  tablet (10 mg total) by mouth daily. (Patient not taking: Reported on 12/13/2017)  . omeprazole (PRILOSEC) 40 MG capsule Take 40 mg by mouth daily.  . predniSONE (DELTASONE) 20 MG tablet Take 1 tablet (20 mg total) by mouth daily with breakfast. 5 days  . predniSONE (DELTASONE) 20 MG tablet Take 2 tablets (40 mg total) by mouth daily with breakfast. 4 days  . Spacer/Aero-Holding Chambers  (AEROCHAMBER PLUS) inhaler Use as instructed  . Tiotropium Bromide Monohydrate (SPIRIVA RESPIMAT) 1.25 MCG/ACT AERS Inhale 2 puffs into the lungs 2 (two) times daily.  Monte Fantasia INHUB 250-50 MCG/DOSE AEPB TAKE 1 PUFF BY MOUTH TWICE A DAY   Facility-Administered Encounter Medications as of 04/30/2018  Medication  . arformoterol (BROVANA) nebulizer solution 15 mcg        Review of Systems:  Gen:  Denies  fever, sweats, chills weigh loss  HEENT: Denies blurred vision, double vision, ear pain, eye pain, hearing loss, nose bleeds, sore throat Cardiac:  No dizziness, chest pain or heaviness, chest tightness,edema, No JVD Resp:   + cough, -sputum production, +shortness of breath,+wheezing, -hemoptysis,  Gi: Denies swallowing difficulty, stomach pain, nausea or vomiting, diarrhea, constipation, bowel incontinence Gu:  Denies bladder incontinence, burning urine Ext:   Denies Joint pain, stiffness or swelling Skin: Denies  skin rash, easy bruising or bleeding or hives Endoc:  Denies polyuria, polydipsia , polyphagia or weight change Psych:   Denies depression, insomnia or hallucinations  Other:  All other systems negative         ASSESSMENT / PLAN: 73 yo pleasant white male seen today for follow up assessment for COPD +COPD exacerbation at this time has debilitating CHRONIC SOB AND DOE Now with chronic hypoxic resp failure on oxygen therapy Patient uses and benefits from oxygen therapy 2L [place] at night     COPD exacerbation Patient has had several bouts of COPD exacerbation  Now with mild COPD exacerbation from pollen exposure Prednisone 40 mg daily for 10 days  COPD GOLD STAGE D severe resp insufficiency All therapy is nebulized at this time and seems to be helping Pulmicort nebs and Brovana Nebs ALb nebs as needed every 4 hrs Medications reviewed with patient   Chronic Resp failure with hypoxia from COPD Continue oxygen as prescribed Continues to use and benefit from  therapy He needs this for survival    COVID-19 Education: The signs and symptoms of COVID-19 were discussed with the patient and how to seek care for testing (follow up with PCP or arrange E-visit).  The importance of social distancing was discussed today.   Patient satisfied with Plan of action and management. All questions answered   Follow up in 3 months  Annick Dimaio Santiago Glad, M.D.  [facility]  Medical Director [facility] Medical Director [facility] Cardio-Pulmonary Department    TIME SPENT FOR THIS TELEVISIT 21 minutes

## 2018-04-30 NOTE — Patient Instructions (Signed)
Prednisone 40 mg daily for 10 days Avoid pollen    COVID-19 Education: The signs and symptoms of COVID-19 were discussed with the patient and how to seek care for testing (follow up with PCP or arrange E-visit).  The importance of social distancing was discussed today.   Continue oxygen as prescibed  Continue NEB therapy as prescribed

## 2018-05-29 ENCOUNTER — Ambulatory Visit (INDEPENDENT_AMBULATORY_CARE_PROVIDER_SITE_OTHER): Payer: Medicare Other | Admitting: Internal Medicine

## 2018-05-29 ENCOUNTER — Encounter: Payer: Self-pay | Admitting: Internal Medicine

## 2018-05-29 DIAGNOSIS — J449 Chronic obstructive pulmonary disease, unspecified: Secondary | ICD-10-CM | POA: Diagnosis not present

## 2018-05-29 MED ORDER — PREDNISONE 20 MG PO TABS: 10.0000 mg | ORAL_TABLET | Freq: Every day | ORAL | 3 refills | Status: DC

## 2018-05-29 NOTE — Progress Notes (Signed)
Name: Charles Webster MRN: [id number] DOB: 07-Jan-1946     CONSULTATION DATE: 5.9.19 REFERRING MD : Quentin Cornwall  CHIEF COMPLAINT: COPD  STUDIES:   4.7.18   CXR independently reviewed by Me  Increased lung volumes No pneumonia No effusions     VIDEO/TELEPHONE VISIT    In the setting of the current Covid19 crisis, you are scheduled for a (video) visit 05/29/2018 Just as we do with many in-office visits, in order for you to participate in this visit, we must obtain consent.   I can obtain your verbal consent now.  PATIENT AGREES AND CONFIRMS -YES This Visit has Audio and Visual Capabilities for optimal patient care experience   Evaluation Performed:  Follow-up visit  This visit type was conducted due to national recommendations for restrictions regarding the COVID-19 Pandemic (e.g. social distancing).  This format is felt to be most appropriate for this patient at this time.  All issues noted in this document were discussed and addressed.  No physical exam was performed (except for noted visual exam findings with Telehealth visits).  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 Based on his social history patient has a lot of advised mental exposures that would put him at risk for COPD  At this time patient states that he has progressive shortness of breath over the last several months in conjunction with  allergic rhinitis Patient was diagnosed with bronchitis pneumonia 2 years ago was started on Advair and did well in approximately 1 year ago was admitted for COPD exacerbation and then was started on Spiriva  His inhaler therapy was working fine according to him but seem to have stopped working over the last several months Patient has chronic chest congestion and cough chronic cough with the productive sputum  No signs of infection at this time no active wheezing at this time no respiratory distress at this time  Patient states 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 at [facility]    CURRENT HPI +SOB +[place] DOE  Doing OK Uses oxygen as night  +cough +wheezing +mild COPD exacerbation +pollen exposure   Patient has failed all types of inhaler therapy as well as albuterol neb therapy  Outpatient Encounter Medications as of 05/29/2018  Medication Sig  . albuterol (PROVENTIL) (2.5 MG/3ML) 0.083% nebulizer solution Take 3 mLs (2.5 mg total) by nebulization every 6 (six) hours as needed for wheezing or shortness of breath. J44.9  . Azelastine-Fluticasone (DYMISTA) 137-50 MCG/ACT SUSP Place 1 spray into the nose 2 (two) times daily.  . budesonide (PULMICORT) 0.5 MG/2ML nebulizer solution Take 2 mLs (0.5 mg total) by nebulization 2 (two) times daily. DX: J44.9  . cetirizine (ZYRTEC) 10 MG tablet Take 10 mg by mouth daily.  Marland Kitchen ipratropium-albuterol (DUONEB) 0.5-2.5 (3) MG/3ML SOLN Take 3 mLs by nebulization every 4 (four) hours as needed.  . montelukast (SINGULAIR) 10 MG tablet Take 1  tablet (10 mg total) by mouth daily. (Patient not taking: Reported on 12/13/2017)  . omeprazole (PRILOSEC) 40 MG capsule Take 40 mg by mouth daily.  . predniSONE (DELTASONE) 20 MG tablet Take 1 tablet (20 mg total) by mouth daily with breakfast. 5 days  . predniSONE (DELTASONE) 20 MG tablet Take 2 tablets (40 mg total) by mouth daily with breakfast. 4 days  . predniSONE (DELTASONE) 20  MG tablet Take 2 tablets (40 mg total) by mouth daily with breakfast. 10 days  . Spacer/Aero-Holding Chambers (AEROCHAMBER PLUS) inhaler Use as instructed  . Tiotropium Bromide Monohydrate (SPIRIVA RESPIMAT) 1.25 MCG/ACT AERS Inhale 2 puffs into the lungs 2 (two) times daily.  Monte Fantasia. WIXELA INHUB 250-50 MCG/DOSE AEPB TAKE 1 PUFF BY MOUTH TWICE A DAY   Facility-Administered Encounter Medications as of 05/29/2018  Medication  . 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 therapy Patient uses and benefits from oxygen therapy 2L [place] at night    COPD exacerbation Patient has had several bouts of COPD exacerbation  Now with mild COPD exacerbation from pollen exposure START PREDNISONE 10 MG DAILY   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  The importance of social distancing was discussed today.   Patient satisfied with Plan of action and management. All questions answered   Follow up in 3 months  Archita Lomeli Santiago Gladavid Mubashir Mallek, M.D.  [facility]  Medical Director [facility] Medical Director [facility] Cardio[facility] Department     TIME SPENT FOR THIS TELEVISIT 23 minutes

## 2018-05-29 NOTE — Patient Instructions (Signed)
Continue NEBS and oxygen as prescribed  Start Prednisone 10 g daily

## 2018-06-05 ENCOUNTER — Other Ambulatory Visit: Payer: Self-pay | Admitting: Internal Medicine

## 2018-06-27 ENCOUNTER — Other Ambulatory Visit: Payer: Self-pay | Admitting: Internal Medicine

## 2018-07-03 ENCOUNTER — Other Ambulatory Visit: Payer: Self-pay | Admitting: Internal Medicine

## 2018-07-03 MED ORDER — BUDESONIDE 0.5 MG/2ML IN SUSP: RESPIRATORY_TRACT | 5 refills | Status: DC

## 2018-07-03 NOTE — Telephone Encounter (Signed)
Received request for ICD code for [facility]ort from [facility]. [facility] with [facility] included.  Nothing further is needed.

## 2018-07-16 ENCOUNTER — Ambulatory Visit: Payer: Medicare Other | Admitting: Internal Medicine

## 2018-08-13 DIAGNOSIS — H2513 Age-related nuclear cataract, bilateral: Secondary | ICD-10-CM | POA: Diagnosis not present

## 2018-08-13 DIAGNOSIS — H353132 Nonexudative age-related macular degeneration, bilateral, intermediate dry stage: Secondary | ICD-10-CM | POA: Diagnosis not present

## 2018-08-13 DIAGNOSIS — H02889 Meibomian gland dysfunction of unspecified eye, unspecified eyelid: Secondary | ICD-10-CM | POA: Diagnosis not present

## 2018-08-14 ENCOUNTER — Telehealth: Payer: Self-pay | Admitting: Internal Medicine

## 2018-08-14 NOTE — Telephone Encounter (Signed)
Called and spoke to pt.  Pt states he is experiencing burning on tongue. Pt denied white spots or blisters.  Pt stated that he rinses his mouth after using inhaler.  Pt is questioning if spiriva can cause this.   DK please advise. Thanks

## 2018-08-15 NOTE — Telephone Encounter (Signed)
Pt is aware of below recommendations and voiced his understanding.  Nothing further is needed.  

## 2018-08-15 NOTE — Telephone Encounter (Signed)
Not usually need to rinse mouth Need to see PCP if worsens

## 2018-08-28 ENCOUNTER — Encounter: Payer: Self-pay | Admitting: Internal Medicine

## 2018-08-28 ENCOUNTER — Ambulatory Visit (INDEPENDENT_AMBULATORY_CARE_PROVIDER_SITE_OTHER): Payer: Medicare Other | Admitting: Internal Medicine

## 2018-08-28 DIAGNOSIS — K146 Glossodynia: Secondary | ICD-10-CM

## 2018-08-28 DIAGNOSIS — J441 Chronic obstructive pulmonary disease with (acute) exacerbation: Secondary | ICD-10-CM | POA: Diagnosis not present

## 2018-08-28 MED ORDER — AZITHROMYCIN 250 MG PO TABS: ORAL_TABLET | ORAL | 0 refills | Status: DC

## 2018-08-28 MED ORDER — IPRATROPIUM-ALBUTEROL 0.5-2.5 (3) MG/3ML IN SOLN: 3.0000 mL | RESPIRATORY_TRACT | 5 refills | Status: DC | PRN

## 2018-08-28 MED ORDER — BUDESONIDE 0.5 MG/2ML IN SUSP: RESPIRATORY_TRACT | 5 refills | Status: DC

## 2018-08-28 NOTE — Progress Notes (Signed)
Name: Charles Webster MRN: [id number] DOB: 08/02/1945     CONSULTATION DATE: 5.9.19 REFERRING MD : Quentin CornwallSanani    I connected with the patient by video/telephone enabled telemedicine visit and verified that I am speaking with the correct person using two identifiers.    I discussed the limitations, risks, security and privacy concerns of performing an evaluation and management service by telemedicine and the availability of in-person appointments. I also discussed with the patient that there may be a patient responsible charge related to this service. The patient expressed understanding and agreed to proceed.  PATIENT AGREES AND CONFIRMS -YES   Other persons participating in the visit and their role in the encounter: Patient, nursing   Patient's location: Home Provider's location: Clinic   I discussed the limitations, risks, security and privacy concerns of performing an evaluation and management service by telephone and the availability of in person appointments. I also discussed with the patient that there may be a patient responsible charge related to this service. The patient expressed understanding and agreed to proceed.  This visit type was conducted due to national recommendations for restrictions regarding the COVID-19 Pandemic (e.g. social distancing).  This format is felt to be most appropriate for this patient at this time.  All issues noted in this document were discussed and addressed.       CHIEF COMPLAINT: follow up COPD  STUDIES:   4.7.18   CXR independently reviewed by Me  Increased lung volumes No pneumonia No effusions        Previous HISTORY OF PRESENT ILLNESS: 73 year old pleasant white male seen today for assessment for COPD Patient states he has been diagnosed with COPD for approximately 2 years ago however based on further questioning patient has chronic shortness of breath and progressive dyspnea exertion for the past 5 years  Patient has a history of  tobacco abuse 1 pack a day for 40 years Patient worked at the [employer] and was exposed to paints 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 Based on his social history patient has a lot of advised mental exposures that would put him at risk for COPD  At this time patient states that he has progressive shortness of breath over the last several months in conjunction with allergic rhinitis Patient was diagnosed with bronchitis pneumonia 2 years ago was started on Advair and did well in approximately 1 year ago was admitted for COPD exacerbation and then was started on Spiriva  His inhaler therapy was working fine according to him but seem to have stopped working over the last several months Patient has chronic chest congestion and cough chronic cough with the productive sputum  No signs of infection at this time no active wheezing at this time no respiratory distress at this time  Patient states 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    CURRENT HPI +Chronic SOB, DOE Uses oxygen at night  Uses oxygen at night  +cough +wheezing Exposed to pollen, grass  No fevers, no chills  Bumps on tongue burning sensation   Patient has failed all types of inhaler therapy as well as albuterol neb therapy  Outpatient Encounter Medications as of 08/28/2018  Medication Sig  . albuterol (PROVENTIL) (2.5 MG/3ML) 0.083% nebulizer solution TAKE 3ML (ONE VIAL) BY NEBULIZATION EVERY 6 HOURS AS NEEDED FOR WHEEZING/SHORTNESS OF BREATH. J44.9  . Azelastine-Fluticasone (DYMISTA) 137-50 MCG/ACT SUSP Place 1 spray into the  nose 2 (two) times daily.  . budesonide (PULMICORT) 0.5 MG/2ML nebulizer solution TAKE 2 MLS (0.5 MG TOTAL) BY NEBULIZATION 2 (TWO) TIMES DAILY.  . cetirizine (ZYRTEC) 10 MG tablet Take 10 mg by mouth daily.  Marland Kitchen ipratropium-albuterol (DUONEB) 0.5-2.5 (3) MG/3ML SOLN Take 3 mLs by nebulization every 4  (four) hours as needed.  . montelukast (SINGULAIR) 10 MG tablet Take 1 tablet (10 mg total) by mouth daily. (Patient not taking: Reported on 12/13/2017)  . omeprazole (PRILOSEC) 40 MG capsule Take 40 mg by mouth daily.  . predniSONE (DELTASONE) 20 MG tablet Take 1 tablet (20 mg total) by mouth daily with breakfast. 5 days  . predniSONE (DELTASONE) 20 MG tablet Take 2 tablets (40 mg total) by mouth daily with breakfast. 4 days  . predniSONE (DELTASONE) 20 MG tablet Take 2 tablets (40 mg total) by mouth daily with breakfast. 10 days  . predniSONE (DELTASONE) 20 MG tablet Take 0.5 tablets (10 mg total) by mouth daily with breakfast. 10 days  . Spacer/Aero-Holding Chambers (AEROCHAMBER PLUS) inhaler Use as instructed  . Tiotropium Bromide Monohydrate (SPIRIVA RESPIMAT) 1.25 MCG/ACT AERS Inhale 2 puffs into the lungs 2 (two) times daily.  Grant Ruts INHUB 250-50 MCG/DOSE AEPB TAKE 1 PUFF BY MOUTH TWICE A DAY   Facility-Administered Encounter Medications as of 08/28/2018  Medication  . arformoterol (BROVANA) nebulizer solution 15 mcg     Review of Systems:  Gen:  Denies  fever, sweats, chills weight loss  HEENT: Denies blurred vision, double vision, ear pain, eye pain, hearing loss, nose bleeds, sore throat Cardiac:  No dizziness, chest pain or heaviness, chest tightness,edema, No JVD Resp:  + cough, -sputum production, +shortness of breath,+wheezing, -hemoptysis,  Gi: Denies swallowing difficulty, stomach pain, nausea or vomiting, diarrhea, constipation, bowel incontinence Gu:  Denies bladder incontinence, burning urine Ext:   Denies Joint pain, stiffness or swelling Skin: Denies  skin rash, easy bruising or bleeding or hives Endoc:  Denies polyuria, polydipsia , polyphagia or weight change Psych:   Denies depression, insomnia or hallucinations  Other:  All other systems negative         ASSESSMENT / PLAN:  73 yo pleasant white male with GOLD stage D COPD  recurrent bouts of COPD  exacerbation with chronic hypoxic resp failure  +MILD COPD exacerbation Continue prednisone as prescribed from before Patient has had several bouts of COPD exacerbation PREDNISONE 10 MG DAILY   COPD GOLD STAGE D Severe resp insufficiency All therapy is nebulized at this time Continue pulmicort NEBS ALb nebs as needed every 4 hrs Medications reviewed with patient Z pak  Chronic hypoxic respiratory failure from COPD Continue oxygen as prescribed Patient uses and benefits from oxygen therapy He needs this for survival   Tongue burning Recommend ENT consultation but patient will see his PCP first  COVID-19 EDUCATION: The signs and symptoms of COVID-19 were discussed with the patient and how to seek care for testing.  The importance of social distancing was discussed today. Hand Washing Techniques and avoid touching face was advised.  MEDICATION ADJUSTMENTS/LABS AND TESTS ORDERED: Prednisone 20 mg daily as needed   CURRENT MEDICATIONS REVIEWED AT LENGTH WITH PATIENT TODAY   Patient satisfied with Plan of action and management. All questions answered  Follow up in 6 months  Total Time Spent 23 mins  Maretta Bees Patricia Pesa, M.D.  [facility]  Medical Director [place] Director [facility] Cardio-Pulmonary Department

## 2018-08-28 NOTE — Patient Instructions (Addendum)
Continue prednisone as prescribed Continue nebulized therapy as prescribed Z-Pak ordered and prescribed   Continue oxygen as prescribed  Recommend ENT evaluation for tongue burning but  Patient to follow-up with PCP

## 2018-09-08 DIAGNOSIS — K219 Gastro-esophageal reflux disease without esophagitis: Secondary | ICD-10-CM | POA: Diagnosis not present

## 2018-09-08 DIAGNOSIS — R208 Other disturbances of skin sensation: Secondary | ICD-10-CM | POA: Diagnosis not present

## 2018-09-26 DIAGNOSIS — H353132 Nonexudative age-related macular degeneration, bilateral, intermediate dry stage: Secondary | ICD-10-CM | POA: Diagnosis not present

## 2018-09-26 DIAGNOSIS — H538 Other visual disturbances: Secondary | ICD-10-CM | POA: Diagnosis not present

## 2018-10-14 ENCOUNTER — Telehealth: Payer: Self-pay | Admitting: Internal Medicine

## 2018-10-14 DIAGNOSIS — J441 Chronic obstructive pulmonary disease with (acute) exacerbation: Secondary | ICD-10-CM

## 2018-10-14 MED ORDER — IPRATROPIUM-ALBUTEROL 0.5-2.5 (3) MG/3ML IN SOLN: 3.0000 mL | RESPIRATORY_TRACT | 5 refills | Status: DC | PRN

## 2018-10-14 NOTE — Telephone Encounter (Signed)
Rx for duoneb has been sent to preferred pharmacy.  Pt is aware and voiced his understanding.  Nothing further is needed.

## 2018-10-22 ENCOUNTER — Other Ambulatory Visit: Payer: Self-pay

## 2018-10-22 ENCOUNTER — Encounter: Payer: Self-pay | Admitting: Internal Medicine

## 2018-10-22 ENCOUNTER — Ambulatory Visit (INDEPENDENT_AMBULATORY_CARE_PROVIDER_SITE_OTHER): Payer: Medicare Other | Admitting: Internal Medicine

## 2018-10-22 VITALS — BP 122/88 | HR 74 | Temp 97.6°F | Ht 63.5 in | Wt 133.0 lb

## 2018-10-22 DIAGNOSIS — J449 Chronic obstructive pulmonary disease, unspecified: Secondary | ICD-10-CM | POA: Diagnosis not present

## 2018-10-22 DIAGNOSIS — B370 Candidal stomatitis: Secondary | ICD-10-CM | POA: Diagnosis not present

## 2018-10-22 MED ORDER — ALBUTEROL SULFATE HFA 108 (90 BASE) MCG/ACT IN AERS: 2.0000 | INHALATION_SPRAY | Freq: Four times a day (QID) | RESPIRATORY_TRACT | 3 refills | Status: DC | PRN

## 2018-10-22 MED ORDER — FLUCONAZOLE 100 MG PO TABS: 100.0000 mg | ORAL_TABLET | Freq: Every day | ORAL | 1 refills | Status: DC

## 2018-10-22 NOTE — Patient Instructions (Addendum)
STOP ALBUTEROL NEBS   DOUNEBS  EVERY 4 HRS as needed  PULMICORT NEBS TWICE DAILY  Continue oxygen at night  ALBUTEROL HFA INHALER AS NEEDED  DIFLUCAN 100 mg daily for 3 days

## 2018-10-22 NOTE — Progress Notes (Signed)
Name: Charles Webster MRN: [id number] DOB: 12/10/1945     CONSULTATION DATE: 5.9.19 REFERRING MD : Quentin Cornwall  CHIEF COMPLAINT: follow up COPD  STUDIES:   4.7.18   CXR independently reviewed by Me  Increased lung volumes No pneumonia No effusions  Previous HISTORY OF PRESENT ILLNESS: 73 year old pleasant white male seen today for assessment for COPD Patient states he has been diagnosed with COPD for approximately 2 years ago however based on further questioning patient has chronic shortness of breath and progressive dyspnea exertion for the past 5 years  Patient has a history of tobacco abuse 1 pack a day for 40 years Patient worked at the [employer] and was exposed to paints 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 Based on his social history patient has a lot of advised mental exposures that would put him at risk for COPD  At this time patient states that he has progressive shortness of breath over the last several months in conjunction with allergic rhinitis Patient was diagnosed with bronchitis pneumonia 2 years ago was started on Advair and did well in approximately 1 year ago was admitted for COPD exacerbation and then was started on Spiriva  His inhaler therapy was working fine according to him but seem to have stopped working over the last several months Patient has chronic chest congestion and cough chronic cough with the productive sputum  No signs of infection at this time no active wheezing at this time no respiratory distress at this time  Patient states 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 at [facility]    CURRENT HPI Progressive chronic shortness of breath and dyspnea on exertion Use oxygen at nighttime  Patient using Pulmicort twice daily DuoNebs every 4 as needed This regimen seems to help him the best  Patient has stopped taking traditional inhaler therapy due to poor  respiratory insufficiency Patient does have signs symptoms of oral thrush Nystatin has not helped    Patient has failed all types of inhaler therapy as well as albuterol neb therapy  Outpatient Encounter Medications as of 10/22/2018  Medication Sig  . albuterol (PROVENTIL) (2.5 MG/3ML) 0.083% nebulizer solution TAKE (ONE VIAL) BY NEBULIZATION EVERY 6 HOURS AS NEEDED FOR WHEEZING/SHORTNESS OF BREATH. J44.9  . budesonide (PULMICORT) 0.5 MG/2ML nebulizer solution TAKE 2 MLS (0.5 MG TOTAL) BY NEBULIZATION 2 (TWO) TIMES DAILY.  . cetirizine (ZYRTEC) 10 MG tablet Take 10 mg by mouth daily.  Marland Kitchen ipratropium-albuterol (DUONEB) 0.5-2.5 (3) MG/3ML SOLN Take 3 mLs by nebulization every 4 (four) hours as needed. Dx:J44.9  . omeprazole (PRILOSEC) 40 MG capsule Take 40 mg by mouth daily.  . [DISCONTINUED] azithromycin (ZITHROMAX Z-PAK) 250 MG tablet Take 2 tablets on Day 1 and then 1 tablet daily till gone.  . [DISCONTINUED] predniSONE (DELTASONE) 20 MG tablet Take 1 tablet (20 mg total) by mouth daily with breakfast. 5 days  . [DISCONTINUED] predniSONE (DELTASONE) 20 MG tablet Take 2 tablets (40 mg total) by mouth daily with breakfast. 4 days  . [DISCONTINUED] predniSONE (DELTASONE) 20 MG tablet Take 2 tablets (40 mg total) by mouth daily with breakfast. 10 days  . [DISCONTINUED] predniSONE (DELTASONE) 20 MG tablet Take 0.5 tablets (10 mg total) by mouth daily with breakfast. 10 days  . [DISCONTINUED] Tiotropium Bromide Monohydrate (SPIRIVA RESPIMAT) 1.25 MCG/ACT AERS Inhale 2 puffs into the lungs 2 (two) times daily.  . [DISCONTINUED] WIXELA INHUB 250-50 MCG/DOSE AEPB TAKE  1 PUFF BY MOUTH TWICE A DAY  . Azelastine-Fluticasone (DYMISTA) 137-50 MCG/ACT SUSP Place 1 spray into the nose 2 (two) times daily.  . montelukast (SINGULAIR) 10 MG tablet Take 1 tablet (10 mg total) by mouth daily. (Patient not taking: Reported on 12/13/2017)  . Spacer/Aero-Holding Chambers (AEROCHAMBER PLUS) inhaler Use as  instructed (Patient not taking: Reported on 10/22/2018)   Facility-Administered Encounter Medications as of 10/22/2018  Medication  . arformoterol (BROVANA) nebulizer solution 15 mcg     Review of Systems:  Gen:  Denies  fever, sweats, chills weight loss  HEENT: Denies blurred vision, double vision, ear pain, eye pain, hearing loss, nose bleeds, sore throat Cardiac:  No dizziness, chest pain or heaviness, chest tightness,edema, No JVD Resp:   No cough, -sputum production, -shortness of breath,-wheezing, -hemoptysis,  Gi: Denies swallowing difficulty, stomach pain, nausea or vomiting, diarrhea, constipation, bowel incontinence Gu:  Denies bladder incontinence, burning urine Ext:   Denies Joint pain, stiffness or swelling Skin: Denies  skin rash, easy bruising or bleeding or hives Endoc:  Denies polyuria, polydipsia , polyphagia or weight change Psych:   Denies depression, insomnia or hallucinations  Other:  All other systems negative  BP 122/88 (BP Location: Left Arm, Cuff Size: Normal)   Pulse 74   Temp 97.6 F (36.4 C) (Temporal)   Ht 5' 3.5" (1.613 m)   Wt 133 lb (60.3 kg)   SpO2 92%   BMI 23.19 kg/m      Physical Examination:   GENERAL:NAD, no fevers, chills, no weakness no fatigue HEAD: Normocephalic, atraumatic.  NECK: Supple. No thyromegaly.  No JVD.  PULMONARY: CTA B/L no wheezing, rhonchi, crackles CARDIOVASCULAR: S1 and S2. Regular rate and rhythm. No murmurs GASTROINTESTINAL: Soft, nontender, nondistended. Positive bowel sounds.  MUSCULOSKELETAL: No swelling, clubbing, or edema.  NEUROLOGIC: No gross focal neurological deficits. 5/5 strength all extremities SKIN: No ulceration, lesions, rashes, or cyanosis.  PSYCHIATRIC: Insight, judgment intact. -depression -anxiety ALL OTHER ROS ARE NEGATIVE          ASSESSMENT / PLAN:  73 year old pleasant white male with gold stage D COPD End-stage COPD with chronic hypoxic respiratory failure  Gold stage D COPD  Continue Pulmicort and duo nebs as prescribed All therapy is nebulized at this time  Chronic hypoxic respiratory failure from COPD Continue oxygen as prescribed Patient uses and benefits from oxygen therapy He needs this for survival  Oral thrush Start Diflucan therapy    COVID-19 EDUCATION: The signs and symptoms of COVID-19 were discussed with the patient and how to seek care for testing.  The importance of social distancing was discussed today. Hand Washing Techniques and avoid touching face was advised.  MEDICATION ADJUSTMENTS/LABS AND TESTS ORDERED: STOP ALBUTEROL NEBS   DOUNEBS  EVERY 4 HRS as needed  PULMICORT NEBS TWICE DAILY  Continue oxygen at night  ALBUTEROL HFA INHALER AS NEEDED  DIFLUCAN 100 mg daily for 3 days  CURRENT MEDICATIONS REVIEWED AT LENGTH WITH PATIENT TODAY   Patient satisfied with Plan of action and management. All questions answered  Follow up in 6 months   Solange Emry Patricia Pesa, M.D.  [facility]  Medical Director [place] Director [facility] Cardio-Pulmonary Department

## 2018-11-27 DIAGNOSIS — Z23 Encounter for immunization: Secondary | ICD-10-CM | POA: Diagnosis not present

## 2018-12-15 ENCOUNTER — Telehealth: Payer: Self-pay | Admitting: Internal Medicine

## 2018-12-15 MED ORDER — FLUCONAZOLE 100 MG PO TABS: 100.0000 mg | ORAL_TABLET | Freq: Every day | ORAL | 0 refills | Status: AC

## 2018-12-15 NOTE — Telephone Encounter (Signed)
DK please advise. Thanks 

## 2018-12-15 NOTE — Telephone Encounter (Signed)
Rx for Diflucan 100 #3 has been sent to preferred pharmacy, as previously prescribed by Dr. Mortimer Fries.  Pt is aware and voiced his understanding.  Nothing further is needed.

## 2018-12-15 NOTE — Telephone Encounter (Signed)
If prescribed before, please order the same dosage

## 2019-01-17 ENCOUNTER — Other Ambulatory Visit: Payer: Self-pay | Admitting: Internal Medicine

## 2019-01-17 DIAGNOSIS — J449 Chronic obstructive pulmonary disease, unspecified: Secondary | ICD-10-CM

## 2019-02-26 ENCOUNTER — Other Ambulatory Visit: Payer: Self-pay | Admitting: Internal Medicine

## 2019-02-26 DIAGNOSIS — J441 Chronic obstructive pulmonary disease with (acute) exacerbation: Secondary | ICD-10-CM

## 2019-03-09 ENCOUNTER — Other Ambulatory Visit: Payer: Self-pay | Admitting: Internal Medicine

## 2019-03-09 DIAGNOSIS — J449 Chronic obstructive pulmonary disease, unspecified: Secondary | ICD-10-CM

## 2019-03-10 ENCOUNTER — Telehealth: Payer: Self-pay | Admitting: Internal Medicine

## 2019-03-10 NOTE — Telephone Encounter (Signed)
I called the pharmacy and spoke with Florentina Addison to see what the issue was. She stated that when the rx was sent over on the 28th of January it was to early to fill. She ran the information while on the phone with me and it went through. She stated they will go ahead and get the rx ready for the patient.  I called and LVM to inform pt.

## 2019-03-11 NOTE — Telephone Encounter (Signed)
Followed up with pt to see if he received my message from yesterday and pt stated he did and has picked up his medication. Nothing further needed.

## 2019-04-19 ENCOUNTER — Other Ambulatory Visit: Payer: Self-pay | Admitting: Internal Medicine

## 2019-04-19 DIAGNOSIS — J441 Chronic obstructive pulmonary disease with (acute) exacerbation: Secondary | ICD-10-CM

## 2019-05-04 ENCOUNTER — Telehealth: Payer: Self-pay | Admitting: Internal Medicine

## 2019-05-04 MED ORDER — AZITHROMYCIN 250 MG PO TABS: 250.0000 mg | ORAL_TABLET | ORAL | 0 refills | Status: DC

## 2019-05-04 MED ORDER — PREDNISONE 20 MG PO TABS: 20.0000 mg | ORAL_TABLET | Freq: Every day | ORAL | 0 refills | Status: DC

## 2019-05-04 NOTE — Telephone Encounter (Signed)
Z pak Pred 20 daily for 10 days

## 2019-05-04 NOTE — Telephone Encounter (Signed)
Spoke with the pt  He is c/o increased SOB, cough with clear sputum and wheezing x 4-5 days  He states no f/c/s, sore throat, body aches, sick contacts  He is still on pulmicort bid and has been using duonebs every 4 hours  Pt requesting pred taper Please advise, thanks!

## 2019-05-04 NOTE — Telephone Encounter (Signed)
Spoke with the pt and notified of recs per Dr Belia Heman and he verbalized understanding, rxs sent to pharm.

## 2019-05-20 ENCOUNTER — Telehealth: Payer: Self-pay | Admitting: Internal Medicine

## 2019-05-20 NOTE — Telephone Encounter (Signed)
Dr. Belia Heman please advise on message from patient:  pt requested call back anytime this afternoon in regards to breathing problems. Prescribed meds from 15 days ago were taken, now they're gone and breathing is worsening. Was told to call back if symptoms persited.

## 2019-05-21 ENCOUNTER — Ambulatory Visit (INDEPENDENT_AMBULATORY_CARE_PROVIDER_SITE_OTHER): Payer: Medicare Other | Admitting: Internal Medicine

## 2019-05-21 ENCOUNTER — Other Ambulatory Visit: Payer: Self-pay | Admitting: Internal Medicine

## 2019-05-21 ENCOUNTER — Encounter: Payer: Self-pay | Admitting: Internal Medicine

## 2019-05-21 DIAGNOSIS — J9611 Chronic respiratory failure with hypoxia: Secondary | ICD-10-CM | POA: Diagnosis not present

## 2019-05-21 DIAGNOSIS — J441 Chronic obstructive pulmonary disease with (acute) exacerbation: Secondary | ICD-10-CM

## 2019-05-21 MED ORDER — AZITHROMYCIN 250 MG PO TABS: ORAL_TABLET | ORAL | 5 refills | Status: DC

## 2019-05-21 MED ORDER — PREDNISONE 20 MG PO TABS: 40.0000 mg | ORAL_TABLET | Freq: Every day | ORAL | 5 refills | Status: DC

## 2019-05-21 MED ORDER — BUDESONIDE 0.5 MG/2ML IN SUSP: RESPIRATORY_TRACT | 5 refills | Status: DC

## 2019-05-21 MED ORDER — IPRATROPIUM-ALBUTEROL 0.5-2.5 (3) MG/3ML IN SOLN: 3.0000 mL | RESPIRATORY_TRACT | 10 refills | Status: DC | PRN

## 2019-05-21 NOTE — Telephone Encounter (Signed)
Lets plan for TELEVISIT today 4/22 if possible

## 2019-05-21 NOTE — Patient Instructions (Signed)
Continue nebulizers as prescribed  Duo nebs as needed  Pulmicort twice daily  Prednisone 40 mg daily for 10 days  Continue oxygen as prescribed

## 2019-05-21 NOTE — Telephone Encounter (Signed)
Called and scheduled patient for Televisit with Dr. Belia Heman at 10:30. Nothing further needed at this time.

## 2019-05-21 NOTE — Progress Notes (Signed)
Name: Charles Webster MRN: [id number] DOB: [birth year]     CONSULTATION DATE: 5.9.19 REFERRING MD : Tor Netters  STUDIES:   4.7.18   CXR independently reviewed by Me  Increased lung volumes No pneumonia No effusions  Previous HISTORY OF PRESENT ILLNESS: 74 year old pleasant white male seen today for assessment for COPD Patient states he has been diagnosed with COPD for approximately 2 years ago however based on further questioning patient has chronic shortness of breath and progressive dyspnea exertion for the past 5 years  Patient has a history of tobacco abuse 1 pack a day for 40 years Patient worked at the [employer] and was exposed to paints 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 Based on his social history patient has a lot of advised mental exposures that would put him at risk for COPD  At this time patient states that he has progressive shortness of breath over the last several months in conjunction with allergic rhinitis Patient was diagnosed with bronchitis pneumonia 2 years ago was started on Advair and did well in approximately 1 year ago was admitted for COPD exacerbation and then was started on Spiriva  His inhaler therapy was working fine according to him but seem to have stopped working over the last several months Patient has chronic chest congestion and cough chronic cough with the productive sputum  No signs of infection at this time no active wheezing at this time no respiratory distress at this time  Patient states 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 at [facility]  CC +COPD exacerbation   HPI Patient with progressive shortness of breath and dyspnea exertion Patient with increased wheezing and coughing At this time patient has active signs of COPD exacerbation Patient needs prednisone and antibiotics  Patient uses Pulmicort twice daily Duo nebs every 4 hours This regimen seems  to be helping at this time however has an active COPD exacerbation  Patient has stopped taking traditional inhaler therapy due to poor respiratory insufficiency  +COPD exacerbation at this time No evidence of heart failure at this time +Respiratory distress No fevers, chills, nausea, vomiting, diarrhea No evidence of lower extremity edema No evidence hemoptysis    Patient has failed all types of inhaler therapy as well as albuterol neb therapy  Outpatient Encounter Medications as of 05/21/2019  Medication Sig  . albuterol (VENTOLIN HFA) 108 (90 Base) MCG/ACT inhaler Inhale 2 puffs into the lungs every 6 (six) hours as needed for wheezing or shortness of breath.  . Azelastine-Fluticasone (DYMISTA) 137-50 MCG/ACT SUSP Place 1 spray into the nose 2 (two) times daily.  Marland Kitchen azithromycin (ZITHROMAX) 250 MG tablet Take 1 tablet (250 mg total) by mouth as directed.  . budesonide (PULMICORT) 0.5 MG/2ML nebulizer solution TAKE 2 MLS (0.5 MG TOTAL) BY NEBULIZATION 2 (TWO) TIMES DAILY.  . cetirizine (ZYRTEC) 10 MG tablet Take 10 mg by mouth daily.  . fluconazole (DIFLUCAN) 100 MG tablet Take 1 tablet (100 mg total) by mouth daily.  Marland Kitchen ipratropium-albuterol (DUONEB) 0.5-2.5 (3) MG/3ML SOLN TAKE 3 MLS BY NEBULIZATION EVERY 4 (FOUR) HOURS AS NEEDED. DX:J44.9  . montelukast (SINGULAIR) 10 MG tablet Take 1 tablet (10 mg total) by mouth daily. (Patient not taking: Reported on 12/13/2017)  . omeprazole (PRILOSEC) 40 MG capsule Take 40 mg by mouth daily.  . predniSONE (DELTASONE) 20 MG tablet Take 1 tablet (20 mg total) by mouth daily with breakfast.  . Spacer/Aero-Holding Josiah Lobo (  AEROCHAMBER PLUS) inhaler Use as instructed (Patient not taking: Reported on 10/22/2018)   Facility-Administered Encounter Medications as of 05/21/2019  Medication  . arformoterol (BROVANA) nebulizer solution 15 mcg     Review of Systems:  Gen:  Denies  fever, sweats, chills weight loss  HEENT: Denies blurred vision, double  vision, ear pain, eye pain, hearing loss, nose bleeds, sore throat Cardiac:  No dizziness, chest pain or heaviness, chest tightness,edema, No JVD Resp:   +cough, +sputum production, +shortness of breath,+wheezing, -hemoptysis,  Gi: Denies swallowing difficulty, stomach pain, nausea or vomiting, diarrhea, constipation, bowel incontinence Gu:  Denies bladder incontinence, burning urine Ext:   Denies Joint pain, stiffness or swelling Skin: Denies  skin rash, easy bruising or bleeding or hives Endoc:  Denies polyuria, polydipsia , polyphagia or weight change Psych:   Denies depression, insomnia or hallucinations  Other:  All other systems negative   There were no vitals taken for this visit.     ASSESSMENT / PLAN:  74 year old pleasant white male with end-stage COPD Gold stage D Acute televisit for severe COPD exacerbation Patient with active wheezing shortness of breath increased work of breathing Patient has diagnosis of COPD exacerbation at this time  COPD exacerbation Prednisone 40 mg daily for 10 days Z-Pak prescribed We will need to discuss chronic prednisone therapy or azithromycin therapy for further prevention of COPD exacerbations at next office visit Patient advised to go to the ER if symptoms get worse  Gold stage D COPD At this time patient is intolerant of traditional inhaler therapy due to respiratory insufficiency Patient needs nebulized therapy as maintenance therapy Continue Pulmicort twice daily continue duo nebs as needed All therapy needs to be nebulized at this time  Chronic hypoxic respiratory failure from COPD Continue oxygen as prescribed Patient uses and benefits from oxygen therapy He needs this for survival    COVID-19 EDUCATION: The signs and symptoms of COVID-19 were discussed with the patient and how to seek care for testing.  The importance of social distancing was discussed today. Hand Washing Techniques and avoid touching face was advised.   MEDICATION ADJUSTMENTS/LABS AND TESTS ORDERED: Continue nebulizers as prescribed  Duo nebs as needed  Pulmicort twice daily  Prednisone 40 mg daily for 10 days  Continue oxygen as prescribed    CURRENT MEDICATIONS REVIEWED AT LENGTH WITH PATIENT TODAY   Patient satisfied with Plan of action and management. All questions answered  Follow up in 6 months TOTAL TIME SPENT 31 mins  Jaden Batchelder Santiago Glad, M.D.  [facility]  Medical Director [facility] Medical Director [facility] Cardio-Pulmonary Department

## 2019-06-15 ENCOUNTER — Telehealth: Payer: Self-pay | Admitting: Internal Medicine

## 2019-06-15 NOTE — Telephone Encounter (Signed)
Spoke with patient. He stated that he was prescribed a zpak and prednisone 20mg  last month by Dr. . His breathing improved while he was taking these medications but since he had finished both rounds, his breathing has gotten worse.   Increased wheezing, SOB and fatigue. He has a productive cough with clear phlegm. Denied any fevers or body aches.   He looked on the bottles for the zpak and prednisone and saw that both of them had several refills. He was unsure if he needs to be on these medications for the long term.   I looked at the last OV note on 05/21/19 and it did not mention anything about being on a zpak or prednisone long term.   Dr. 05/23/19, please advise. Thanks.

## 2019-06-16 MED ORDER — PREDNISONE 20 MG PO TABS: 20.0000 mg | ORAL_TABLET | Freq: Every day | ORAL | 4 refills | Status: DC

## 2019-06-16 NOTE — Telephone Encounter (Signed)
I called the pt x 2 and the line rings a few times and then stops- no option to leave a msg

## 2019-06-16 NOTE — Telephone Encounter (Signed)
Can use refill for Prednisone only otherwise can do Prednisone 20 mg daily until next OV.

## 2019-06-16 NOTE — Telephone Encounter (Signed)
Please advise thanks.

## 2019-06-16 NOTE — Telephone Encounter (Signed)
Called and spoke with patient in regards to prednisone refill. Patient states that she has a RX from last visit with Dr. Belia Heman and he got it filled yesterday. Informed patient that Dr. Belia Heman said: Can use refill for Prednisone only otherwise can do Prednisone 20 mg daily until next OV.    I will send in RX that is good until follow up which should be in October (6 month f/u). Encouraged patient that if his breathing got worse or started having more symptoms to call the office so that we could get him in for an appointment or to go to urgent care or emergency room to be evaluated. Patient expressed understanding. Nothing further needed at this time.

## 2019-07-08 DIAGNOSIS — Z0000 Encounter for general adult medical examination without abnormal findings: Secondary | ICD-10-CM | POA: Diagnosis not present

## 2019-07-08 DIAGNOSIS — K219 Gastro-esophageal reflux disease without esophagitis: Secondary | ICD-10-CM | POA: Diagnosis not present

## 2019-07-08 DIAGNOSIS — Z87891 Personal history of nicotine dependence: Secondary | ICD-10-CM | POA: Diagnosis not present

## 2019-07-08 DIAGNOSIS — N1831 Chronic kidney disease, stage 3a: Secondary | ICD-10-CM | POA: Diagnosis not present

## 2019-07-08 DIAGNOSIS — J449 Chronic obstructive pulmonary disease, unspecified: Secondary | ICD-10-CM | POA: Diagnosis not present

## 2019-07-08 DIAGNOSIS — G3184 Mild cognitive impairment, so stated: Secondary | ICD-10-CM | POA: Diagnosis not present

## 2019-07-08 DIAGNOSIS — R252 Cramp and spasm: Secondary | ICD-10-CM | POA: Diagnosis not present

## 2019-09-15 DIAGNOSIS — M9905 Segmental and somatic dysfunction of pelvic region: Secondary | ICD-10-CM | POA: Diagnosis not present

## 2019-09-15 DIAGNOSIS — M9903 Segmental and somatic dysfunction of lumbar region: Secondary | ICD-10-CM | POA: Diagnosis not present

## 2019-09-15 DIAGNOSIS — M5136 Other intervertebral disc degeneration, lumbar region: Secondary | ICD-10-CM | POA: Diagnosis not present

## 2019-09-15 DIAGNOSIS — M955 Acquired deformity of pelvis: Secondary | ICD-10-CM | POA: Diagnosis not present

## 2019-09-18 DIAGNOSIS — M9903 Segmental and somatic dysfunction of lumbar region: Secondary | ICD-10-CM | POA: Diagnosis not present

## 2019-09-18 DIAGNOSIS — M5136 Other intervertebral disc degeneration, lumbar region: Secondary | ICD-10-CM | POA: Diagnosis not present

## 2019-09-18 DIAGNOSIS — M9905 Segmental and somatic dysfunction of pelvic region: Secondary | ICD-10-CM | POA: Diagnosis not present

## 2019-09-18 DIAGNOSIS — M955 Acquired deformity of pelvis: Secondary | ICD-10-CM | POA: Diagnosis not present

## 2019-09-22 DIAGNOSIS — M9905 Segmental and somatic dysfunction of pelvic region: Secondary | ICD-10-CM | POA: Diagnosis not present

## 2019-09-22 DIAGNOSIS — M9903 Segmental and somatic dysfunction of lumbar region: Secondary | ICD-10-CM | POA: Diagnosis not present

## 2019-09-22 DIAGNOSIS — M955 Acquired deformity of pelvis: Secondary | ICD-10-CM | POA: Diagnosis not present

## 2019-09-22 DIAGNOSIS — M5136 Other intervertebral disc degeneration, lumbar region: Secondary | ICD-10-CM | POA: Diagnosis not present

## 2019-09-30 DEATH — deceased

## 2019-11-04 ENCOUNTER — Other Ambulatory Visit: Payer: Self-pay | Admitting: Internal Medicine

## 2019-11-04 DIAGNOSIS — J441 Chronic obstructive pulmonary disease with (acute) exacerbation: Secondary | ICD-10-CM

## 2019-12-02 ENCOUNTER — Other Ambulatory Visit: Payer: Self-pay | Admitting: Internal Medicine

## 2019-12-30 ENCOUNTER — Other Ambulatory Visit: Payer: Self-pay | Admitting: Internal Medicine

## 2019-12-30 DIAGNOSIS — Z23 Encounter for immunization: Secondary | ICD-10-CM | POA: Diagnosis not present

## 2020-01-21 ENCOUNTER — Other Ambulatory Visit: Payer: Self-pay | Admitting: Internal Medicine

## 2020-02-17 ENCOUNTER — Other Ambulatory Visit: Payer: Self-pay | Admitting: Internal Medicine

## 2020-03-08 ENCOUNTER — Other Ambulatory Visit: Payer: Self-pay | Admitting: Internal Medicine

## 2020-03-08 DIAGNOSIS — J9611 Chronic respiratory failure with hypoxia: Secondary | ICD-10-CM

## 2020-03-08 DIAGNOSIS — J441 Chronic obstructive pulmonary disease with (acute) exacerbation: Secondary | ICD-10-CM

## 2020-03-13 ENCOUNTER — Other Ambulatory Visit: Payer: Self-pay | Admitting: Internal Medicine

## 2020-03-14 DIAGNOSIS — H02889 Meibomian gland dysfunction of unspecified eye, unspecified eyelid: Secondary | ICD-10-CM | POA: Diagnosis not present

## 2020-03-14 DIAGNOSIS — H2513 Age-related nuclear cataract, bilateral: Secondary | ICD-10-CM | POA: Diagnosis not present

## 2020-03-14 DIAGNOSIS — H16223 Keratoconjunctivitis sicca, not specified as Sjogren's, bilateral: Secondary | ICD-10-CM | POA: Diagnosis not present

## 2020-03-14 DIAGNOSIS — H353132 Nonexudative age-related macular degeneration, bilateral, intermediate dry stage: Secondary | ICD-10-CM | POA: Diagnosis not present

## 2020-03-31 ENCOUNTER — Telehealth: Payer: Self-pay | Admitting: Internal Medicine

## 2020-03-31 MED ORDER — AZITHROMYCIN 250 MG PO TABS: ORAL_TABLET | ORAL | 0 refills | Status: AC

## 2020-03-31 MED ORDER — PREDNISONE 20 MG PO TABS: 20.0000 mg | ORAL_TABLET | Freq: Every day | ORAL | 0 refills | Status: DC

## 2020-03-31 NOTE — Telephone Encounter (Signed)
Spoke to patient, who is requesting refill on zpak. Patient reports of prod cough with clear sputum, nasal/head congestion and increased sob. Sx have been present x1w He is using albuterol Q4H, brovana BID and pulmicort BID.   Dr. Belia Heman, please advise. Thanks

## 2020-03-31 NOTE — Telephone Encounter (Signed)
Patient is returning phone call. Patient phone number is [phone].

## 2020-03-31 NOTE — Telephone Encounter (Signed)
Lm for patient.  

## 2020-03-31 NOTE — Telephone Encounter (Signed)
Z pak and pred 20 mg daily for 10 days

## 2020-03-31 NOTE — Telephone Encounter (Signed)
Patient is aware of below recommendations. He voiced his understanding and had no further questions.  Rx for zpak and prednisone has been sent to preferred pharmacy.  Nothing further needed at this time.

## 2020-04-07 ENCOUNTER — Other Ambulatory Visit: Payer: Self-pay | Admitting: Internal Medicine

## 2020-04-19 ENCOUNTER — Telehealth: Payer: Self-pay | Admitting: Internal Medicine

## 2020-04-19 MED ORDER — PREDNISONE 20 MG PO TABS: ORAL_TABLET | ORAL | 0 refills | Status: DC

## 2020-04-19 NOTE — Telephone Encounter (Signed)
Rx for prednisone 20mg  has been sent to preferred pharmacy.  Appt scheduled for 06/01/2020 at 11:15. Patient is aware and voiced his understanding.  Nothing further needed at this time.

## 2020-04-26 DIAGNOSIS — H02889 Meibomian gland dysfunction of unspecified eye, unspecified eyelid: Secondary | ICD-10-CM | POA: Diagnosis not present

## 2020-04-26 DIAGNOSIS — H16223 Keratoconjunctivitis sicca, not specified as Sjogren's, bilateral: Secondary | ICD-10-CM | POA: Diagnosis not present

## 2020-05-15 ENCOUNTER — Other Ambulatory Visit: Payer: Self-pay | Admitting: Internal Medicine

## 2020-05-16 DIAGNOSIS — M9902 Segmental and somatic dysfunction of thoracic region: Secondary | ICD-10-CM | POA: Diagnosis not present

## 2020-05-16 DIAGNOSIS — M5134 Other intervertebral disc degeneration, thoracic region: Secondary | ICD-10-CM | POA: Diagnosis not present

## 2020-05-16 DIAGNOSIS — M9903 Segmental and somatic dysfunction of lumbar region: Secondary | ICD-10-CM | POA: Diagnosis not present

## 2020-05-16 DIAGNOSIS — M62830 Muscle spasm of back: Secondary | ICD-10-CM | POA: Diagnosis not present

## 2020-05-17 DIAGNOSIS — M5134 Other intervertebral disc degeneration, thoracic region: Secondary | ICD-10-CM | POA: Diagnosis not present

## 2020-05-17 DIAGNOSIS — M62830 Muscle spasm of back: Secondary | ICD-10-CM | POA: Diagnosis not present

## 2020-05-17 DIAGNOSIS — M9902 Segmental and somatic dysfunction of thoracic region: Secondary | ICD-10-CM | POA: Diagnosis not present

## 2020-05-17 DIAGNOSIS — M9903 Segmental and somatic dysfunction of lumbar region: Secondary | ICD-10-CM | POA: Diagnosis not present

## 2020-05-25 DIAGNOSIS — M9903 Segmental and somatic dysfunction of lumbar region: Secondary | ICD-10-CM | POA: Diagnosis not present

## 2020-05-25 DIAGNOSIS — M62830 Muscle spasm of back: Secondary | ICD-10-CM | POA: Diagnosis not present

## 2020-05-25 DIAGNOSIS — M9902 Segmental and somatic dysfunction of thoracic region: Secondary | ICD-10-CM | POA: Diagnosis not present

## 2020-05-25 DIAGNOSIS — M5134 Other intervertebral disc degeneration, thoracic region: Secondary | ICD-10-CM | POA: Diagnosis not present

## 2020-05-27 DIAGNOSIS — M9903 Segmental and somatic dysfunction of lumbar region: Secondary | ICD-10-CM | POA: Diagnosis not present

## 2020-05-27 DIAGNOSIS — M5134 Other intervertebral disc degeneration, thoracic region: Secondary | ICD-10-CM | POA: Diagnosis not present

## 2020-05-27 DIAGNOSIS — M62830 Muscle spasm of back: Secondary | ICD-10-CM | POA: Diagnosis not present

## 2020-05-27 DIAGNOSIS — M9902 Segmental and somatic dysfunction of thoracic region: Secondary | ICD-10-CM | POA: Diagnosis not present

## 2020-05-31 DIAGNOSIS — M9903 Segmental and somatic dysfunction of lumbar region: Secondary | ICD-10-CM | POA: Diagnosis not present

## 2020-05-31 DIAGNOSIS — M62830 Muscle spasm of back: Secondary | ICD-10-CM | POA: Diagnosis not present

## 2020-05-31 DIAGNOSIS — M5134 Other intervertebral disc degeneration, thoracic region: Secondary | ICD-10-CM | POA: Diagnosis not present

## 2020-05-31 DIAGNOSIS — M9902 Segmental and somatic dysfunction of thoracic region: Secondary | ICD-10-CM | POA: Diagnosis not present

## 2020-06-01 ENCOUNTER — Ambulatory Visit (INDEPENDENT_AMBULATORY_CARE_PROVIDER_SITE_OTHER): Payer: Medicare Other | Admitting: Internal Medicine

## 2020-06-01 ENCOUNTER — Encounter: Payer: Self-pay | Admitting: Internal Medicine

## 2020-06-01 ENCOUNTER — Other Ambulatory Visit: Payer: Self-pay

## 2020-06-01 VITALS — BP 138/80 | HR 96 | Temp 97.3°F | Ht 63.5 in | Wt 139.8 lb

## 2020-06-01 DIAGNOSIS — J9611 Chronic respiratory failure with hypoxia: Secondary | ICD-10-CM

## 2020-06-01 DIAGNOSIS — J441 Chronic obstructive pulmonary disease with (acute) exacerbation: Secondary | ICD-10-CM

## 2020-06-01 MED ORDER — PREDNISONE 20 MG PO TABS: 20.0000 mg | ORAL_TABLET | Freq: Every day | ORAL | 2 refills | Status: DC

## 2020-06-01 NOTE — Progress Notes (Signed)
Name: Charles Webster MRN: [id number] DOB: [birth year]     CONSULTATION DATE: 5.9.19 REFERRING MD : Quentin Cornwall  STUDIES:   4.7.18   CXR independently reviewed by Me  Increased lung volumes No pneumonia No effusions  Previous HISTORY OF PRESENT ILLNESS: 75 year old pleasant white male seen today for assessment for COPD Patient states he has been diagnosed with COPD for approximately 2 years ago however based on further questioning patient has chronic shortness of breath and progressive dyspnea exertion for the past 5 years  Patient has a history of tobacco abuse 1 pack a day for 40 years Patient worked at the [employer] and was exposed to paints 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 Based on his social history patient has a lot of advised mental exposures that would put him at risk for COPD  At this time patient states that he has progressive shortness of breath over the last several months in conjunction with allergic rhinitis Patient was diagnosed with bronchitis pneumonia 2 years ago was started on Advair and did well in approximately 1 year ago was admitted for COPD exacerbation and then was started on Spiriva  His inhaler therapy was working fine according to him but seem to have stopped working over the last several months Patient has chronic chest congestion and cough chronic cough with the productive sputum  No signs of infection at this time no active wheezing at this time no respiratory distress at this time  Patient states 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 at [facility]  CC Severe COPD   HPI Patient follow-up assessment for severe COPD Patient with progressive shortness of breath dyspnea exertion Patient with increased wheezing and coughing Patient has multiple bouts of COPD exacerbation   Patient has been switched to nebulized therapy Pulmicort twice daily DuoNebs every 4 This  regimen seems to help him however he is an active COPD exacerbation  Stopped taking traditional inhaler therapy due to poor respiratory insufficiency  + exacerbation at this time No evidence of heart failure at this time No evidence or signs of infection at this time + respiratory distress No fevers, chills, nausea, vomiting, diarrhea No evidence of lower extremity edema No evidence hemoptysis  Patient had ambulating pulse ox in the office and significantly dropped to 88% with increased work of breathing with elevated heart rate Patient was placed on oxygen therapy and O2 sats increased to 93 to 95% immediately At this time patient does need and require oxygen therapy at this time   Patient has failed all types of inhaler therapy as well as albuterol neb therapy  Outpatient Encounter Medications as of 06/01/2020  Medication Sig  . albuterol (VENTOLIN HFA) 108 (90 Base) MCG/ACT inhaler Inhale 2 puffs into the lungs every 6 (six) hours as needed for wheezing or shortness of breath.  Marland Kitchen azithromycin (ZITHROMAX Z-PAK) 250 MG tablet Take 2 tablets on Day 1 and then 1 tablet daily till gone.  Marland Kitchen azithromycin (ZITHROMAX) 250 MG tablet Take 1 tablet (250 mg total) by mouth as directed.  . budesonide (PULMICORT) 0.5 MG/2ML nebulizer solution INHALE 1 VIAL TWICE A DAY  . cetirizine (ZYRTEC) 10 MG tablet Take 10 mg by mouth daily.  . fluconazole (DIFLUCAN) 100 MG tablet Take 1 tablet (100 mg total) by mouth daily.  Marland Kitchen ipratropium-albuterol (DUONEB) 0.5-2.5 (3) MG/3ML SOLN TAKE 3 MLS BY NEBULIZATION EVERY 4 (FOUR) HOURS AS NEEDED. DX:J44.9  .  omeprazole (PRILOSEC) 40 MG capsule Take 40 mg by mouth daily.  . predniSONE (DELTASONE) 20 MG tablet TAKE 1 TABLET BY MOUTH EVERY DAY WITH BREAKFAST  . RESTASIS 0.05 % ophthalmic emulsion   . Spacer/Aero-Holding Chambers (AEROCHAMBER PLUS) inhaler Use as instructed  . Azelastine-Fluticasone (DYMISTA) 137-50 MCG/ACT SUSP Place 1 spray into the nose 2 (two) times  daily.  . montelukast (SINGULAIR) 10 MG tablet Take 1 tablet (10 mg total) by mouth daily. (Patient not taking: Reported on 12/13/2017)   Facility-Administered Encounter Medications as of 06/01/2020  Medication  . arformoterol (BROVANA) nebulizer solution 15 mcg     Review of Systems:  Gen:  Denies  fever, sweats, chills weight loss  HEENT: Denies blurred vision, double vision, ear pain, eye pain, hearing loss, nose bleeds, sore throat Cardiac:  No dizziness, chest pain or heaviness, chest tightness,edema, No JVD Resp:   + cough, +sputum production, +shortness of breath,+wheezing, -hemoptysis,  Gi: Denies swallowing difficulty, stomach pain, nausea or vomiting, diarrhea, constipation, bowel incontinence Gu:  Denies bladder incontinence, burning urine Other:  All other systems negative    BP 138/80 (BP Location: Left Arm, Patient Position: Sitting, Cuff Size: Normal)   Pulse 96   Temp (!) 97.3 F (36.3 C) (Temporal)   Ht 5' 3.5" (1.613 m)   Wt 139 lb 12.8 oz (63.4 kg)   SpO2 92%   BMI 24.38 kg/m    Physical Examination:   General Appearance: + distress  Neuro:without focal findings,  speech normal,  HEENT: PERRLA, EOM intact.   Pulmonary: normal breath sounds, + wheezing.  CardiovascularNormal S1,S2.  No m/r/g.   Abdomen: Benign, Soft, non-tender. Renal:  No costovertebral tenderness  GU:  Not performed at this time. Endoc: No evident thyromegaly Skin:   warm, no rashes, no ecchymosis  Extremities: normal, no cyanosis, clubbing. PSYCHIATRIC: Mood, affect within normal limits.   ALL OTHER ROS ARE NEGATIVE   ASSESSMENT / PLAN:  75 year old pleasant white male white male with end-stage COPD Gold stage D  Patient with recurrent bouts of COPD exacerbation  COPD end-stage Gold stage D Long standing prednisone therapy which is helping a lot but has been exposed to lots of pollen which is causing congestion dn exacerbation  COPD exacerbation due to pollen We will continue  prednisone 20 mg daily Will not prescribe Z-Pak  Gold stage D COPD Continue nebulized therapy Patient cannot use traditional inhaler therapy anymore due to respiratory insufficiency and severe COPD   Chronic hypoxic respiratory failure from COPD Patient uses and benefits from oxygen therapy at nighttime He needs this for survival We will do an ambulating pulse oximetry while in office today as he is hypoxic Patient will most likely need oxygen therapy continuously I explained benefits of oxygen therapy including survival Patient will benefit from POC  MEDICATION ADJUSTMENTS/LABS AND TESTS ORDERED: Continue nebs as prescribed Prednisone 20 mg daily for 10 days Oxygen as prescribed   CURRENT MEDICATIONS REVIEWED AT LENGTH WITH PATIENT TODAY   Patient satisfied with Plan of action and management. All questions answered   Follow-up in 6 months   Total time spent 38 minutes   Lucie Leather, M.D.  [facility]  Medical Director [facility] Medical Director [facility] Cardio-Pulmonary Department

## 2020-06-01 NOTE — Patient Instructions (Addendum)
Continue nebulizers as prescribed  Duo nebs as needed  Pulmicort twice daily  Prednisone 20 mg daily  Continue oxygen as prescribed will need POC and will need 2 L [place] with exertion

## 2020-06-04 ENCOUNTER — Other Ambulatory Visit: Payer: Self-pay | Admitting: Internal Medicine

## 2020-06-04 DIAGNOSIS — J9611 Chronic respiratory failure with hypoxia: Secondary | ICD-10-CM

## 2020-06-04 DIAGNOSIS — J441 Chronic obstructive pulmonary disease with (acute) exacerbation: Secondary | ICD-10-CM

## 2020-06-06 DIAGNOSIS — M62830 Muscle spasm of back: Secondary | ICD-10-CM | POA: Diagnosis not present

## 2020-06-06 DIAGNOSIS — M9903 Segmental and somatic dysfunction of lumbar region: Secondary | ICD-10-CM | POA: Diagnosis not present

## 2020-06-06 DIAGNOSIS — M9902 Segmental and somatic dysfunction of thoracic region: Secondary | ICD-10-CM | POA: Diagnosis not present

## 2020-06-06 DIAGNOSIS — M5134 Other intervertebral disc degeneration, thoracic region: Secondary | ICD-10-CM | POA: Diagnosis not present

## 2020-06-12 ENCOUNTER — Other Ambulatory Visit: Payer: Self-pay | Admitting: Internal Medicine

## 2020-06-13 DIAGNOSIS — M9902 Segmental and somatic dysfunction of thoracic region: Secondary | ICD-10-CM | POA: Diagnosis not present

## 2020-06-13 DIAGNOSIS — M5134 Other intervertebral disc degeneration, thoracic region: Secondary | ICD-10-CM | POA: Diagnosis not present

## 2020-06-13 DIAGNOSIS — M9903 Segmental and somatic dysfunction of lumbar region: Secondary | ICD-10-CM | POA: Diagnosis not present

## 2020-06-13 DIAGNOSIS — M62830 Muscle spasm of back: Secondary | ICD-10-CM | POA: Diagnosis not present

## 2020-07-11 DIAGNOSIS — M62830 Muscle spasm of back: Secondary | ICD-10-CM | POA: Diagnosis not present

## 2020-07-11 DIAGNOSIS — M9902 Segmental and somatic dysfunction of thoracic region: Secondary | ICD-10-CM | POA: Diagnosis not present

## 2020-07-11 DIAGNOSIS — M9903 Segmental and somatic dysfunction of lumbar region: Secondary | ICD-10-CM | POA: Diagnosis not present

## 2020-07-11 DIAGNOSIS — M5134 Other intervertebral disc degeneration, thoracic region: Secondary | ICD-10-CM | POA: Diagnosis not present

## 2020-07-13 DIAGNOSIS — M5134 Other intervertebral disc degeneration, thoracic region: Secondary | ICD-10-CM | POA: Diagnosis not present

## 2020-07-13 DIAGNOSIS — M62830 Muscle spasm of back: Secondary | ICD-10-CM | POA: Diagnosis not present

## 2020-07-13 DIAGNOSIS — M9902 Segmental and somatic dysfunction of thoracic region: Secondary | ICD-10-CM | POA: Diagnosis not present

## 2020-07-13 DIAGNOSIS — M9903 Segmental and somatic dysfunction of lumbar region: Secondary | ICD-10-CM | POA: Diagnosis not present

## 2020-07-15 ENCOUNTER — Other Ambulatory Visit: Payer: Self-pay | Admitting: Internal Medicine

## 2020-07-18 DIAGNOSIS — M5134 Other intervertebral disc degeneration, thoracic region: Secondary | ICD-10-CM | POA: Diagnosis not present

## 2020-07-18 DIAGNOSIS — M9903 Segmental and somatic dysfunction of lumbar region: Secondary | ICD-10-CM | POA: Diagnosis not present

## 2020-07-18 DIAGNOSIS — M9902 Segmental and somatic dysfunction of thoracic region: Secondary | ICD-10-CM | POA: Diagnosis not present

## 2020-07-18 DIAGNOSIS — M62830 Muscle spasm of back: Secondary | ICD-10-CM | POA: Diagnosis not present

## 2020-07-20 DIAGNOSIS — M9903 Segmental and somatic dysfunction of lumbar region: Secondary | ICD-10-CM | POA: Diagnosis not present

## 2020-07-20 DIAGNOSIS — M9902 Segmental and somatic dysfunction of thoracic region: Secondary | ICD-10-CM | POA: Diagnosis not present

## 2020-07-20 DIAGNOSIS — M62830 Muscle spasm of back: Secondary | ICD-10-CM | POA: Diagnosis not present

## 2020-07-20 DIAGNOSIS — M5134 Other intervertebral disc degeneration, thoracic region: Secondary | ICD-10-CM | POA: Diagnosis not present

## 2020-07-22 DIAGNOSIS — M9903 Segmental and somatic dysfunction of lumbar region: Secondary | ICD-10-CM | POA: Diagnosis not present

## 2020-07-22 DIAGNOSIS — M62830 Muscle spasm of back: Secondary | ICD-10-CM | POA: Diagnosis not present

## 2020-07-22 DIAGNOSIS — M5134 Other intervertebral disc degeneration, thoracic region: Secondary | ICD-10-CM | POA: Diagnosis not present

## 2020-07-22 DIAGNOSIS — M9902 Segmental and somatic dysfunction of thoracic region: Secondary | ICD-10-CM | POA: Diagnosis not present

## 2020-07-25 DIAGNOSIS — M62830 Muscle spasm of back: Secondary | ICD-10-CM | POA: Diagnosis not present

## 2020-07-25 DIAGNOSIS — M5134 Other intervertebral disc degeneration, thoracic region: Secondary | ICD-10-CM | POA: Diagnosis not present

## 2020-07-25 DIAGNOSIS — M9902 Segmental and somatic dysfunction of thoracic region: Secondary | ICD-10-CM | POA: Diagnosis not present

## 2020-07-25 DIAGNOSIS — M9903 Segmental and somatic dysfunction of lumbar region: Secondary | ICD-10-CM | POA: Diagnosis not present

## 2020-08-02 DIAGNOSIS — M9902 Segmental and somatic dysfunction of thoracic region: Secondary | ICD-10-CM | POA: Diagnosis not present

## 2020-08-02 DIAGNOSIS — M9903 Segmental and somatic dysfunction of lumbar region: Secondary | ICD-10-CM | POA: Diagnosis not present

## 2020-08-02 DIAGNOSIS — M62830 Muscle spasm of back: Secondary | ICD-10-CM | POA: Diagnosis not present

## 2020-08-02 DIAGNOSIS — M5134 Other intervertebral disc degeneration, thoracic region: Secondary | ICD-10-CM | POA: Diagnosis not present

## 2020-08-04 DIAGNOSIS — Z20822 Contact with and (suspected) exposure to covid-19: Secondary | ICD-10-CM | POA: Diagnosis not present

## 2020-08-08 DIAGNOSIS — M9903 Segmental and somatic dysfunction of lumbar region: Secondary | ICD-10-CM | POA: Diagnosis not present

## 2020-08-08 DIAGNOSIS — M9902 Segmental and somatic dysfunction of thoracic region: Secondary | ICD-10-CM | POA: Diagnosis not present

## 2020-08-08 DIAGNOSIS — M5134 Other intervertebral disc degeneration, thoracic region: Secondary | ICD-10-CM | POA: Diagnosis not present

## 2020-08-08 DIAGNOSIS — M62830 Muscle spasm of back: Secondary | ICD-10-CM | POA: Diagnosis not present

## 2020-08-18 ENCOUNTER — Other Ambulatory Visit: Payer: Self-pay | Admitting: Internal Medicine

## 2020-08-22 DIAGNOSIS — M9902 Segmental and somatic dysfunction of thoracic region: Secondary | ICD-10-CM | POA: Diagnosis not present

## 2020-08-22 DIAGNOSIS — M9903 Segmental and somatic dysfunction of lumbar region: Secondary | ICD-10-CM | POA: Diagnosis not present

## 2020-08-22 DIAGNOSIS — M5134 Other intervertebral disc degeneration, thoracic region: Secondary | ICD-10-CM | POA: Diagnosis not present

## 2020-08-22 DIAGNOSIS — M62830 Muscle spasm of back: Secondary | ICD-10-CM | POA: Diagnosis not present

## 2020-08-24 DIAGNOSIS — M62830 Muscle spasm of back: Secondary | ICD-10-CM | POA: Diagnosis not present

## 2020-08-24 DIAGNOSIS — M9903 Segmental and somatic dysfunction of lumbar region: Secondary | ICD-10-CM | POA: Diagnosis not present

## 2020-08-24 DIAGNOSIS — M9902 Segmental and somatic dysfunction of thoracic region: Secondary | ICD-10-CM | POA: Diagnosis not present

## 2020-08-24 DIAGNOSIS — M5134 Other intervertebral disc degeneration, thoracic region: Secondary | ICD-10-CM | POA: Diagnosis not present

## 2020-08-29 DIAGNOSIS — M9903 Segmental and somatic dysfunction of lumbar region: Secondary | ICD-10-CM | POA: Diagnosis not present

## 2020-08-29 DIAGNOSIS — M9902 Segmental and somatic dysfunction of thoracic region: Secondary | ICD-10-CM | POA: Diagnosis not present

## 2020-08-29 DIAGNOSIS — M5134 Other intervertebral disc degeneration, thoracic region: Secondary | ICD-10-CM | POA: Diagnosis not present

## 2020-08-29 DIAGNOSIS — M62830 Muscle spasm of back: Secondary | ICD-10-CM | POA: Diagnosis not present

## 2020-08-30 DIAGNOSIS — M9902 Segmental and somatic dysfunction of thoracic region: Secondary | ICD-10-CM | POA: Diagnosis not present

## 2020-08-30 DIAGNOSIS — M9903 Segmental and somatic dysfunction of lumbar region: Secondary | ICD-10-CM | POA: Diagnosis not present

## 2020-08-30 DIAGNOSIS — M62830 Muscle spasm of back: Secondary | ICD-10-CM | POA: Diagnosis not present

## 2020-08-30 DIAGNOSIS — M5134 Other intervertebral disc degeneration, thoracic region: Secondary | ICD-10-CM | POA: Diagnosis not present

## 2020-08-31 DIAGNOSIS — M5134 Other intervertebral disc degeneration, thoracic region: Secondary | ICD-10-CM | POA: Diagnosis not present

## 2020-08-31 DIAGNOSIS — M62830 Muscle spasm of back: Secondary | ICD-10-CM | POA: Diagnosis not present

## 2020-08-31 DIAGNOSIS — M9903 Segmental and somatic dysfunction of lumbar region: Secondary | ICD-10-CM | POA: Diagnosis not present

## 2020-08-31 DIAGNOSIS — M9902 Segmental and somatic dysfunction of thoracic region: Secondary | ICD-10-CM | POA: Diagnosis not present

## 2020-09-01 ENCOUNTER — Other Ambulatory Visit: Payer: Self-pay | Admitting: Internal Medicine

## 2020-09-01 DIAGNOSIS — J9611 Chronic respiratory failure with hypoxia: Secondary | ICD-10-CM

## 2020-09-01 DIAGNOSIS — J441 Chronic obstructive pulmonary disease with (acute) exacerbation: Secondary | ICD-10-CM

## 2020-09-02 DIAGNOSIS — M9903 Segmental and somatic dysfunction of lumbar region: Secondary | ICD-10-CM | POA: Diagnosis not present

## 2020-09-02 DIAGNOSIS — M5134 Other intervertebral disc degeneration, thoracic region: Secondary | ICD-10-CM | POA: Diagnosis not present

## 2020-09-02 DIAGNOSIS — M9902 Segmental and somatic dysfunction of thoracic region: Secondary | ICD-10-CM | POA: Diagnosis not present

## 2020-09-02 DIAGNOSIS — M62830 Muscle spasm of back: Secondary | ICD-10-CM | POA: Diagnosis not present

## 2020-09-05 ENCOUNTER — Telehealth: Payer: Self-pay | Admitting: Internal Medicine

## 2020-09-05 DIAGNOSIS — M9902 Segmental and somatic dysfunction of thoracic region: Secondary | ICD-10-CM | POA: Diagnosis not present

## 2020-09-05 DIAGNOSIS — M9903 Segmental and somatic dysfunction of lumbar region: Secondary | ICD-10-CM | POA: Diagnosis not present

## 2020-09-05 DIAGNOSIS — M5134 Other intervertebral disc degeneration, thoracic region: Secondary | ICD-10-CM | POA: Diagnosis not present

## 2020-09-05 DIAGNOSIS — M62830 Muscle spasm of back: Secondary | ICD-10-CM | POA: Diagnosis not present

## 2020-09-05 MED ORDER — PREDNISONE 20 MG PO TABS: 40.0000 mg | ORAL_TABLET | Freq: Every day | ORAL | 0 refills | Status: DC

## 2020-09-05 MED ORDER — AZITHROMYCIN 250 MG PO TABS: ORAL_TABLET | ORAL | 0 refills | Status: DC

## 2020-09-05 NOTE — Telephone Encounter (Signed)
Primary Pulmonologist: Dr. Belia Heman Last office visit and with whom: 06/01/20  [place] What do we see them for (pulmonary problems): COPD with acute exacerbation, Chronic respiratory failure and hypoxia Last OV assessment/plan:    ASSESSMENT / PLAN:   75 year old pleasant white male with end-stage COPD Gold stage D Patient with recurrent bouts of COPD exacerbation   COPD end-stage Gold stage D Long standing prednisone therapy which is helping a lot but has been exposed to lots of pollen which is causing congestion dn exacerbation   COPD exacerbation due to pollen We will continue prednisone 20 mg daily Will not prescribe Z-Pak   Gold stage D COPD Continue nebulized therapy Patient cannot use traditional inhaler therapy anymore due to respiratory insufficiency and severe COPD     Chronic hypoxic respiratory failure from COPD Patient uses and benefits from oxygen therapy at nighttime He needs this for survival We will do an ambulating pulse oximetry while in office today as he is hypoxic Patient will most likely need oxygen therapy continuously I explained benefits of oxygen therapy including survival Patient will benefit from POC   MEDICATION ADJUSTMENTS/LABS AND TESTS ORDERED: Continue nebs as prescribed Prednisone 20 mg daily for 10 days Oxygen as prescribed     CURRENT MEDICATIONS REVIEWED AT LENGTH WITH PATIENT TODAY     Patient satisfied with Plan of action and management. All questions answered     Follow-up in 6 months     Total time spent 38 minutes     Lucie Leather, M.D. [facility] Medical Director [facility] Medical Director [facility] Cardio-Pulmonary Department                          Patient Instructions by Erin Fulling, MD at 06/01/2020 11:15 AM  Author: Erin Fulling, MD Author Type: Physician Filed: 06/01/2020 11:37 AM  Note Status: Addendum Cosign: Cosign Not Required Encounter Date: 06/01/2020  Editor:  Erin Fulling, MD (Physician)      Prior Versions: 1. Erin Fulling, MD (Physician) at 06/01/2020 11:22 AM - Signed    Continue nebulizers as prescribed Duo nebs as needed Pulmicort twice daily Prednisone 20 mg daily Continue oxygen as prescribed will need POC and will need 2 L [place] with exertion        Orthostatic Vitals Recorded in This Encounter   06/01/2020  1113     Patient Position: Sitting  BP Location: Left Arm  Cuff Size: Normal   Instructions    Return in about 6 months (around 12/02/2020).  Continue nebulizers as prescribed Duo nebs as needed Pulmicort twice daily Prednisone 20 mg daily Continue oxygen as prescribed will need POC and will need 2 L [place] with exertion       Reason for call: Started having symptoms 2 days ago of cough with lots of clear mucous and wheezing.  He denies any fever.  Turned O2 up to 2L at rest, up to 3L with exertion (POC) and still has sob. Takes prednisone 20 mg daily.   He is using his Albuterol, brovana and duo neb as prescribed.  He is requesting a prescription for something for bronchitis.    Beth,  Please advise.  Thank you.  (examples of things to ask: : When did symptoms start? Fever? Cough? Productive? Color to sputum? More sputum than usual? Wheezing? Have you needed increased oxygen? Are you taking your respiratory medications? What over the counter measures have you tried?)  Allergies  Allergen Reactions   Alpha Blocker Quinazolines    Cephalexin Other (See Comments)   Clindamycin/Lincomycin Nausea Only   Doxycycline Other (See Comments)   Levaquin [Levofloxacin]    Penicillins    Singulair [Montelukast Sodium] Other (See Comments)    Flu like symptoms   Sulfa Antibiotics Other (See Comments)   Tamsulosin Other (See Comments)    Immunization History  Administered Date(s) Administered   Influenza, High Dose Seasonal PF 11/05/2016, 11/08/2017, 11/27/2018, 11/27/2018, 12/30/2019   Influenza-Unspecified 11/05/2016    PFIZER(Purple Top)SARS-COV-2 Vaccination 04/01/2019, 04/22/2019

## 2020-09-05 NOTE — Telephone Encounter (Signed)
He should take mucinex 1200mg  twice a day. Go up to 40mg  prednisone x 5 days then return to 20mg  daily. Sending in zpack.

## 2020-09-05 NOTE — Telephone Encounter (Signed)
Call returned to patient, confirmed DOB. Made aware of EW recommendations. Voiced understanding. Patient requested that I send in the prednisone 40mg  x 5 days so as not to get confused. Sent. Aware to take mucinex.   Nothing further needed at this time.

## 2020-09-06 DIAGNOSIS — R051 Acute cough: Secondary | ICD-10-CM | POA: Diagnosis not present

## 2020-09-10 ENCOUNTER — Emergency Department: Payer: Medicare Other

## 2020-09-10 ENCOUNTER — Other Ambulatory Visit: Payer: Self-pay

## 2020-09-10 ENCOUNTER — Observation Stay: Payer: Medicare Other

## 2020-09-10 ENCOUNTER — Inpatient Hospital Stay
Admission: EM | Admit: 2020-09-10 | Discharge: 2020-09-23 | DRG: 190 | Disposition: A | Discharge status: Skilled Nursing Facility | Payer: Medicare Other | Attending: Internal Medicine | Admitting: Internal Medicine

## 2020-09-10 ENCOUNTER — Encounter: Payer: Self-pay | Admitting: Internal Medicine

## 2020-09-10 DIAGNOSIS — R0602 Shortness of breath: Secondary | ICD-10-CM | POA: Diagnosis present

## 2020-09-10 DIAGNOSIS — I10 Essential (primary) hypertension: Secondary | ICD-10-CM | POA: Diagnosis not present

## 2020-09-10 DIAGNOSIS — Z882 Allergy status to sulfonamides status: Secondary | ICD-10-CM

## 2020-09-10 DIAGNOSIS — A0472 Enterocolitis due to Clostridium difficile, not specified as recurrent: Secondary | ICD-10-CM | POA: Diagnosis present

## 2020-09-10 DIAGNOSIS — H04123 Dry eye syndrome of bilateral lacrimal glands: Secondary | ICD-10-CM | POA: Diagnosis not present

## 2020-09-10 DIAGNOSIS — F32A Depression, unspecified: Secondary | ICD-10-CM | POA: Diagnosis present

## 2020-09-10 DIAGNOSIS — F419 Anxiety disorder, unspecified: Secondary | ICD-10-CM | POA: Diagnosis present

## 2020-09-10 DIAGNOSIS — Z881 Allergy status to other antibiotic agents status: Secondary | ICD-10-CM | POA: Diagnosis not present

## 2020-09-10 DIAGNOSIS — Z7401 Bed confinement status: Secondary | ICD-10-CM | POA: Diagnosis not present

## 2020-09-10 DIAGNOSIS — J90 Pleural effusion, not elsewhere classified: Secondary | ICD-10-CM | POA: Diagnosis not present

## 2020-09-10 DIAGNOSIS — K649 Unspecified hemorrhoids: Secondary | ICD-10-CM | POA: Diagnosis present

## 2020-09-10 DIAGNOSIS — J449 Chronic obstructive pulmonary disease, unspecified: Secondary | ICD-10-CM | POA: Diagnosis not present

## 2020-09-10 DIAGNOSIS — Z9981 Dependence on supplemental oxygen: Secondary | ICD-10-CM

## 2020-09-10 DIAGNOSIS — J9621 Acute and chronic respiratory failure with hypoxia: Secondary | ICD-10-CM | POA: Diagnosis present

## 2020-09-10 DIAGNOSIS — K219 Gastro-esophageal reflux disease without esophagitis: Secondary | ICD-10-CM | POA: Diagnosis present

## 2020-09-10 DIAGNOSIS — R0603 Acute respiratory distress: Secondary | ICD-10-CM | POA: Diagnosis not present

## 2020-09-10 DIAGNOSIS — Z888 Allergy status to other drugs, medicaments and biological substances status: Secondary | ICD-10-CM

## 2020-09-10 DIAGNOSIS — Z902 Acquired absence of lung [part of]: Secondary | ICD-10-CM

## 2020-09-10 DIAGNOSIS — I472 Ventricular tachycardia: Secondary | ICD-10-CM | POA: Diagnosis present

## 2020-09-10 DIAGNOSIS — Z8616 Personal history of COVID-19: Secondary | ICD-10-CM | POA: Diagnosis not present

## 2020-09-10 DIAGNOSIS — Z87891 Personal history of nicotine dependence: Secondary | ICD-10-CM

## 2020-09-10 DIAGNOSIS — I5033 Acute on chronic diastolic (congestive) heart failure: Secondary | ICD-10-CM | POA: Diagnosis present

## 2020-09-10 DIAGNOSIS — Z66 Do not resuscitate: Secondary | ICD-10-CM | POA: Diagnosis not present

## 2020-09-10 DIAGNOSIS — R54 Age-related physical debility: Secondary | ICD-10-CM | POA: Diagnosis present

## 2020-09-10 DIAGNOSIS — Z7951 Long term (current) use of inhaled steroids: Secondary | ICD-10-CM

## 2020-09-10 DIAGNOSIS — Z880 Allergy status to penicillin: Secondary | ICD-10-CM

## 2020-09-10 DIAGNOSIS — S51811A Laceration without foreign body of right forearm, initial encounter: Secondary | ICD-10-CM | POA: Diagnosis present

## 2020-09-10 DIAGNOSIS — Z515 Encounter for palliative care: Secondary | ICD-10-CM | POA: Diagnosis not present

## 2020-09-10 DIAGNOSIS — Z8249 Family history of ischemic heart disease and other diseases of the circulatory system: Secondary | ICD-10-CM

## 2020-09-10 DIAGNOSIS — I110 Hypertensive heart disease with heart failure: Secondary | ICD-10-CM | POA: Diagnosis present

## 2020-09-10 DIAGNOSIS — J309 Allergic rhinitis, unspecified: Secondary | ICD-10-CM | POA: Diagnosis not present

## 2020-09-10 DIAGNOSIS — J9811 Atelectasis: Secondary | ICD-10-CM | POA: Diagnosis not present

## 2020-09-10 DIAGNOSIS — M6281 Muscle weakness (generalized): Secondary | ICD-10-CM | POA: Diagnosis not present

## 2020-09-10 DIAGNOSIS — R0600 Dyspnea, unspecified: Secondary | ICD-10-CM | POA: Diagnosis not present

## 2020-09-10 DIAGNOSIS — U071 COVID-19: Secondary | ICD-10-CM | POA: Diagnosis not present

## 2020-09-10 DIAGNOSIS — I5031 Acute diastolic (congestive) heart failure: Secondary | ICD-10-CM | POA: Diagnosis not present

## 2020-09-10 DIAGNOSIS — K449 Diaphragmatic hernia without obstruction or gangrene: Secondary | ICD-10-CM

## 2020-09-10 DIAGNOSIS — R531 Weakness: Secondary | ICD-10-CM | POA: Diagnosis not present

## 2020-09-10 DIAGNOSIS — J439 Emphysema, unspecified: Secondary | ICD-10-CM | POA: Diagnosis not present

## 2020-09-10 DIAGNOSIS — R5381 Other malaise: Secondary | ICD-10-CM | POA: Diagnosis not present

## 2020-09-10 DIAGNOSIS — J441 Chronic obstructive pulmonary disease with (acute) exacerbation: Principal | ICD-10-CM | POA: Diagnosis present

## 2020-09-10 DIAGNOSIS — R262 Difficulty in walking, not elsewhere classified: Secondary | ICD-10-CM | POA: Diagnosis not present

## 2020-09-10 DIAGNOSIS — B370 Candidal stomatitis: Secondary | ICD-10-CM | POA: Diagnosis present

## 2020-09-10 DIAGNOSIS — Z79899 Other long term (current) drug therapy: Secondary | ICD-10-CM | POA: Diagnosis not present

## 2020-09-10 DIAGNOSIS — R0689 Other abnormalities of breathing: Secondary | ICD-10-CM | POA: Diagnosis not present

## 2020-09-10 DIAGNOSIS — Z9889 Other specified postprocedural states: Secondary | ICD-10-CM | POA: Diagnosis not present

## 2020-09-10 DIAGNOSIS — R069 Unspecified abnormalities of breathing: Secondary | ICD-10-CM | POA: Diagnosis not present

## 2020-09-10 DIAGNOSIS — E569 Vitamin deficiency, unspecified: Secondary | ICD-10-CM | POA: Diagnosis not present

## 2020-09-10 DIAGNOSIS — J984 Other disorders of lung: Secondary | ICD-10-CM | POA: Diagnosis not present

## 2020-09-10 LAB — CBC WITH DIFFERENTIAL/PLATELET
Abs Immature Granulocytes: 0.06 10*3/uL (ref 0.00–0.07)
Basophils Absolute: 0 10*3/uL (ref 0.0–0.1)
Basophils Relative: 0 %
Eosinophils Absolute: 0 10*3/uL (ref 0.0–0.5)
Eosinophils Relative: 0 %
HCT: 39.3 % (ref 39.0–52.0)
Hemoglobin: 13.1 g/dL (ref 13.0–17.0)
Immature Granulocytes: 1 %
Lymphocytes Relative: 5 %
Lymphs Abs: 0.5 10*3/uL — ABNORMAL LOW (ref 0.7–4.0)
MCH: 30.9 pg (ref 26.0–34.0)
MCHC: 33.3 g/dL (ref 30.0–36.0)
MCV: 92.7 fL (ref 80.0–100.0)
Monocytes Absolute: 0.4 10*3/uL (ref 0.1–1.0)
Monocytes Relative: 5 %
Neutro Abs: 8.2 10*3/uL — ABNORMAL HIGH (ref 1.7–7.7)
Neutrophils Relative %: 89 %
Platelets: 289 10*3/uL (ref 150–400)
RBC: 4.24 MIL/uL (ref 4.22–5.81)
RDW: 14.2 % (ref 11.5–15.5)
WBC: 9.2 10*3/uL (ref 4.0–10.5)
nRBC: 0 % (ref 0.0–0.2)

## 2020-09-10 LAB — COMPREHENSIVE METABOLIC PANEL
ALT: 22 U/L (ref 0–44)
AST: 27 U/L (ref 15–41)
Albumin: 3.6 g/dL (ref 3.5–5.0)
Alkaline Phosphatase: 75 U/L (ref 38–126)
Anion gap: 12 (ref 5–15)
BUN: 16 mg/dL (ref 8–23)
CO2: 25 mmol/L (ref 22–32)
Calcium: 9.1 mg/dL (ref 8.9–10.3)
Chloride: 97 mmol/L — ABNORMAL LOW (ref 98–111)
Creatinine, Ser: 1.07 mg/dL (ref 0.61–1.24)
GFR, Estimated: >60 mL/min (ref >60)
Glucose, Bld: 118 mg/dL — ABNORMAL HIGH (ref 70–99)
Potassium: 4.2 mmol/L (ref 3.5–5.1)
Sodium: 134 mmol/L — ABNORMAL LOW (ref 135–145)
Total Bilirubin: 1.1 mg/dL (ref 0.3–1.2)
Total Protein: 7 g/dL (ref 6.5–8.1)

## 2020-09-10 LAB — URINALYSIS, COMPLETE (UACMP) WITH MICROSCOPIC
Bacteria, UA: NONE SEEN
Bilirubin Urine: NEGATIVE
Glucose, UA: NEGATIVE mg/dL
Ketones, ur: 5 mg/dL — AB
Leukocytes,Ua: NEGATIVE
Nitrite: NEGATIVE
Protein, ur: NEGATIVE mg/dL
Specific Gravity, Urine: 1.013 (ref 1.005–1.030)
Squamous Epithelial / HPF: NONE SEEN (ref 0–5)
pH: 6 (ref 5.0–8.0)

## 2020-09-10 LAB — APTT: aPTT: 31 seconds (ref 24–36)

## 2020-09-10 LAB — PROCALCITONIN: Procalcitonin: <0.1 ng/mL

## 2020-09-10 LAB — PROTIME-INR
INR: 1 (ref 0.8–1.2)
Prothrombin Time: 13.1 seconds (ref 11.4–15.2)

## 2020-09-10 LAB — LACTIC ACID, PLASMA: Lactic Acid, Venous: 2 mmol/L (ref 0.5–1.9)

## 2020-09-10 MED ORDER — ACETAMINOPHEN 500 MG PO TABS
1000.0000 mg | ORAL_TABLET | Freq: Four times a day (QID) | ORAL | Status: AC | PRN
  Administered 2020-09-13: 1000 mg via ORAL
  Filled 2020-09-10: qty 2

## 2020-09-10 MED ORDER — SODIUM CHLORIDE 0.9 % IV SOLN
500.0000 mg | INTRAVENOUS | Status: DC
Start: 2020-09-10 — End: 2020-09-10
  Administered 2020-09-10: 500 mg via INTRAVENOUS
  Filled 2020-09-10: qty 500

## 2020-09-10 MED ORDER — HYDRALAZINE HCL 10 MG PO TABS
10.0000 mg | ORAL_TABLET | Freq: Four times a day (QID) | ORAL | Status: DC | PRN
  Filled 2020-09-10: qty 1

## 2020-09-10 MED ORDER — ONDANSETRON HCL 4 MG/2ML IJ SOLN
4.0000 mg | Freq: Four times a day (QID) | INTRAMUSCULAR | Status: DC | PRN
  Filled 2020-09-10: qty 2

## 2020-09-10 MED ORDER — MAGNESIUM SULFATE 2 GM/50ML IV SOLN
2.0000 g | INTRAVENOUS | Status: AC
  Administered 2020-09-10: 2 g via INTRAVENOUS
  Filled 2020-09-10: qty 50

## 2020-09-10 MED ORDER — METHYLPREDNISOLONE SODIUM SUCC 40 MG IJ SOLR
40.0000 mg | Freq: Two times a day (BID) | INTRAMUSCULAR | Status: DC
  Administered 2020-09-10: 40 mg via INTRAVENOUS
  Filled 2020-09-10: qty 1

## 2020-09-10 MED ORDER — ACETAMINOPHEN 650 MG RE SUPP: 650.0000 mg | Freq: Four times a day (QID) | RECTAL | Status: DC | PRN

## 2020-09-10 MED ORDER — SODIUM CHLORIDE 0.9 % IV SOLN
2.0000 g | INTRAVENOUS | Status: DC
Start: 2020-09-10 — End: 2020-09-10
  Administered 2020-09-10: 2 g via INTRAVENOUS
  Filled 2020-09-10: qty 20

## 2020-09-10 MED ORDER — IOHEXOL 350 MG/ML SOLN
75.0000 mL | Freq: Once | INTRAVENOUS | Status: AC | PRN
  Administered 2020-09-12: 75 mL via INTRAVENOUS

## 2020-09-10 MED ORDER — LACTATED RINGERS IV SOLN: INTRAVENOUS | Status: DC

## 2020-09-10 MED ORDER — ENOXAPARIN SODIUM 40 MG/0.4ML IJ SOSY
40.0000 mg | PREFILLED_SYRINGE | INTRAMUSCULAR | Status: DC
  Administered 2020-09-10 – 2020-09-21 (×12): 40 mg via SUBCUTANEOUS
  Filled 2020-09-10 (×12): qty 0.4

## 2020-09-10 MED ORDER — ONDANSETRON HCL 4 MG PO TABS
4.0000 mg | ORAL_TABLET | Freq: Four times a day (QID) | ORAL | Status: DC | PRN
Start: 2020-09-10 — End: 2020-09-12

## 2020-09-10 MED ORDER — ALBUTEROL SULFATE HFA 108 (90 BASE) MCG/ACT IN AERS
2.0000 | INHALATION_SPRAY | Freq: Four times a day (QID) | RESPIRATORY_TRACT | Status: DC | PRN
  Filled 2020-09-10: qty 6.7

## 2020-09-10 MED ORDER — SODIUM CHLORIDE 0.9 % IV SOLN
500.0000 mg | INTRAVENOUS | Status: AC
  Administered 2020-09-11 – 2020-09-12 (×2): 500 mg via INTRAVENOUS
  Filled 2020-09-10 (×2): qty 500

## 2020-09-10 MED ORDER — PANTOPRAZOLE SODIUM 40 MG PO TBEC
80.0000 mg | DELAYED_RELEASE_TABLET | Freq: Every day | ORAL | Status: DC
  Administered 2020-09-11 – 2020-09-23 (×13): 80 mg via ORAL
  Filled 2020-09-10 (×13): qty 2

## 2020-09-10 MED ORDER — IPRATROPIUM-ALBUTEROL 0.5-2.5 (3) MG/3ML IN SOLN
3.0000 mL | Freq: Once | RESPIRATORY_TRACT | Status: AC
  Administered 2020-09-10: 3 mL via RESPIRATORY_TRACT
  Filled 2020-09-10: qty 3

## 2020-09-10 MED ORDER — ALBUTEROL SULFATE (2.5 MG/3ML) 0.083% IN NEBU
5.0000 mg | INHALATION_SOLUTION | Freq: Once | RESPIRATORY_TRACT | Status: AC
  Administered 2020-09-10: 5 mg via RESPIRATORY_TRACT
  Filled 2020-09-10: qty 6

## 2020-09-10 NOTE — Progress Notes (Signed)
Patient seen for bipap, flutter, and IS.  Patient not able to perform IS due to sob.  Flutter administered.  Loose non productive cough noted. Patient hesitant about bipap as he stated he felt like he was not getting enough air when used earlier. He was hoping nasal cannula with eliminate bipap use.  I explained his wob is high and such would be beneficial. Nasal cushion applied at bridge of nose. Bipap decreased to 10/5 as patient immediately stated 12/5 was taking his breath away.  Eventually settled into breathing with bipap however questionable if he will keep on

## 2020-09-10 NOTE — Plan of Care (Signed)

## 2020-09-10 NOTE — Progress Notes (Signed)
CODE SEPSIS - PHARMACY COMMUNICATION  **Broad Spectrum Antibiotics should be administered within 1 hour of Sepsis diagnosis**  Time Code Sepsis Called/Page Received: 1728  Antibiotics Ordered:  Ceftriaxone 2 g q24h Azithromycin 500 mg q24h  Time of 1st antibiotic administration: 1742    Raiford Noble ,PharmD Clinical Pharmacist  09/10/2020  5:49 PM

## 2020-09-10 NOTE — ED Triage Notes (Signed)
Pt arrives via EMS from home for diff breathing- pt has a hx of COPD and tested positive for covid on Tuesday- pt arrives on CPAP- pt normally on 2L [place]- pt was given 125 solumedrol by EMS

## 2020-09-10 NOTE — Progress Notes (Signed)
[facility] following for sepsis protocol. 

## 2020-09-10 NOTE — H&P (Addendum)
History and Physical   Charles Webster XBM:[id number] DOB: [birth year] DOA: 09/10/2020  PCP: Sofie Hartigan, MD  Outpatient Specialists: Dr. Mortimer Fries, pulmonologist Patient coming from: Home via EMS  I have personally briefly reviewed patient's old medical records in [place].  Chief Concern: Shortness of breath  HPI: Charles Webster is a 75 y.o. male with medical history significant for COPD, chronic O2 supplementation, GERD, presents emergency department for chief concerns of shortness of breath.  He endorses initially feeling the worsening shortness of breath started Sunday/Monday and worsened significantly today. Exertion causes the shortness of breath to worsen.   He endorses chest/epigastric pressure, constant and doesn't go away. He endorses diarrhea once today, three times on on 09/09/20, and he spent half a day on toilet from diarrhea 09/08/20.   He took his molnupiravir 200 mg capsules, 4 capsules PO, BID, for five days and he started on Tuesday, Wednesday, Thursday, Friday he took only 1/2 the dose. He took all doses this AM. He states he has few pills remaining.   Social history: He lives at home by himself. 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 He is a former tobacco user, and quit 2005. At his peak he was smoking 1 ppd. He denies recreational drug use   Vaccination history: He is vaccinated for covid 19, two doses of Pfizer.   ROS: Constitutional: no weight change, + fever ENT/Mouth: no sore throat, no rhinorrhea Eyes: no eye pain, no vision changes Cardiovascular: no chest pain, +_ dyspnea,  no edema, no palpitations Respiratory: no cough, no sputum, no wheezing Gastrointestinal: no nausea, no vomiting, no diarrhea, no constipation Genitourinary: no urinary incontinence, no dysuria, no hematuria Musculoskeletal: no arthralgias, no myalgias Skin: no skin lesions, no pruritus, Neuro: + weakness, no loss of consciousness, no  syncope Psych: no anxiety, no depression, + decrease appetite Heme/Lymph: no bruising, no bleeding  ED Course: Discussed with emergency medicine provider, patient requiring hospitalization for shortness of breath suspect COPD exacerbation in setting of COVID-positive.  Vitals in the emergency department was remarkable for temperature of 97.9, respiration rate of 19 and increased to 28, heart rate of 100, blood pressure 134/98, SPO2 of 99% on CPAP.  Labs in the emergency department was remarkable for sodium 134, potassium 4.2, chloride 97, bicarb 25, BUN 16, serum creatinine of 1.07, EGFR greater than 60, nonfasting blood glucose 118, WBC 9.2, hemoglobin 13.1, platelets 289, lactic acid 2.0, procalcitonin was negative.  Patient tested positive for COVID on 09/06/2020.  Patient was paxlovid outpatient.  Blood cultures x2, urine cultures have been collected and is in process.  In the emergency department patient was given albuterol, nebulizers, magnesium 2 g IV, lactated Ringer 150 mL/h, azithromycin 500 mg IV, ceftriaxone 2 g IV per EDP.  Per ED nurse note, patient received Solu-Medrol 125 mg per EMS.  Patient was placed on CPAP by EMS.  In the emergency department patient was started on BiPAP and is now improving.  Assessment/Plan  Principal Problem:   COPD exacerbation (HCC) Active Problems:   Hiatal hernia   Shortness of breath   COVID-19 virus infection   # COPD exacerbation in setting of COVID infection - Azithromycin 500 mg IV every 24 hours, 3 doses total for anti-inflammatory benefits - Solu-Medrol 40 mg IV every 12 hours started, albuterol 2 puff inhalation every 6 hours as needed for wheezing and shortness of breath - BiPAP nightly - Currently on 4 L nasal cannula and  using accessory muscles - CTA of the chest to assess for pulmonary embolism ordered - Admit to progressive cardiac, observation, telemetry - Airborne and contact precautions  # GERD-PPI  # Diarrhea-GI panel,  C. Difficile test - I suspect this is secondary to COVID-19 gastroenteritis  # Right upper extremity wound - POA, wound consulted  # A.m. team to complete med reconciliation CBC, BMP in the a.m.  Chart reviewed.   DVT prophylaxis: Enoxaparin 40 mg subcutaneous every 24 hours Code Status: full code  Diet: Heart healthy Family Communication: Charla, daughter at bedside  Disposition Plan: Pending clinical course Consults called: None at this time Admission status: Progressive cardiac, observation, telemetry  Past Medical History:  Diagnosis Date   Bronchitis    COPD (chronic obstructive pulmonary disease) ([place])    Diverticulitis    GERD (gastroesophageal reflux disease)    Hiatal hernia    Past Surgical History:  Procedure Laterality Date   LUNG REMOVAL, PARTIAL  2005   removed 20% of right lung   Social History:  reports that he quit smoking about 16 years ago. He has a 40.00 pack-year smoking history. He has never used smokeless tobacco. He reports that he does not drink alcohol and does not use drugs.  Allergies  Allergen Reactions   Alpha Blocker Quinazolines    Clindamycin/Lincomycin Nausea Only   Doxycycline Other (See Comments)   Levaquin [Levofloxacin]    Penicillins    Singulair [Montelukast Sodium] Other (See Comments)    Flu like symptoms   Sulfa Antibiotics Other (See Comments)   Tamsulosin Other (See Comments)   Family History  Problem Relation Age of Onset   Heart disease Mother    Kidney failure Father    Family history: Family history reviewed and not pertinent  Prior to Admission medications   Medication Sig Start Date End Date Taking? Authorizing Provider  albuterol (VENTOLIN HFA) 108 (90 Base) MCG/ACT inhaler Inhale 2 puffs into the lungs every 6 (six) hours as needed for wheezing or shortness of breath. 10/22/18   Flora Lipps, MD  Azelastine-Fluticasone (DYMISTA) 137-50 MCG/ACT SUSP Place 1 spray into the nose 2 (two) times daily. 11/21/17  12/21/17  Tyler Pita, MD  azithromycin (ZITHROMAX) 250 MG tablet Zpack taper as directed 09/05/20   Martyn Ehrich, NP  budesonide (PULMICORT) 0.5 MG/2ML nebulizer solution INHALE 1 VIAL TWICE A DAY 09/01/20   Flora Lipps, MD  cetirizine (ZYRTEC) 10 MG tablet Take 10 mg by mouth daily.    [provider]  fluconazole (DIFLUCAN) 100 MG tablet Take 1 tablet (100 mg total) by mouth daily. 10/22/18   Flora Lipps, MD  ipratropium-albuterol (DUONEB) 0.5-2.5 (3) MG/3ML SOLN TAKE 3 MLS BY NEBULIZATION EVERY 4 (FOUR) HOURS AS NEEDED. DX:J44.9 06/06/20   Flora Lipps, MD  montelukast (SINGULAIR) 10 MG tablet Take 1 tablet (10 mg total) by mouth daily. Patient not taking: Reported on 12/13/2017 11/21/17 12/21/17  Tyler Pita, MD  omeprazole (PRILOSEC) 40 MG capsule Take 40 mg by mouth daily.    [provider]  predniSONE (DELTASONE) 20 MG tablet Take 1 tablet (20 mg total) by mouth daily with breakfast. 10 days 06/01/20   Flora Lipps, MD  predniSONE (DELTASONE) 20 MG tablet TAKE 1 TABLET BY MOUTH EVERY DAY WITH BREAKFAST 08/18/20   Flora Lipps, MD  predniSONE (DELTASONE) 20 MG tablet Take 2 tablets (40 mg total) by mouth daily with breakfast for 5 days. 09/05/20 09/10/20  Martyn Ehrich, NP  RESTASIS 0.05 %  ophthalmic emulsion  05/26/20   [provider]  Spacer/Aero-Holding Chambers (AEROCHAMBER PLUS) inhaler Use as instructed 05/05/16   Melynda Ripple, MD   Physical Exam: Vitals:   09/10/20 1930 09/10/20 2000 09/10/20 2030 09/10/20 2100  BP: (!) 166/127 (!) 145/93 (!) 156/95 (!) 147/94  Pulse: 100 94 95 89  Resp: (!) 30 (!) 21 (!) 30 (!) 22  SpO2: 95% 99% 99% 98%  Weight:      Height:       Constitutional: appears age-appropriate, NAD, calm, comfortable Eyes: PERRL, lids and conjunctivae normal ENMT: Mucous membranes are moist. Posterior pharynx clear of any exudate or lesions. Age-appropriate dentition. Hearing appropriate Neck: normal, supple, no masses,  no thyromegaly Respiratory: Bilateral rhonchi, mild wheezing. Increased respiratory effort.  Increased accessory muscle use.  Cardiovascular: Regular rate and rhythm, no murmurs / rubs / gallops. No extremity edema. 2+ pedal pulses. No carotid bruits.  Abdomen: no tenderness, no masses palpated, no hepatosplenomegaly. Bowel sounds positive.  Musculoskeletal: no clubbing / cyanosis. No joint deformity upper and lower extremities. Good ROM, no contractures, no atrophy. Normal muscle tone.  Skin: + wound. No induration.   Neurologic: Sensation intact. Strength 5/5 in all 4.  Psychiatric: Normal judgment and insight. Alert and oriented x 3. Normal mood.   EKG: independently reviewed, showing sinus rhythm with rate of 98, QTc 447  Chest x-ray on Admission: I personally reviewed and I agree with radiologist reading as below.  DG Chest Port 1 View  Result Date: 09/10/2020 CLINICAL DATA:  Possible sepsis, history of COVID-19 positivity EXAM: PORTABLE CHEST 1 VIEW COMPARISON:  11/21/2017 FINDINGS: Cardiac shadow is within normal limits. The lungs are hyperinflated. Persistent scarring and volume loss is noted in the right upper lobe related to prior surgery and stable. Will rib fractures are noted on the right with healing. Mild atelectasis versus scarring is noted in the left base. No confluent focal infiltrate is seen. Aortic calcifications are noted. IMPRESSION: Chronic appearing changes. COPD without acute abnormality. Electronically Signed   By: Inez Catalina M.D.   On: 09/10/2020 17:55    Labs on Admission: I have personally reviewed following labs  CBC: Recent Labs  Lab 09/10/20 1728  WBC 9.2  NEUTROABS 8.2*  HGB 13.1  HCT 39.3  MCV 92.7  PLT 191   Basic Metabolic Panel: Recent Labs  Lab 09/10/20 1728  NA 134*  K 4.2  CL 97*  CO2 25  GLUCOSE 118*  BUN 16  CREATININE 1.07  CALCIUM 9.1   GFR: Estimated Creatinine Clearance: 48 mL/min (by C-G formula based on SCr of 1.07  mg/dL).  Liver Function Tests: Recent Labs  Lab 09/10/20 1728  AST 27  ALT 22  ALKPHOS 75  BILITOT 1.1  PROT 7.0  ALBUMIN 3.6   Coagulation Profile: Recent Labs  Lab 09/10/20 1728  INR 1.0   Urine analysis:    Component Value Date/Time   COLORURINE YELLOW (A) 09/10/2020 1829   APPEARANCEUR CLEAR (A) 09/10/2020 1829   APPEARANCEUR Clear 11/08/2012 1714   LABSPEC 1.013 09/10/2020 1829   LABSPEC 1.006 11/08/2012 1714   PHURINE 6.0 09/10/2020 1829   GLUCOSEU NEGATIVE 09/10/2020 1829   GLUCOSEU Negative 11/08/2012 1714   HGBUR MODERATE (A) 09/10/2020 1829   BILIRUBINUR NEGATIVE 09/10/2020 1829   BILIRUBINUR Negative 11/08/2012 1714   KETONESUR 5 (A) 09/10/2020 1829   PROTEINUR NEGATIVE 09/10/2020 1829   NITRITE NEGATIVE 09/10/2020 1829   LEUKOCYTESUR NEGATIVE 09/10/2020 1829   LEUKOCYTESUR Negative 11/08/2012 1714  Dr. Tobie Poet [facility]  If 7PM-7AM, please contact overnight-coverage provider If 7AM-7PM, please contact day coverage provider [email or web address]  09/10/2020, 9:39 PM

## 2020-09-10 NOTE — ED Provider Notes (Signed)
[facility] Emergency Department Provider Note  ____________________________________________  Time seen: Approximately 5:29 PM  I have reviewed the triage vital signs and the nursing notes.   HISTORY  Chief Complaint Respiratory Distress    HPI Charles Webster is a 75 y.o. male with a history of COPD on 2 L nasal cannula oxygen at all times at home who comes ED complaining of worsening shortness of breath and nonproductive cough over the past 2 days, now severe.  First responders noted him to have oxygen saturation of about 87% on his usual 2 L nasal cannula, requiring CPAP by EMS due to increased work of breathing and accessory muscle use.  Patient reports testing positive for COVID about 5 days ago.  He has been taking Paxlovid at home and initially felt like he was getting better but now getting worse.  Denies chest pain.  No syncope vomiting or fever.     09/06/20 positive covid test: VIROLOGY/[facility] 09/06/2020 Component    Influenza A PCR  Influenza B PCR  SARS-CoV2 PCR   Component 09/06/2020     Influenza A PCR Negative  Influenza B PCR Negative  SARS-CoV2 PCR Positive Abnormal       Past Medical History:  Diagnosis Date   Bronchitis    COPD (chronic obstructive pulmonary disease) (HCC)    Diverticulitis    GERD (gastroesophageal reflux disease)    Hiatal hernia      Patient Active Problem List   Diagnosis Date Noted   Shortness of breath 09/10/2020   COVID-19 virus infection 09/10/2020   COPD exacerbation (HCC) 05/05/2016   Allergic rhinitis 07/14/2013   Duodenitis 12/04/2012   Hiatal hernia 12/04/2012     Past Surgical History:  Procedure Laterality Date   LUNG REMOVAL, PARTIAL  2005   removed 20% of right lung     Prior to Admission medications   Medication Sig Start Date End Date Taking? Authorizing Provider  albuterol (VENTOLIN HFA) 108 (90 Base) MCG/ACT inhaler Inhale 2 puffs into the  lungs every 6 (six) hours as needed for wheezing or shortness of breath. 10/22/18   Erin Fulling, MD  Azelastine-Fluticasone (DYMISTA) 137-50 MCG/ACT SUSP Place 1 spray into the nose 2 (two) times daily. 11/21/17 12/21/17  Salena Saner, MD  azithromycin (ZITHROMAX) 250 MG tablet Zpack taper as directed 09/05/20   Glenford Bayley, NP  budesonide (PULMICORT) 0.5 MG/2ML nebulizer solution INHALE 1 VIAL TWICE A DAY 09/01/20   Erin Fulling, MD  cetirizine (ZYRTEC) 10 MG tablet Take 10 mg by mouth daily.    [provider]  fluconazole (DIFLUCAN) 100 MG tablet Take 1 tablet (100 mg total) by mouth daily. 10/22/18   Erin Fulling, MD  ipratropium-albuterol (DUONEB) 0.5-2.5 (3) MG/3ML SOLN TAKE 3 MLS BY NEBULIZATION EVERY 4 (FOUR) HOURS AS NEEDED. DX:J44.9 06/06/20   Erin Fulling, MD  LAGEVRIO 200 MG CAPS Take 4 capsules by mouth 2 (two) times daily. 09/06/20   [provider]  montelukast (SINGULAIR) 10 MG tablet Take 1 tablet (10 mg total) by mouth daily. Patient not taking: Reported on 12/13/2017 11/21/17 12/21/17  Salena Saner, MD  omeprazole (PRILOSEC) 40 MG capsule Take 40 mg by mouth daily.    [provider]  predniSONE (DELTASONE) 20 MG tablet Take 1 tablet (20 mg total) by mouth daily with breakfast. 10 days 06/01/20   Erin Fulling, MD  predniSONE (DELTASONE) 20 MG tablet TAKE 1 TABLET BY MOUTH EVERY DAY WITH BREAKFAST  08/18/20   Erin Fulling, MD  predniSONE (DELTASONE) 20 MG tablet Take 2 tablets (40 mg total) by mouth daily with breakfast for 5 days. 09/05/20 09/10/20  Glenford Bayley, NP  RESTASIS 0.05 % ophthalmic emulsion  05/26/20   [provider]  Spacer/Aero-Holding Chambers (AEROCHAMBER PLUS) inhaler Use as instructed 05/05/16   Domenick Gong, MD     Allergies Alpha blocker quinazolines, Clindamycin/lincomycin, Doxycycline, Levaquin [levofloxacin], Penicillins, Singulair [montelukast sodium], Sulfa antibiotics, and Tamsulosin   Family History   Problem Relation Age of Onset   Heart disease Mother    Kidney failure Father     Social History Social History   Tobacco Use   Smoking status: Former    Packs/day: 1.00    Years: 40.00    Pack years: 40.00    Types: Cigarettes    Quit date: 10/04/2003    Years since quitting: 16.9   Smokeless tobacco: Never  Vaping Use   Vaping Use: Never used  Substance Use Topics   Alcohol use: No   Drug use: No    Review of Systems  Constitutional:   No fever or chills.  ENT:   No sore throat. No rhinorrhea. Cardiovascular:   No chest pain or syncope. Respiratory: Positive shortness of breath and cough. Gastrointestinal:   Negative for abdominal pain, vomiting and diarrhea.  Musculoskeletal:   Negative for focal pain or swelling All other systems reviewed and are negative except as documented above in ROS and HPI.  ____________________________________________   PHYSICAL EXAM:  VITAL SIGNS: ED Triage Vitals  Enc Vitals Group     BP 09/10/20 1723 (!) 191/122     Pulse Rate 09/10/20 1723 (!) 103     Resp 09/10/20 1723 (!) 40     Temp --      Temp src --      SpO2 09/10/20 1723 96 %     Weight 09/10/20 1724 143 lb 4.8 oz (65 kg)     Height 09/10/20 1724 5\' 3"  (1.6 m)     Head Circumference --      Peak Flow --      Pain Score 09/10/20 1724 0     Pain Loc --      Pain Edu? --      Excl. in GC? --     Vital signs reviewed, nursing assessments reviewed.   Constitutional:   Alert and oriented.  Respiratory distress Eyes:   Conjunctivae are normal. EOMI. PERRL. ENT      Head:   Normocephalic and atraumatic.      Nose:   Normal.      Mouth/Throat:   Dry mucous membranes      Neck:   No meningismus. Full ROM. Hematological/Lymphatic/Immunilogical:   No cervical lymphadenopathy. Cardiovascular:   RRR. Symmetric bilateral radial and DP pulses.  No murmurs. Cap refill less than 2 seconds. Respiratory:   Normal respiratory effort without tachypnea/retractions. Breath sounds  are clear and equal bilaterally. No wheezes/rales/rhonchi. Gastrointestinal:   Soft and nontender. Non distended. There is no CVA tenderness.  No rebound, rigidity, or guarding. Genitourinary:   deferred Musculoskeletal:   Normal range of motion in all extremities. No joint effusions.  No lower extremity tenderness.  No edema. Neurologic:   Normal speech and language.  Motor grossly intact. No acute focal neurologic deficits are appreciated.  Skin:    Skin is warm, dry and intact. No rash noted.  No petechiae, purpura, or bullae.  ____________________________________________    LABS (  pertinent positives/negatives) (all labs ordered are listed, but only abnormal results are displayed) Labs Reviewed  LACTIC ACID, PLASMA - Abnormal; Notable for the following components:      Result Value   Lactic Acid, Venous 2.0 (*)    All other components within normal limits  COMPREHENSIVE METABOLIC PANEL - Abnormal; Notable for the following components:   Sodium 134 (*)    Chloride 97 (*)    Glucose, Bld 118 (*)    All other components within normal limits  CBC WITH DIFFERENTIAL/PLATELET - Abnormal; Notable for the following components:   Neutro Abs 8.2 (*)    Lymphs Abs 0.5 (*)    All other components within normal limits  URINALYSIS, COMPLETE (UACMP) WITH MICROSCOPIC - Abnormal; Notable for the following components:   Color, Urine YELLOW (*)    APPearance CLEAR (*)    Hgb urine dipstick MODERATE (*)    Ketones, ur 5 (*)    All other components within normal limits  CULTURE, BLOOD (ROUTINE X 2)  CULTURE, BLOOD (ROUTINE X 2)  URINE CULTURE  PROTIME-INR  APTT  PROCALCITONIN  LACTIC ACID, PLASMA  BASIC METABOLIC PANEL  CBC   ____________________________________________   EKG  Interpreted by me Sinus rhythm rate of 98, normal axis, normal intervals.  Normal QRS ST segments and T waves.  ____________________________________________    RADIOLOGY  DG Chest Port 1 View  Result  Date: 09/10/2020 CLINICAL DATA:  Possible sepsis, history of COVID-19 positivity EXAM: PORTABLE CHEST 1 VIEW COMPARISON:  11/21/2017 FINDINGS: Cardiac shadow is within normal limits. The lungs are hyperinflated. Persistent scarring and volume loss is noted in the right upper lobe related to prior surgery and stable. Will rib fractures are noted on the right with healing. Mild atelectasis versus scarring is noted in the left base. No confluent focal infiltrate is seen. Aortic calcifications are noted. IMPRESSION: Chronic appearing changes. COPD without acute abnormality. Electronically Signed   By: Alcide CleverMark  Lukens M.D.   On: 09/10/2020 17:55    ____________________________________________   PROCEDURES .Critical Care  Date/Time: 09/10/2020 5:33 PM Performed by: Sharman CheekStafford, Fajr Fife, MD Authorized by: Sharman CheekStafford, Edvardo Honse, MD   Critical care provider statement:    Critical care time (minutes):  35   Critical care time was exclusive of:  Separately billable procedures and treating other patients   Critical care was necessary to treat or prevent imminent or life-threatening deterioration of the following conditions:  Sepsis and respiratory failure   Critical care was time spent personally by me on the following activities:  Development of treatment plan with patient or surrogate, discussions with consultants, evaluation of patient's response to treatment, examination of patient, obtaining history from patient or surrogate, ordering and performing treatments and interventions, ordering and review of laboratory studies, ordering and review of radiographic studies, pulse oximetry, re-evaluation of patient's condition and review of old charts  ____________________________________________  DIFFERENTIAL DIAGNOSIS   Pneumonia, COVID-19 pneumonitis, pneumothorax, COPD exacerbation  CLINICAL IMPRESSION / ASSESSMENT AND PLAN / ED COURSE  Medications ordered in the ED: Medications  pantoprazole (PROTONIX) EC  tablet 80 mg (has no administration in time range)  albuterol (VENTOLIN HFA) 108 (90 Base) MCG/ACT inhaler 2 puff (has no administration in time range)  acetaminophen (TYLENOL) tablet 1,000 mg (has no administration in time range)    Or  acetaminophen (TYLENOL) suppository 650 mg (has no administration in time range)  ondansetron (ZOFRAN) tablet 4 mg (has no administration in time range)    Or  ondansetron (ZOFRAN) injection 4  mg (has no administration in time range)  enoxaparin (LOVENOX) injection 40 mg (has no administration in time range)  azithromycin (ZITHROMAX) 500 mg in sodium chloride 0.9 % 250 mL IVPB (has no administration in time range)  hydrALAZINE (APRESOLINE) tablet 10 mg (has no administration in time range)  methylPREDNISolone sodium succinate (SOLU-MEDROL) 40 mg/mL injection 40 mg (has no administration in time range)  lactated ringers infusion (has no administration in time range)  magnesium sulfate IVPB 2 g 50 mL (0 g Intravenous Stopped 09/10/20 2007)  ipratropium-albuterol (DUONEB) 0.5-2.5 (3) MG/3ML nebulizer solution 3 mL (3 mLs Nebulization Given by Other 09/10/20 1730)  albuterol (PROVENTIL) (2.5 MG/3ML) 0.083% nebulizer solution 5 mg (5 mg Nebulization Given by Other 09/10/20 1730)    Pertinent labs & imaging results that were available during my care of the patient were reviewed by me and considered in my medical decision making (see chart for details).  BERNARD SLAYDEN was evaluated in Emergency Department on 09/10/2020 for the symptoms described in the history of present illness. He was evaluated in the context of the global COVID-19 pandemic, which necessitated consideration that the patient might be at risk for infection with the SARS-CoV-2 virus that causes COVID-19. Institutional protocols and algorithms that pertain to the evaluation of patients at risk for COVID-19 are in a state of rapid change based on information released by regulatory bodies including the CDC and  federal and state organizations. These policies and algorithms were followed during the patient's care in the ED.   Patient presents with respiratory distress, likely COPD exacerbation in the setting of COVID infection causing acute on chronic respiratory failure with hypoxia.  Will initiate sepsis work-up, give ceftriaxone and azithromycin, IV fluids for hydration, BiPAP for respiratory support.  Received Solu-Medrol by EMS, will give magnesium and bronchodilators as well.  Patient will need to be admitted.  Doubt ACS dissection or PE  ----------------------------------------- 9:46 PM on 09/10/2020 ----------------------------------------- Improved after BiPAP bronchodilators magnesium and steroids.  Sepsis work-up is negative, clear chest x-ray, procalcitonin negative.  Can discontinue code sepsis.  Vital signs improved.  Lactate 2.0, no signs of shock.  Able to wean off of the BiPAP.      ____________________________________________   FINAL CLINICAL IMPRESSION(S) / ED DIAGNOSES    Final diagnoses:  Acute on chronic respiratory failure with hypoxia (HCC)  COVID-19 virus infection     ED Discharge Orders     None       Portions of this note were generated with dragon dictation software. Dictation errors may occur despite best attempts at proofreading.    Sharman Cheek, MD 09/10/20 2147

## 2020-09-10 NOTE — ED Notes (Signed)
md alerted to critical lactate from 1758 called at 2058.

## 2020-09-11 ENCOUNTER — Observation Stay: Payer: Medicare Other

## 2020-09-11 ENCOUNTER — Inpatient Hospital Stay (HOSPITAL_COMMUNITY)
Admit: 2020-09-11 | Discharge: 2020-09-11 | Disposition: A | Discharge status: Home / Self Care | Payer: Medicare Other | Attending: Internal Medicine | Admitting: Internal Medicine

## 2020-09-11 DIAGNOSIS — Z8616 Personal history of COVID-19: Secondary | ICD-10-CM | POA: Diagnosis not present

## 2020-09-11 DIAGNOSIS — Z79899 Other long term (current) drug therapy: Secondary | ICD-10-CM | POA: Diagnosis not present

## 2020-09-11 DIAGNOSIS — R0602 Shortness of breath: Secondary | ICD-10-CM | POA: Diagnosis present

## 2020-09-11 DIAGNOSIS — A0472 Enterocolitis due to Clostridium difficile, not specified as recurrent: Secondary | ICD-10-CM | POA: Diagnosis present

## 2020-09-11 DIAGNOSIS — I5031 Acute diastolic (congestive) heart failure: Secondary | ICD-10-CM | POA: Diagnosis not present

## 2020-09-11 DIAGNOSIS — J9621 Acute and chronic respiratory failure with hypoxia: Secondary | ICD-10-CM | POA: Diagnosis present

## 2020-09-11 DIAGNOSIS — Z882 Allergy status to sulfonamides status: Secondary | ICD-10-CM | POA: Diagnosis not present

## 2020-09-11 DIAGNOSIS — R54 Age-related physical debility: Secondary | ICD-10-CM | POA: Diagnosis present

## 2020-09-11 DIAGNOSIS — Z9889 Other specified postprocedural states: Secondary | ICD-10-CM | POA: Diagnosis not present

## 2020-09-11 DIAGNOSIS — Z880 Allergy status to penicillin: Secondary | ICD-10-CM | POA: Diagnosis not present

## 2020-09-11 DIAGNOSIS — S51811A Laceration without foreign body of right forearm, initial encounter: Secondary | ICD-10-CM | POA: Diagnosis present

## 2020-09-11 DIAGNOSIS — J441 Chronic obstructive pulmonary disease with (acute) exacerbation: Secondary | ICD-10-CM | POA: Diagnosis present

## 2020-09-11 DIAGNOSIS — R531 Weakness: Secondary | ICD-10-CM | POA: Diagnosis not present

## 2020-09-11 DIAGNOSIS — Z66 Do not resuscitate: Secondary | ICD-10-CM | POA: Diagnosis not present

## 2020-09-11 DIAGNOSIS — J984 Other disorders of lung: Secondary | ICD-10-CM | POA: Diagnosis not present

## 2020-09-11 DIAGNOSIS — Z902 Acquired absence of lung [part of]: Secondary | ICD-10-CM | POA: Diagnosis not present

## 2020-09-11 DIAGNOSIS — F32A Depression, unspecified: Secondary | ICD-10-CM | POA: Diagnosis present

## 2020-09-11 DIAGNOSIS — R0603 Acute respiratory distress: Secondary | ICD-10-CM | POA: Diagnosis not present

## 2020-09-11 DIAGNOSIS — K219 Gastro-esophageal reflux disease without esophagitis: Secondary | ICD-10-CM | POA: Diagnosis present

## 2020-09-11 DIAGNOSIS — Z7951 Long term (current) use of inhaled steroids: Secondary | ICD-10-CM | POA: Diagnosis not present

## 2020-09-11 DIAGNOSIS — K449 Diaphragmatic hernia without obstruction or gangrene: Secondary | ICD-10-CM | POA: Diagnosis present

## 2020-09-11 DIAGNOSIS — Z87891 Personal history of nicotine dependence: Secondary | ICD-10-CM | POA: Diagnosis not present

## 2020-09-11 DIAGNOSIS — K649 Unspecified hemorrhoids: Secondary | ICD-10-CM | POA: Diagnosis present

## 2020-09-11 DIAGNOSIS — Z881 Allergy status to other antibiotic agents status: Secondary | ICD-10-CM | POA: Diagnosis not present

## 2020-09-11 DIAGNOSIS — I472 Ventricular tachycardia: Secondary | ICD-10-CM | POA: Diagnosis present

## 2020-09-11 DIAGNOSIS — Z9981 Dependence on supplemental oxygen: Secondary | ICD-10-CM | POA: Diagnosis not present

## 2020-09-11 DIAGNOSIS — I110 Hypertensive heart disease with heart failure: Secondary | ICD-10-CM | POA: Diagnosis present

## 2020-09-11 DIAGNOSIS — Z515 Encounter for palliative care: Secondary | ICD-10-CM | POA: Diagnosis not present

## 2020-09-11 DIAGNOSIS — I5033 Acute on chronic diastolic (congestive) heart failure: Secondary | ICD-10-CM | POA: Diagnosis present

## 2020-09-11 DIAGNOSIS — B370 Candidal stomatitis: Secondary | ICD-10-CM | POA: Diagnosis present

## 2020-09-11 DIAGNOSIS — F419 Anxiety disorder, unspecified: Secondary | ICD-10-CM | POA: Diagnosis present

## 2020-09-11 LAB — BASIC METABOLIC PANEL
Anion gap: 10 (ref 5–15)
BUN: 16 mg/dL (ref 8–23)
CO2: 28 mmol/L (ref 22–32)
Calcium: 8.8 mg/dL — ABNORMAL LOW (ref 8.9–10.3)
Chloride: 98 mmol/L (ref 98–111)
Creatinine, Ser: 0.95 mg/dL (ref 0.61–1.24)
GFR, Estimated: >60 mL/min (ref >60)
Glucose, Bld: 117 mg/dL — ABNORMAL HIGH (ref 70–99)
Potassium: 4.4 mmol/L (ref 3.5–5.1)
Sodium: 136 mmol/L (ref 135–145)

## 2020-09-11 LAB — CBC
HCT: 36.4 % — ABNORMAL LOW (ref 39.0–52.0)
Hemoglobin: 12.4 g/dL — ABNORMAL LOW (ref 13.0–17.0)
MCH: 30.5 pg (ref 26.0–34.0)
MCHC: 34.1 g/dL (ref 30.0–36.0)
MCV: 89.7 fL (ref 80.0–100.0)
Platelets: 256 10*3/uL (ref 150–400)
RBC: 4.06 MIL/uL — ABNORMAL LOW (ref 4.22–5.81)
RDW: 14.2 % (ref 11.5–15.5)
WBC: 11.8 10*3/uL — ABNORMAL HIGH (ref 4.0–10.5)
nRBC: 0 % (ref 0.0–0.2)

## 2020-09-11 LAB — BLOOD GAS, ARTERIAL
Acid-Base Excess: 4.2 mmol/L — ABNORMAL HIGH (ref 0.0–2.0)
Allens test (pass/fail): POSITIVE — AB
Bicarbonate: 28.4 mmol/L — ABNORMAL HIGH (ref 20.0–28.0)
Delivery systems: POSITIVE
Expiratory PAP: 5
FIO2: 0.4
Inspiratory PAP: 10
O2 Saturation: 99.3 %
Patient temperature: 37
RATE: 12 resp/min
pCO2 arterial: 40 mmHg (ref 32.0–48.0)
pH, Arterial: 7.46 — ABNORMAL HIGH (ref 7.350–7.450)
pO2, Arterial: 140 mmHg — ABNORMAL HIGH (ref 83.0–108.0)

## 2020-09-11 LAB — BRAIN NATRIURETIC PEPTIDE: B Natriuretic Peptide: 212 pg/mL — ABNORMAL HIGH (ref 0.0–100.0)

## 2020-09-11 LAB — LACTIC ACID, PLASMA: Lactic Acid, Venous: 2.7 mmol/L (ref 0.5–1.9)

## 2020-09-11 LAB — C-REACTIVE PROTEIN: CRP: 9.1 mg/dL — ABNORMAL HIGH (ref <1.0)

## 2020-09-11 LAB — STREP PNEUMONIAE URINARY ANTIGEN: Strep Pneumo Urinary Antigen: NEGATIVE

## 2020-09-11 LAB — D-DIMER, QUANTITATIVE: D-Dimer, Quant: 0.44 ug/mL-FEU (ref 0.00–0.50)

## 2020-09-11 LAB — MAGNESIUM: Magnesium: 2.4 mg/dL (ref 1.7–2.4)

## 2020-09-11 LAB — MRSA NEXT GEN BY PCR, NASAL: MRSA by PCR Next Gen: DETECTED — AB

## 2020-09-11 MED ORDER — METHYLPREDNISOLONE SODIUM SUCC 125 MG IJ SOLR
80.0000 mg | Freq: Two times a day (BID) | INTRAMUSCULAR | Status: DC
  Administered 2020-09-11: 80 mg via INTRAVENOUS
  Filled 2020-09-11: qty 2

## 2020-09-11 MED ORDER — METHYLPREDNISOLONE SODIUM SUCC 40 MG IJ SOLR
40.0000 mg | Freq: Two times a day (BID) | INTRAMUSCULAR | Status: DC
  Administered 2020-09-11 – 2020-09-17 (×12): 40 mg via INTRAVENOUS
  Filled 2020-09-11 (×13): qty 1

## 2020-09-11 MED ORDER — MOMETASONE FURO-FORMOTEROL FUM 200-5 MCG/ACT IN AERO
2.0000 | INHALATION_SPRAY | Freq: Two times a day (BID) | RESPIRATORY_TRACT | Status: DC
  Administered 2020-09-11 – 2020-09-22 (×23): 2 via RESPIRATORY_TRACT
  Filled 2020-09-11: qty 8.8

## 2020-09-11 MED ORDER — PERFLUTREN LIPID MICROSPHERE
1.0000 mL | INTRAVENOUS | Status: AC | PRN
  Administered 2020-09-11: 3 mL via INTRAVENOUS
  Filled 2020-09-11: qty 10

## 2020-09-11 MED ORDER — IPRATROPIUM BROMIDE HFA 17 MCG/ACT IN AERS
2.0000 | INHALATION_SPRAY | Freq: Four times a day (QID) | RESPIRATORY_TRACT | Status: DC
  Administered 2020-09-11 – 2020-09-22 (×44): 2 via RESPIRATORY_TRACT
  Filled 2020-09-11: qty 12.9

## 2020-09-11 MED ORDER — MUPIROCIN 2 % EX OINT
TOPICAL_OINTMENT | Freq: Two times a day (BID) | CUTANEOUS | Status: DC
  Administered 2020-09-11 – 2020-09-12 (×3): 1 via NASAL
  Filled 2020-09-11 (×2): qty 22

## 2020-09-11 MED ORDER — FUROSEMIDE 10 MG/ML IJ SOLN
40.0000 mg | Freq: Once | INTRAMUSCULAR | Status: AC
  Administered 2020-09-11: 40 mg via INTRAVENOUS
  Filled 2020-09-11: qty 4

## 2020-09-11 MED ORDER — AMLODIPINE BESYLATE 10 MG PO TABS
10.0000 mg | ORAL_TABLET | Freq: Every day | ORAL | Status: DC
  Administered 2020-09-11 – 2020-09-23 (×13): 10 mg via ORAL
  Filled 2020-09-11 (×13): qty 1

## 2020-09-11 MED ORDER — CHLORHEXIDINE GLUCONATE CLOTH 2 % EX PADS
6.0000 | MEDICATED_PAD | Freq: Every day | CUTANEOUS | Status: DC
  Administered 2020-09-11 – 2020-09-13 (×2): 6 via TOPICAL

## 2020-09-11 NOTE — Sepsis Progress Note (Signed)
[facility] Code Sepsis Completion Note:  Pt LA trended up. Had ABX and gentle hydration, pt has COPD. Approx 800 ml IVF replacement. Admitted to progressive/cardiac floor for continued monitoring.  Sunny Schlein Abdoulaye Drum DNP Pola Corn RN  [phone] PM

## 2020-09-11 NOTE — Progress Notes (Signed)
*  PRELIMINARY RESULTS* Echocardiogram 2D Echocardiogram has been performed. Definity IV Contrast used on this study.  Lenor Coffin 09/11/2020, 12:58 PM

## 2020-09-11 NOTE — Consult Note (Signed)
NAME:  Charles Webster, MRN:  [id number], DOB:  1946/01/22, LOS: 0 ADMISSION DATE:  09/10/2020, CONSULTATION DATE:  09/11/2020 REFERRING MD: Susa Raring MD CHIEF COMPLAINT:  SOB    HPI  75 y.o with significant PMH of COPD on chronic home oxygen at 2L, Bronchitis, Diverticulitis, GERD and Hiatal hernia who presented to the ED with chief complaints of progressive shortness of breath.  Patient apparently tested positive for COVID on 09/06/2020 and was being treated at home with Paxlovid. He initially felt better but symptoms worsened with associated nonproductive cough x 2 days prompting him to come to the ED for further evaluation.  ED Course: On arrival to the ED, he was afebrile with blood pressure 191/122 mm Hg and pulse rate  103 beats/min, RR 40, Sats 96% on CPAP. There were no focal neurological deficits; he was alert and oriented x4, but visibly short of breath. Initial labs/Diagnostics revealed: WBC/Hgb/Plts:9.2/13.1/289 Na+/K+/Cl/Bicarb/BUN/Cr/Glucose: 134/4.2/97/25/16/1.27/118 EKG: showing sinus rhythm with rate of 98, QTc 447 CXR: Bibasilar mild airspace disease suggests pulmonary edema. UA: negtive Lactate: 2.0 Patient received Solu-Medrol by EMS, He also received  magnesium, bronchodilators  and was started on ceftriaxone and azithromycin, IV fluids for hydration, and placed on BiPAP for respiratory support. Sepsis work-up was negative, clear chest x-ray, procalcitonin negative with no signs of shock.  Patient was able to be weaned off of the BiPAP and admitted under hospitalist service.  Hospital Course: During the course of his admission, patient remained on BiPAP support with minimal improvement. This morning he was noted with increased work of breathing despite diuresis and BiPAP support. Due to concerns for clinical deterioration and possible requiring mechanical ventilation if he fails BiPAP, patient was transferred to the ICU and PCCM consulted. Labs obtained as  follows: Labs/Diagnostics WBC/Hgb/Hct/Plts:  11.8/12.4/36.4/256 (08/14 8127)  Arterial Blood Gas result:  pO2 140; pCO2 40; pH 7.46;  HCO3 28.4, %O2 Sat 99.3 Imaging / Other Labs: CRP 9.1, D-dimer 0.44, BNP 212,  Past Medical History    Bronchitis     COPD (chronic obstructive pulmonary disease) (HCC)     Diverticulitis     GERD (gastroesophageal reflux disease)     Hiatal hernia     Significant Hospital Events   COPD on chronic home oxygen at 2L Bronchitis Diverticulitis GERD  Hiatal hernia  Consults:  PCCM  Procedures:    Significant Diagnostic Tests:  8/14: Chest Xray>Bibasilar mild airspace disease suggests pulmonary edema. Postsurgical volume loss in the RIGHT hemithorax 8/14: CTA Chest> pending  Micro Data:   Influenza A PCR  Negative  Influenza B PCR Negative  SARS-CoV2 PCR Positive Abnormal     8/13: Blood culture x2> 8/13: Urine Culture> 8/13: MRSA PCR>>  8/14: Strep pneumo urinary antigen> 8/14: Legionella urinary antigen>  Antimicrobials:  Azithromycin 8/13> Ceftriaxone 8/14>  OBJECTIVE  Blood pressure (!) 161/112, pulse 86, temperature 98.2 F (36.8 C), resp. rate (!) 23, height 5\' 3"  (1.6 m), weight 65 kg, SpO2 99 %.        Intake/Output Summary (Last 24 hours) at 09/11/2020 1030 Last data filed at 09/11/2020 0334 Gross per 24 hour  Intake 686.22 ml  Output --  Net 686.22 ml   Filed Weights   09/10/20 1724  Weight: 65 kg   Physical Examination  GENERAL:AGE year-old critically ill patient lying in the bed on BiPAP support EYES: Pupils equal, round, reactive to light and accommodation. No scleral icterus. Extraocular muscles intact.  HEENT: Head atraumatic, normocephalic. Oropharynx and nasopharynx clear.  NECK:  Supple, no jugular venous distention. No thyroid enlargement, no tenderness.  LUNGS: Abnormal breath sounds bilaterally, mild wheezing, rales, No rhonchi or crepitation. Mild use of accessory muscles of respiration.   CARDIOVASCULAR: S1, S2 normal. No murmurs, rubs, or gallops.  ABDOMEN: Soft, nontender, nondistended. Bowel sounds present. No organomegaly or mass.  EXTREMITIES: No pedal edema, cyanosis, or clubbing.  NEUROLOGIC: Cranial nerves II through XII are intact.  Muscle strength 5/5 in all extremities. Sensation intact. Gait not checked.  PSYCHIATRIC: The patient is on BiPAP SKIN:    Labs/imaging that I havepersonally reviewed  (right click and "Reselect all SmartList Selections" daily)     Labs   CBC: Recent Labs  Lab 09/10/20 1728 09/11/20 0616  WBC 9.2 11.8*  NEUTROABS 8.2*  --   HGB 13.1 12.4*  HCT 39.3 36.4*  MCV 92.7 89.7  PLT 289 256    Basic Metabolic Panel: Recent Labs  Lab 09/10/20 1728 09/11/20 0616  NA 134* 136  K 4.2 4.4  CL 97* 98  CO2 25 28  GLUCOSE 118* 117*  BUN 16 16  CREATININE 1.07 0.95  CALCIUM 9.1 8.8*  MG  --  2.4   GFR: Estimated Creatinine Clearance: 54.1 mL/min (by C-G formula based on SCr of 0.95 mg/dL). Recent Labs  Lab 09/10/20 1728 09/10/20 2315 09/11/20 0616  PROCALCITON <0.10  --   --   WBC 9.2  --  11.8*  LATICACIDVEN 2.0* 2.7*  --     Liver Function Tests: Recent Labs  Lab 09/10/20 1728  AST 27  ALT 22  ALKPHOS 75  BILITOT 1.1  PROT 7.0  ALBUMIN 3.6   No results for input(s): LIPASE, AMYLASE in the last 168 hours. No results for input(s): AMMONIA in the last 168 hours.  ABG    Component Value Date/Time   PHART 7.46 (H) 09/11/2020 0818   PCO2ART 40 09/11/2020 0818   PO2ART 140 (H) 09/11/2020 0818   HCO3 28.4 (H) 09/11/2020 0818   O2SAT 99.3 09/11/2020 0818     Coagulation Profile: Recent Labs  Lab 09/10/20 1728  INR 1.0    Cardiac Enzymes: No results for input(s): CKTOTAL, CKMB, CKMBINDEX, TROPONINI in the last 168 hours.  HbA1C: No results found for: HGBA1C  CBG: No results for input(s): GLUCAP in the last 168 hours.  Review of Systems:   UNABLE TO OBTAIN DUE TO PATIENT BEING ON BIPAP  Past  Medical History  He,  has a past medical history of Bronchitis, COPD (chronic obstructive pulmonary disease) (HCC), Diverticulitis, GERD (gastroesophageal reflux disease), and Hiatal hernia.   Surgical History    Past Surgical History:  Procedure Laterality Date   LUNG REMOVAL, PARTIAL  2005   removed 20% of right lung     Social History   reports that he quit smoking about 16 years ago. He has a 40.00 pack-year smoking history. He has never used smokeless tobacco. He reports that he does not drink alcohol and does not use drugs.   Family History   His family history includes Heart disease in his mother; Kidney failure in his father.   Allergies Allergies  Allergen Reactions   Alpha Blocker Quinazolines    Clindamycin/Lincomycin Nausea Only   Doxycycline Other (See Comments)   Levaquin [Levofloxacin]    Penicillins    Singulair [Montelukast Sodium] Other (See Comments)    Flu like symptoms   Sulfa Antibiotics Other (See Comments)   Tamsulosin Other (See Comments)     Home Medications  Prior to Admission medications   Medication Sig Start Date End Date Taking? Authorizing Provider  albuterol (VENTOLIN HFA) 108 (90 Base) MCG/ACT inhaler Inhale 2 puffs into the lungs every 6 (six) hours as needed for wheezing or shortness of breath. 10/22/18  Yes Kasa, Wallis Bamberg, MD  budesonide (PULMICORT) 0.5 MG/2ML nebulizer solution INHALE 1 VIAL TWICE A DAY 09/01/20  Yes Kasa, Kurian, MD  ipratropium-albuterol (DUONEB) 0.5-2.5 (3) MG/3ML SOLN TAKE 3 MLS BY NEBULIZATION EVERY 4 (FOUR) HOURS AS NEEDED. DX:J44.9 06/06/20  Yes Kasa, Wallis Bamberg, MD  LAGEVRIO 200 MG CAPS Take 4 capsules by mouth 2 (two) times daily. 09/06/20  Yes [provider]  omeprazole (PRILOSEC) 40 MG capsule Take 40 mg by mouth daily.   Yes [provider]  RESTASIS 0.05 % ophthalmic emulsion  05/26/20  Yes [provider]  Scheduled Meds:  amLODipine  10 mg Oral Daily   Chlorhexidine Gluconate Cloth  6 each  Topical Q0600   enoxaparin (LOVENOX) injection  40 mg Subcutaneous Q24H   ipratropium  2 puff Inhalation Q6H   methylPREDNISolone (SOLU-MEDROL) injection  40 mg Intravenous Q12H   mometasone-formoterol  2 puff Inhalation BID   pantoprazole  80 mg Oral Daily   Continuous Infusions:  azithromycin     PRN Meds:.acetaminophen **OR** acetaminophen, albuterol, hydrALAZINE, iohexol, ondansetron **OR** ondansetron (ZOFRAN) IV  ASSESSMENT & PLAN  Acute on Chronic Hypoxic Respiratory Failure secondary to COPD exacerbation & COVID Infection & Possible Pulmonary Edema PMHx: COPD -Supplemental O2 as needed to maintain O2 saturations 88 to 92% -BiPAP, wean as tolerated -High risk for intubation -Follow intermittent ABG and chest x-ray as needed -Daily WUA with SBT as tolerated  -Ensure adequate pulmonary hygiene  -F/u cultures, trend PCT, -Check Strep and Legionella antigen -Continue CAP Pna coverage: Azithromycin/will add Ceftriaxone -Repeat CXR on 8/14 with Bibasilar mild airspace disease suggests pulmonary edema.; no new focal infiltrate or hyperinflation noted; currently mild wheezing noted upon auscultation -IV Lasix as blood pressure and renal function permits -Scheduled and PRN bronchodilators -Methylprednisolone IV 40 mg BID -Repeat 2D Echocardiogram   COVID-19 Infection -Will obtain CT chest eval for PE -Continue Empiric abx coverage given risk factors (COPD) with Ceftriaxone + Azithromycin pending cultures, may broaden if appropriate -Supplemental O2 as needed to maintain O2 saturations 88 to 92% -Follow intermittent ABG and chest x-ray as needed -As needed bronchodilators -Treated at home with Plax -Continue steroids. -Encourage OOB, IS, FV, and awake proning if able -GI panel pending, suspect diarrhea maybe due to COVID gastroenteritis? -Monitor CMP and inflammatory markers -Heparin prophylactic dose.    Best practice:  Diet:  NPO Pain/Anxiety/Delirium protocol (if  indicated): No VAP protocol (if indicated): Not indicated DVT prophylaxis: LMWH GI prophylaxis: PPI Glucose control:  SSI No Central venous access:  N/A Arterial line:  N/A Foley:  N/A Mobility:  bed rest  PT consulted: N/A Last date of multidisciplinary goals of care discussion [8/14] Code Status:  full code Disposition: Stepdown   = Goals of Care = Code Status Order: FULL  Primary Emergency Contact: Maness,Charla Wishes to pursue full aggressive treatment and intervention options, including CPR and intubation,  goals of care will be addressed on going with family if that should become necessary.  Critical care time: 45 minutes     Webb Silversmith, DNP, CCRN, FNP-C, AGACNP-BC Acute Care Nurse Practitioner  [facility] Pager: [phone] [facility]  .

## 2020-09-11 NOTE — Progress Notes (Signed)
Patient followup. Wanted to trial off bipap since having to get up a lot for urine relief. Placed back on nasal o2 only to find wob increased and patient immediately wanted to go back on. Bipap remains in use

## 2020-09-11 NOTE — Consult Note (Addendum)
WOC Nurse Consult Note: Reason for Consult:Right forearm skin tear (POA) with surrounding ecchymosis Wound type: trauma Pressure Injury POA: N/A Measurement:To be obtained by Bedside RN Harvie Heck prior to placement of first dressing today. Wound bed: See photo for additional information. Skin tear with congealed blood in a small amount, dried serum and surrounding ecchymosis. Drainage (amount, consistency, odor) None Periwound: with evidence of previous injuries, fragile tissue Dressing procedure/placement/frequency: I have provided Nursing with guidance for the care of this area of tissue trauma using NS to cleanse and then covering with an antimicrobial nonadherent, xeroform. The dressing is to be applied folded so as not to dry out.  Securement is to be with a few turns of Kerlix roll gauze and paper tape applied to the Kerlix so no tape is applied to skin.  I have provided bilateral Prevalon Boots and requested a prophylactic foam to the sacrum for PI prevention. Additionally, a pressure redistribution chair cushion is provided for times when OOB to chair and for use at home.  WOC nursing team will not follow, but will remain available to this patient, the nursing and medical teams.  Please re-consult if needed. Thanks, Ladona Mow, MSN, RN, GNP, [facility]  Pager# [phone]

## 2020-09-11 NOTE — Progress Notes (Signed)
PROGRESS NOTE                                                                                                                                                                                                             Patient Demographics:    Charles Webster, is a 75 y.o. male, DOB - 12/04/1945, UXL:[id number]  Outpatient Primary MD for the patient is Feldpausch, Madaline Guthrie, MD    LOS - 0  Admit date - 09/10/2020    Chief Complaint  Patient presents with   Respiratory Distress       Brief Narrative (HPI from H&P)    Charles Webster is a 75 y.o. male with medical history significant for COPD, chronic O2 supplementation, GERD, presents emergency department for chief concerns of shortness of breath.  ER he was diagnosed with acute hypoxic respiratory failure due to COPD exacerbation, he was placed on BiPAP and admitted to the hospital.   Subjective:    Charles Webster today has, No headache, No chest pain, No abdominal pain - No Nausea, No new weakness tingling or numbness, ++ SOB   Assessment  & Plan :     Acute Hypoxic Resp. Failure due to Acute on chronic COPD exacerbation along with acute on chronic CHF nonspecific, recent COVID-19 infection - he has most likely combination of COPD and CHF exacerbation, he is on IV steroids being dosed by pulmonary critical care, continue azithromycin for atypical coverage, currently on BiPAP being followed by pulmonary critical care, I have given him a dose of IV Lasix early this morning as I think there is an element of CHF as well, echocardiogram pending, will defer management of pulmonary issues to pulmonary critical care team.  Encouraged the patient to sit up in chair in the daytime use I-S and flutter valve for pulmonary toiletry.  Will advance activity and titrate down oxygen as possible.  2.  Recent COVID-19 infection in a patient who is fully vaccinated and was treated with Paxil await.   Monitor inflammatory markers for now steroids should suffice.  I think main issue right now is COPD and CHF exacerbation.  3.  Hypertension.  Placed on Norvasc.  Monitor.  4.  Acute on chronic nonspecific CHF.  Echo pending Lasix IV given early morning 09/11/2020, no previous echo on file.  Chest pain-free.  5.  Recent right upper extremity wound present on admission.  Wound care team consulted.  6.  GERD.  On PPI.  7.  Diarrhea present on admission.  C. difficile ordered on admission Will monitor, of note patient also has hemorrhoids and tends to bleed once he has diarrhea.  Will monitor both.   Recent Labs  Lab 09/10/20 1728 09/10/20 2315 09/11/20 0616  WBC 9.2  --  11.8*  HGB 13.1  --  12.4*  HCT 39.3  --  36.4*  PLT 289  --  256  CRP  --   --  9.1*  BNP  --   --  212.0*  DDIMER  --   --  0.44  PROCALCITON <0.10  --   --   AST 27  --   --   ALT 22  --   --   ALKPHOS 75  --   --   BILITOT 1.1  --   --   ALBUMIN 3.6  --   --   INR 1.0  --   --   LATICACIDVEN 2.0* 2.7*  --          Condition - Extremely Guarded  Family Communication  :  son over the phone 09/11/20  Code Status :  Full  Consults  :  PCCM  PUD Prophylaxis : PPI   Procedures  :     CTA  TTE      Disposition Plan  :    Status is: Inpatient  Remains inpatient appropriate because:IV treatments appropriate due to intensity of illness or inability to take PO  Dispo: The patient is from: Home              Anticipated d/c is to: Home              Patient currently is not medically stable to d/c.   Difficult to place patient No  DVT Prophylaxis  :    enoxaparin (LOVENOX) injection 40 mg Start: 09/10/20 2200 Place TED hose Start: 09/10/20 2129    Lab Results  Component Value Date   PLT 256 09/11/2020    Diet :  Diet Order             Diet Heart Room service appropriate? Yes; Fluid consistency: Thin  Diet effective now                    Inpatient Medications  Scheduled  Meds:  amLODipine  10 mg Oral Daily   Chlorhexidine Gluconate Cloth  6 each Topical Q0600   enoxaparin (LOVENOX) injection  40 mg Subcutaneous Q24H   ipratropium  2 puff Inhalation Q6H   methylPREDNISolone (SOLU-MEDROL) injection  40 mg Intravenous Q12H   mometasone-formoterol  2 puff Inhalation BID   pantoprazole  80 mg Oral Daily   Continuous Infusions:  azithromycin     PRN Meds:.acetaminophen **OR** acetaminophen, albuterol, hydrALAZINE, iohexol, ondansetron **OR** ondansetron (ZOFRAN) IV  Antibiotics  :    Anti-infectives (From admission, onward)    Start     Dose/Rate Route Frequency Ordered Stop   09/11/20 1800  azithromycin (ZITHROMAX) 500 mg in sodium chloride 0.9 % 250 mL IVPB        500 mg 250 mL/hr over 60 Minutes Intravenous Every 24 hours 09/10/20 2131 09/13/20 1759   09/10/20 1730  cefTRIAXone (ROCEPHIN) 2 g in sodium chloride 0.9 % 100 mL IVPB  Status:  Discontinued        2 g 200 mL/hr over  30 Minutes Intravenous Every 24 hours 09/10/20 1728 09/10/20 2131   09/10/20 1730  azithromycin (ZITHROMAX) 500 mg in sodium chloride 0.9 % 250 mL IVPB  Status:  Discontinued        500 mg 250 mL/hr over 60 Minutes Intravenous Every 24 hours 09/10/20 1728 09/10/20 2131        Time Spent in minutes  30   Susa RaringPrashant Leonore Webster M.D on 09/11/2020 at 11:49 AM  To page go to [email or web address]   [facility] -  Office  [phone]     See all Orders from today for further details    Objective:   Vitals:   09/11/20 0838 09/11/20 0857 09/11/20 1056 09/11/20 1100  BP: (!) 161/112  (!) 170/94 (!) 170/94  Pulse: 96 86 100 (!) 102  Resp:  (!) 23 (!) 44 (!) 28  Temp: 98.2 F (36.8 C)  (!) 97.2 F (36.2 C)   TempSrc:   Axillary   SpO2: 98% 99% 98% 100%  Weight:   60.1 kg   Height:        Wt Readings from Last 3 Encounters:  09/11/20 60.1 kg  06/01/20 63.4 kg  10/22/18 60.3 kg     Intake/Output Summary (Last 24 hours) at 09/11/2020 1149 Last data filed at 09/11/2020  0334 Gross per 24 hour  Intake 686.22 ml  Output --  Net 686.22 ml     Physical Exam  Awake Alert, No new F.N deficits, Normal affect [place].AT,PERRAL Supple Neck,No JVD, No cervical lymphadenopathy appriciated.  Symmetrical Chest wall movement, [place] movement bilaterally, ++ wheezing RRR,No Gallops,Rubs or new Murmurs, No Parasternal Heave +ve B.Sounds, Abd Soft, No tenderness, No organomegaly appriciated, No rebound - guarding or rigidity. No Cyanosis, Clubbing or edema, No new Rash or bruise      Data Review:    CBC Recent Labs  Lab 09/10/20 1728 09/11/20 0616  WBC 9.2 11.8*  HGB 13.1 12.4*  HCT 39.3 36.4*  PLT 289 256  MCV 92.7 89.7  MCH 30.9 30.5  MCHC 33.3 34.1  RDW 14.2 14.2  LYMPHSABS 0.5*  --   MONOABS 0.4  --   EOSABS 0.0  --   BASOSABS 0.0  --     Recent Labs  Lab 09/10/20 1728 09/10/20 2315 09/11/20 0616  NA 134*  --  136  K 4.2  --  4.4  CL 97*  --  98  CO2 25  --  28  GLUCOSE 118*  --  117*  BUN 16  --  16  CREATININE 1.07  --  0.95  CALCIUM 9.1  --  8.8*  AST 27  --   --   ALT 22  --   --   ALKPHOS 75  --   --   BILITOT 1.1  --   --   ALBUMIN 3.6  --   --   MG  --   --  2.4  CRP  --   --  9.1*  DDIMER  --   --  0.44  PROCALCITON <0.10  --   --   LATICACIDVEN 2.0* 2.7*  --   INR 1.0  --   --   BNP  --   --  212.0*    ------------------------------------------------------------------------------------------------------------------ No results for input(s): CHOL, HDL, LDLCALC, TRIG, CHOLHDL, LDLDIRECT in the last 72 hours.  No results found for: HGBA1C ------------------------------------------------------------------------------------------------------------------ No results for input(s): TSH, T4TOTAL, T3FREE, THYROIDAB in the last 72 hours.  Invalid input(s): FREET3  Cardiac Enzymes  No results for input(s): CKMB, TROPONINI, MYOGLOBIN in the last 168 hours.  Invalid input(s):  CK ------------------------------------------------------------------------------------------------------------------    Component Value Date/Time   BNP 212.0 (H) 09/11/2020 4970    Micro Results Recent Results (from the past 240 hour(s))  Blood Culture (routine x 2)     Status: None (Preliminary result)   Collection Time: 09/10/20  5:28 PM   Specimen: BLOOD  Result Value Ref Range Status   Specimen Description BLOOD RIGHT ANTECUBITAL  Final   Special Requests   Final    BOTTLES DRAWN AEROBIC AND ANAEROBIC Blood Culture results may not be optimal due to an excessive volume of blood received in culture bottles   Culture   Final    NO GROWTH < 12 HOURS Performed at [facility], [address] [place], [place] [address]    Report Status PENDING  Incomplete  Blood Culture (routine x 2)     Status: None (Preliminary result)   Collection Time: 09/10/20  5:28 PM   Specimen: BLOOD  Result Value Ref Range Status   Specimen Description BLOOD LEFT ANTECUBITAL  Final   Special Requests   Final    BOTTLES DRAWN AEROBIC AND ANAEROBIC Blood Culture adequate volume   Culture   Final    NO GROWTH < 12 HOURS Performed at [facility], [address], [place], [place] [address]    Report Status PENDING  Incomplete    Radiology Reports DG Chest Port 1 View  Result Date: 09/11/2020 CLINICAL DATA:  Respiratory distress EXAM: PORTABLE CHEST 1 VIEW COMPARISON:  None. FINDINGS: Normal mediastinum and cardiac silhouette. There is volume loss in the RIGHT hemithorax related to prior surgery. There is mild airspace disease at the lung bases. No pneumothorax. No pleural fluid. IMPRESSION: Bibasilar mild airspace disease suggests pulmonary edema. Postsurgical volume loss in the RIGHT hemithorax Electronically Signed   By: Genevive Bi M.D.   On: 09/11/2020 07:33   DG Chest Port 1 View  Result Date: 09/10/2020 CLINICAL DATA:  Possible sepsis, history of COVID-19 positivity EXAM:  PORTABLE CHEST 1 VIEW COMPARISON:  11/21/2017 FINDINGS: Cardiac shadow is within normal limits. The lungs are hyperinflated. Persistent scarring and volume loss is noted in the right upper lobe related to prior surgery and stable. Will rib fractures are noted on the right with healing. Mild atelectasis versus scarring is noted in the left base. No confluent focal infiltrate is seen. Aortic calcifications are noted. IMPRESSION: Chronic appearing changes. COPD without acute abnormality. Electronically Signed   By: Alcide Clever M.D.   On: 09/10/2020 17:55

## 2020-09-11 NOTE — Progress Notes (Addendum)
PT Cancellation Note  Patient Details Name: Charles Webster MRN: [id number] DOB: 1946/01/02   Cancelled Treatment:    Reason Eval/Treat Not Completed: Other (comment). Per RN pt on continuous bipap and unable to tolerate PT at this time. PT to re-attempt as patient is medically appropriate.   Note addendum 4:21PM: Pt transferred to higher level of care. Due to this medical change in status, PT orders completed at this time. Please re-consult when patient is medically appropriate for exertional activity.   Olga Coaster PT, DPT 9:20 AM,09/11/20

## 2020-09-11 NOTE — Progress Notes (Signed)
OT Cancellation Note  Patient Details Name: Charles Webster MRN: [id number] DOB: Dec 21, 1945   Cancelled Treatment:    Reason Eval/Treat Not Completed: Medical issues which prohibited therapy. OT order received and chart reviewed. Per RN, pt on continuous BiPAP . OT will hold until pt is able to medically participate in OT intervention.   Jackquline Denmark, MS, OTR/L , CBIS ascom [phone]  09/11/20, 9:21 AM

## 2020-09-12 ENCOUNTER — Inpatient Hospital Stay: Payer: Medicare Other

## 2020-09-12 DIAGNOSIS — J441 Chronic obstructive pulmonary disease with (acute) exacerbation: Secondary | ICD-10-CM | POA: Diagnosis not present

## 2020-09-12 LAB — COMPREHENSIVE METABOLIC PANEL
ALT: 34 U/L (ref 0–44)
AST: 41 U/L (ref 15–41)
Albumin: 3.4 g/dL — ABNORMAL LOW (ref 3.5–5.0)
Alkaline Phosphatase: 72 U/L (ref 38–126)
Anion gap: 10 (ref 5–15)
BUN: 33 mg/dL — ABNORMAL HIGH (ref 8–23)
CO2: 33 mmol/L — ABNORMAL HIGH (ref 22–32)
Calcium: 9.1 mg/dL (ref 8.9–10.3)
Chloride: 96 mmol/L — ABNORMAL LOW (ref 98–111)
Creatinine, Ser: 1.04 mg/dL (ref 0.61–1.24)
GFR, Estimated: >60 mL/min (ref >60)
Glucose, Bld: 102 mg/dL — ABNORMAL HIGH (ref 70–99)
Potassium: 4.5 mmol/L (ref 3.5–5.1)
Sodium: 139 mmol/L (ref 135–145)
Total Bilirubin: 1 mg/dL (ref 0.3–1.2)
Total Protein: 6.7 g/dL (ref 6.5–8.1)

## 2020-09-12 LAB — CBC WITH DIFFERENTIAL/PLATELET
Abs Immature Granulocytes: 0.09 10*3/uL — ABNORMAL HIGH (ref 0.00–0.07)
Basophils Absolute: 0 10*3/uL (ref 0.0–0.1)
Basophils Relative: 0 %
Eosinophils Absolute: 0 10*3/uL (ref 0.0–0.5)
Eosinophils Relative: 0 %
HCT: 41.7 % (ref 39.0–52.0)
Hemoglobin: 13.7 g/dL (ref 13.0–17.0)
Immature Granulocytes: 1 %
Lymphocytes Relative: 3 %
Lymphs Abs: 0.4 10*3/uL — ABNORMAL LOW (ref 0.7–4.0)
MCH: 30.2 pg (ref 26.0–34.0)
MCHC: 32.9 g/dL (ref 30.0–36.0)
MCV: 91.9 fL (ref 80.0–100.0)
Monocytes Absolute: 0.6 10*3/uL (ref 0.1–1.0)
Monocytes Relative: 4 %
Neutro Abs: 13.7 10*3/uL — ABNORMAL HIGH (ref 1.7–7.7)
Neutrophils Relative %: 92 %
Platelets: 306 10*3/uL (ref 150–400)
RBC: 4.54 MIL/uL (ref 4.22–5.81)
RDW: 14.1 % (ref 11.5–15.5)
WBC: 14.8 10*3/uL — ABNORMAL HIGH (ref 4.0–10.5)
nRBC: 0 % (ref 0.0–0.2)

## 2020-09-12 LAB — PHOSPHORUS: Phosphorus: 4.1 mg/dL (ref 2.5–4.6)

## 2020-09-12 LAB — D-DIMER, QUANTITATIVE: D-Dimer, Quant: 0.5 ug/mL-FEU (ref 0.00–0.50)

## 2020-09-12 LAB — ECHOCARDIOGRAM COMPLETE
AV Peak grad: 2.6 mmHg
Ao pk vel: 0.8 m/s
Area-P 1/2: 2.36 cm2
Height: 63 in
S' Lateral: 2.67 cm
Weight: 2119.94 oz

## 2020-09-12 LAB — URINE CULTURE: Culture: NO GROWTH

## 2020-09-12 LAB — TYPE AND SCREEN
ABO/RH(D): O NEG
Antibody Screen: NEGATIVE

## 2020-09-12 LAB — GLUCOSE, CAPILLARY: Glucose-Capillary: 108 mg/dL — ABNORMAL HIGH (ref 70–99)

## 2020-09-12 LAB — C-REACTIVE PROTEIN: CRP: 7.9 mg/dL — ABNORMAL HIGH (ref <1.0)

## 2020-09-12 LAB — BRAIN NATRIURETIC PEPTIDE: B Natriuretic Peptide: 300.6 pg/mL — ABNORMAL HIGH (ref 0.0–100.0)

## 2020-09-12 LAB — PROCALCITONIN: Procalcitonin: <0.1 ng/mL

## 2020-09-12 LAB — MAGNESIUM: Magnesium: 2.7 mg/dL — ABNORMAL HIGH (ref 1.7–2.4)

## 2020-09-12 MED ORDER — TRAZODONE HCL 50 MG PO TABS
25.0000 mg | ORAL_TABLET | Freq: Every evening | ORAL | Status: DC | PRN
  Administered 2020-09-12: 25 mg via ORAL
  Filled 2020-09-12 (×2): qty 1

## 2020-09-12 MED ORDER — LORAZEPAM 0.5 MG PO TABS: 0.5000 mg | ORAL_TABLET | Freq: Every evening | ORAL | Status: DC | PRN

## 2020-09-12 MED ORDER — FUROSEMIDE 10 MG/ML IJ SOLN
60.0000 mg | Freq: Once | INTRAMUSCULAR | Status: AC
  Administered 2020-09-12: 60 mg via INTRAVENOUS
  Filled 2020-09-12: qty 6

## 2020-09-12 NOTE — Progress Notes (Signed)
Pt taken off bipap and placed on HFNC 10L, sats 99%, respiratory rate 20/min, tolerating well at this time.

## 2020-09-12 NOTE — Progress Notes (Signed)
OT Cancellation Note  Patient Details Name: Charles Webster MRN: [id number] DOB: [birth year]   Cancelled Treatment:    Reason Eval/Treat Not Completed: Medical issues which prohibited therapy. Chart reviewed. Pt transferred from 2A to ICU over the weekend 2/2 change in status. No continue at transfer in place. Will complete current OT order. Please re-consult when medically appropriate.   Wynona Canes, MPH, MS, OTR/L ascom [phone] 09/12/20, 8:06 AM

## 2020-09-12 NOTE — Progress Notes (Signed)
PROGRESS NOTE                                                                                                                                                                                                             Patient Demographics:    Charles Webster, is a 75 y.o. male, DOB - 23-Nov-1945, WUJ:[id number]  Outpatient Primary MD for the patient is Feldpausch, Madaline Guthrie, MD    LOS - 1  Admit date - 09/10/2020    Chief Complaint  Patient presents with   Respiratory Distress       Brief Narrative (HPI from H&P)    Charles Webster is a 75 y.o. male with medical history significant for COPD, chronic O2 supplementation, GERD, presents emergency department for chief concerns of shortness of breath.  ER he was diagnosed with acute hypoxic respiratory failure due to COPD exacerbation, he was placed on BiPAP and admitted to the hospital.   Subjective:   She had in bed on nasal cannula in mild respiratory distress, denies any headache chest or abdominal pain, shortness of breath has improved, he does get short of breath on laying flat and feels better sitting up.   Assessment  & Plan :     Acute Hypoxic Resp. Failure due to Acute on chronic COPD exacerbation along with acute on chronic CHF nonspecific, recent COVID-19 infection - he has most likely combination of COPD and CHF exacerbation, he is on IV steroids being dosed by pulmonary critical care, continue azithromycin for atypical coverage, currently off BiPAP & being followed by pulmonary critical care, I have given him a dose of IV Lasix again this morning as I think Echocardiogram and CTA noted.  We will continue diuresis with Lasix defer steroids and antibiotics to pulmonary, still has significant wheezing, question if we should increase steroid dose.  Encouraged the patient to sit up in chair in the daytime use I-S and flutter valve for pulmonary toiletry.  Will advance activity and  titrate down oxygen as possible.  2.  Recent COVID-19 infection in a patient who is fully vaccinated and was treated with Paxil await.  Monitor inflammatory markers for now steroids should suffice.  I think main issue right now is COPD and CHF exacerbation.  3.  Hypertension.  Placed on Norvasc.  Monitor.  4.  Acute on diastolic CHF  with EF 50%.  Echo noted, continue Lasix IV, chest pain-free and does have orthopnea..  5.  Recent right upper extremity wound present on admission.  Wound care team consulted.  6.  GERD.  On PPI.  7.  Diarrhea present on admission.  C. difficile ordered on admission Will monitor, of note patient also has hemorrhoids and tends to bleed once he has diarrhea.  Will monitor both.   Recent Labs  Lab 09/10/20 1728 09/10/20 2315 09/11/20 0616 09/12/20 0432  WBC 9.2  --  11.8* 14.8*  HGB 13.1  --  12.4* 13.7  HCT 39.3  --  36.4* 41.7  PLT 289  --  256 306  CRP  --   --  9.1*  --   BNP  --   --  212.0* 300.6*  DDIMER  --   --  0.44 0.50  PROCALCITON <0.10  --   --  <0.10  AST 27  --   --  41  ALT 22  --   --  34  ALKPHOS 75  --   --  72  BILITOT 1.1  --   --  1.0  ALBUMIN 3.6  --   --  3.4*  INR 1.0  --   --   --   LATICACIDVEN 2.0* 2.7*  --   --          Condition - Extremely Guarded  Family Communication  :  son over the phone 09/11/20, message left for son Tabius [phone] his cell phone on 09/12/2020 at 10 AM  Code Status :  Full  Consults  :  PCCM  PUD Prophylaxis : PPI   Procedures  :     CTA -  1. No evidence of pulmonary embolism. 2. 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 3. Marked severity emphysematous lung disease with mild to moderate severity left apical and bilateral lower lobe scarring and/or atelectasis  TTE -  1. Left ventricular ejection fraction, by estimation, is 55 to 60%. The left ventricle has normal function. Left ventricular endocardial border not  optimally defined to evaluate regional wall motion. Left ventricular diastolic parameters are consistent with Grade I diastolic dysfunction (impaired relaxation).  2. Right ventricular systolic function is normal. The right ventricular size is normal. Tricuspid regurgitation signal is inadequate for assessing PA pressure.  3. The mitral valve was not well visualized. No evidence of mitral valve regurgitation. No evidence of mitral stenosis.  4. The aortic valve was not well visualized. Aortic valve regurgitation is not visualized. No aortic stenosis is present.  5. The inferior vena cava is dilated in size with >50% respiratory variability, suggesting right atrial pressure of 8 mmHg.  6. Challenging image quality.      Disposition Plan  :    Status is: Inpatient  Remains inpatient appropriate because:IV treatments appropriate due to intensity of illness or inability to take PO  Dispo: The patient is from: Home              Anticipated d/c is to: Home              Patient currently is not medically stable to d/c.   Difficult to place patient No  DVT Prophylaxis  :    enoxaparin (LOVENOX) injection 40 mg Start: 09/10/20 2200 Place TED hose Start: 09/10/20 2129    Lab Results  Component Value Date   PLT 306 09/12/2020    Diet :  Diet Order             Diet Heart Room service appropriate? Yes; Fluid consistency: Thin  Diet effective now                    Inpatient Medications  Scheduled Meds:  amLODipine  10 mg Oral Daily   Chlorhexidine Gluconate Cloth  6 each Topical Q0600   enoxaparin (LOVENOX) injection  40 mg Subcutaneous Q24H   ipratropium  2 puff Inhalation Q6H   methylPREDNISolone (SOLU-MEDROL) injection  40 mg Intravenous Q12H   mometasone-formoterol  2 puff Inhalation BID   mupirocin ointment   Nasal BID   pantoprazole  80 mg Oral Daily   Continuous Infusions:  azithromycin 250 mL/hr at 09/11/20 1800   PRN Meds:.acetaminophen **OR** acetaminophen,  albuterol, hydrALAZINE, LORazepam, [DISCONTINUED] ondansetron **OR** ondansetron (ZOFRAN) IV, traZODone  Antibiotics  :    Anti-infectives (From admission, onward)    Start     Dose/Rate Route Frequency Ordered Stop   09/11/20 1800  azithromycin (ZITHROMAX) 500 mg in sodium chloride 0.9 % 250 mL IVPB        500 mg 250 mL/hr over 60 Minutes Intravenous Every 24 hours 09/10/20 2131 09/13/20 1759   09/10/20 1730  cefTRIAXone (ROCEPHIN) 2 g in sodium chloride 0.9 % 100 mL IVPB  Status:  Discontinued        2 g 200 mL/hr over 30 Minutes Intravenous Every 24 hours 09/10/20 1728 09/10/20 2131   09/10/20 1730  azithromycin (ZITHROMAX) 500 mg in sodium chloride 0.9 % 250 mL IVPB  Status:  Discontinued        500 mg 250 mL/hr over 60 Minutes Intravenous Every 24 hours 09/10/20 1728 09/10/20 2131        Time Spent in minutes  30   Susa Raring M.D on 09/12/2020 at 10:04 AM  To page go to [email or web address]   [facility] -  Office  [phone]     See all Orders from today for further details    Objective:   Vitals:   09/12/20 0300 09/12/20 0400 09/12/20 0500 09/12/20 0600  BP: 126/84 120/81 115/82 124/80  Pulse: 69 (!) 47 62 85  Resp: (!) 21 (!) 21 16 20   Temp:      TempSrc:      SpO2: 96% (!) 89% 95% 95%  Weight:      Height:        Wt Readings from Last 3 Encounters:  09/11/20 60.1 kg  06/01/20 63.4 kg  10/22/18 60.3 kg     Intake/Output Summary (Last 24 hours) at 09/12/2020 1004 Last data filed at 09/12/2020 0600 Gross per 24 hour  Intake 250.03 ml  Output 2650 ml  Net -2399.97 ml     Physical Exam  Awake Alert, No new F.N deficits, Normal affect [place].AT,PERRAL Supple Neck,No JVD, No cervical lymphadenopathy appriciated.  Symmetrical Chest wall movement, Mod air movement bilaterally, ++ wheezing RRR,No Gallops, Rubs or new Murmurs, No Parasternal Heave +ve B.Sounds, Abd Soft, No tenderness, No organomegaly appriciated, No rebound - guarding or  rigidity. No Cyanosis, Clubbing or edema, No new Rash or bruise    Data Review:    CBC Recent Labs  Lab 09/10/20 1728 09/11/20 0616 09/12/20 0432  WBC 9.2 11.8* 14.8*  HGB 13.1 12.4* 13.7  HCT 39.3 36.4* 41.7  PLT 289 256 306  MCV 92.7 89.7 91.9  MCH 30.9 30.5 30.2  MCHC 33.3 34.1 32.9  RDW 14.2 14.2 14.1  LYMPHSABS 0.5*  --  0.4*  MONOABS 0.4  --  0.6  EOSABS 0.0  --  0.0  BASOSABS 0.0  --  0.0    Recent Labs  Lab 09/10/20 1728 09/10/20 2315 09/11/20 0616 09/12/20 0432  NA 134*  --  136 139  K 4.2  --  4.4 4.5  CL 97*  --  98 96*  CO2 25  --  28 33*  GLUCOSE 118*  --  117* 102*  BUN 16  --  16 33*  CREATININE 1.07  --  0.95 1.04  CALCIUM 9.1  --  8.8* 9.1  AST 27  --   --  41  ALT 22  --   --  34  ALKPHOS 75  --   --  72  BILITOT 1.1  --   --  1.0  ALBUMIN 3.6  --   --  3.4*  MG  --   --  2.4 2.7*  CRP  --   --  9.1*  --   DDIMER  --   --  0.44 0.50  PROCALCITON <0.10  --   --  <0.10  LATICACIDVEN 2.0* 2.7*  --   --   INR 1.0  --   --   --   BNP  --   --  212.0* 300.6*    ------------------------------------------------------------------------------------------------------------------ No results for input(s): CHOL, HDL, LDLCALC, TRIG, CHOLHDL, LDLDIRECT in the last 72 hours.  No results found for: HGBA1C ------------------------------------------------------------------------------------------------------------------ No results for input(s): TSH, T4TOTAL, T3FREE, THYROIDAB in the last 72 hours.  Invalid input(s): FREET3  Cardiac Enzymes No results for input(s): CKMB, TROPONINI, MYOGLOBIN in the last 168 hours.  Invalid input(s): CK ------------------------------------------------------------------------------------------------------------------    Component Value Date/Time   BNP 300.6 (H) 09/12/2020 1610    Micro Results Recent Results (from the past 240 hour(s))  Blood Culture (routine x 2)     Status: None (Preliminary result)    Collection Time: 09/10/20  5:28 PM   Specimen: BLOOD  Result Value Ref Range Status   Specimen Description BLOOD RIGHT ANTECUBITAL  Final   Special Requests   Final    BOTTLES DRAWN AEROBIC AND ANAEROBIC Blood Culture results may not be optimal due to an excessive volume of blood received in culture bottles   Culture   Final    NO GROWTH < 12 HOURS Performed at [facility], [address], [place], [place] [address]    Report Status PENDING  Incomplete  Blood Culture (routine x 2)     Status: None (Preliminary result)   Collection Time: 09/10/20  5:28 PM   Specimen: BLOOD  Result Value Ref Range Status   Specimen Description BLOOD LEFT ANTECUBITAL  Final   Special Requests   Final    BOTTLES DRAWN AEROBIC AND ANAEROBIC Blood Culture adequate volume   Culture   Final    NO GROWTH < 12 HOURS Performed at [facility], [address], [place], [place] [address]    Report Status PENDING  Incomplete  Urine Culture     Status: None   Collection Time: 09/10/20  6:29 PM   Specimen: Urine, Random  Result Value Ref Range Status   Specimen Description   Final    URINE, RANDOM Performed at [facility], [address] [place], [place] [address]    Special Requests   Final    NONE Performed at [facility], [address] [place], [place] [address]  Culture   Final    NO GROWTH Performed at [facility], [address], [place], [place] [address]    Report Status 09/12/2020 FINAL  Final  MRSA Next Gen by PCR, Nasal     Status: Abnormal   Collection Time: 09/11/20  2:00 PM   Specimen: Nasal Mucosa; Nasal Swab  Result Value Ref Range Status   MRSA by PCR Next Gen DETECTED (A) NOT DETECTED Final    Comment: RESULT CALLED TO, READ BACK BY AND VERIFIED WITH: RANDY PRESNELL @ 1526 ON 09/11/2020 BY CAF (NOTE) The GeneXpert MRSA Assay (FDA approved for NASAL specimens only), is one component of a comprehensive MRSA colonization  surveillance program. It is not intended to diagnose MRSA infection nor to guide or monitor treatment for MRSA infections. Test performance is not FDA approved in patients less than 98 years old. Performed at [facility], [address], [place], [place] [address]     Radiology Reports CT Angio Chest Pulmonary Embolism (PE) W or WO Contrast  Result Date: 09/12/2020 CLINICAL DATA:  Suspected pulmonary embolism. EXAM: CT ANGIOGRAPHY CHEST WITH CONTRAST TECHNIQUE: Multidetector CT imaging of the chest was performed using the standard protocol during bolus administration of intravenous contrast. Multiplanar CT image reconstructions and MIPs were obtained to evaluate the vascular anatomy. CONTRAST:  55mL OMNIPAQUE IOHEXOL 350 MG/ML SOLN COMPARISON:  None. FINDINGS: Cardiovascular: There is mild to moderate severity calcification of the aortic arch. Satisfactory opacification of the pulmonary arteries to the segmental level. No evidence of pulmonary embolism. Normal heart size with moderate severity coronary artery calcification. No pericardial effusion. Mediastinum/Nodes: Small, partially calcified pretracheal and bilateral hilar nodes are seen. Thyroid gland, trachea, and esophagus demonstrate no significant findings. Lungs/Pleura: There is marked severity emphysematous lung disease. Postoperative changes are seen on the right, consistent with the patient's history of partial right lobectomy. Subsequent right-sided volume loss is seen with left to right shift of the superior mediastinal structures. A 3.4 cm x 2.5 cm x 4.1 cm area of fluid attenuation, with a thin partially calcified surrounding wall, is seen within the posterior aspect of the upper right lung (approximately 21 Hounsfield units). This is also likely postoperative in origin. Mild to moderate severity areas of scarring and atelectasis are seen along the left apex and posterior aspect of the bilateral lower lobes. Similar appearing  changes are seen within the anterior aspect of the upper right lung. No pneumothorax is identified. Upper Abdomen: No acute abnormality. Musculoskeletal: Chronic and postoperative deformities are seen involving multiple right-sided ribs. Chronic fourth, fifth and sixth left rib fractures are seen. Review of the MIP images confirms the above findings. IMPRESSION: 1. No evidence of pulmonary embolism. 2. 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 3. Marked severity emphysematous lung disease with mild to moderate severity left apical and bilateral lower lobe scarring and/or atelectasis. Electronically Signed   By: Aram Candela M.D.   On: 09/12/2020 01:17   DG Chest Port 1 View  Result Date: 09/12/2020 CLINICAL DATA:  Respiratory distress. EXAM: PORTABLE CHEST 1 VIEW COMPARISON:  CTA chest from same day.  Chest x-ray from yesterday. FINDINGS: Normal heart size. Similar postsurgical changes, scarring, and volume loss in the right lung apex with rightward tracheal shift. Chronically coarsened interstitial markings again noted with lung hyperinflation and emphysematous changes. Similar reticulonodular densities at both lung bases. Similar scarring at the right costophrenic angle. No acute osseous abnormality. IMPRESSION: 1. Similar mild reticulonodular densities  at both lung bases which may be infectious or inflammatory. 2. COPD with stable postsurgical changes, scarring, and volume loss in the right lung apex. Electronically Signed   By: Obie DredgeWilliam T Derry M.D.   On: 09/12/2020 07:41   DG Chest Port 1 View  Result Date: 09/11/2020 CLINICAL DATA:  Respiratory distress EXAM: PORTABLE CHEST 1 VIEW COMPARISON:  None. FINDINGS: Normal mediastinum and cardiac silhouette. There is volume loss in the RIGHT hemithorax related to prior surgery. There is mild airspace disease at the lung bases. No pneumothorax. No pleural fluid. IMPRESSION: Bibasilar mild  airspace disease suggests pulmonary edema. Postsurgical volume loss in the RIGHT hemithorax Electronically Signed   By: Genevive BiStewart  Edmunds M.D.   On: 09/11/2020 07:33   DG Chest Port 1 View  Result Date: 09/10/2020 CLINICAL DATA:  Possible sepsis, history of COVID-19 positivity EXAM: PORTABLE CHEST 1 VIEW COMPARISON:  11/21/2017 FINDINGS: Cardiac shadow is within normal limits. The lungs are hyperinflated. Persistent scarring and volume loss is noted in the right upper lobe related to prior surgery and stable. Will rib fractures are noted on the right with healing. Mild atelectasis versus scarring is noted in the left base. No confluent focal infiltrate is seen. Aortic calcifications are noted. IMPRESSION: Chronic appearing changes. COPD without acute abnormality. Electronically Signed   By: Alcide CleverMark  Lukens M.D.   On: 09/10/2020 17:55   ECHOCARDIOGRAM COMPLETE  Result Date: 09/12/2020    ECHOCARDIOGRAM REPORT   Patient Name:   Loma MessingJERRY W Kendall Date of Exam: 09/11/2020 Medical Rec #:  [id number]     Height:       63.0 in Accession #:    [id number]    Weight:       132.5 lb Date of Birth:  [birth year]    BSA:          1.623 m Patient Age:    74 years      BP:           170/94 mmHg Patient Gender: M             HR:           96 bpm. Exam Location:  [facility] Procedure: 2D Echo and Intracardiac Opacification Agent Indications:     CHF I50.31  History:         Patient has no prior history of Echocardiogram examinations.  Sonographer:     Overton Mamikeshia Johnson RDCS Referring Phys:  Effie [id number] [address] ScotlandRASHANT K [facility] Diagnosing Phys: Lorine BearsMuhammad Arida MD  Sonographer Comments: Technically challenging study due to limited acoustic windows, suboptimal parasternal window, suboptimal apical window and suboptimal subcostal window. IMPRESSIONS  1. Left ventricular ejection fraction, by estimation, is 55 to 60%. The left ventricle has normal function. Left ventricular endocardial border not optimally defined to evaluate regional wall motion. Left  ventricular diastolic parameters are consistent with Grade I diastolic dysfunction (impaired relaxation).  2. Right ventricular systolic function is normal. The right ventricular size is normal. Tricuspid regurgitation signal is inadequate for assessing PA pressure.  3. The mitral valve was not well visualized. No evidence of mitral valve regurgitation. No evidence of mitral stenosis.  4. The aortic valve was not well visualized. Aortic valve regurgitation is not visualized. No aortic stenosis is present.  5. The inferior vena cava is dilated in size with >50% respiratory variability, suggesting right atrial pressure of 8 mmHg.  6. Challenging image quality. FINDINGS  Left Ventricle: Left ventricular ejection fraction, by estimation, is 55 to 60%. The left ventricle has normal  function. Left ventricular endocardial border not optimally defined to evaluate regional wall motion. Definity contrast agent was given IV to delineate the left ventricular endocardial borders. The left ventricular internal cavity size was normal in size. There is no left ventricular hypertrophy. Left ventricular diastolic parameters are consistent with Grade I diastolic dysfunction (impaired relaxation). Right Ventricle: The right ventricular size is normal. No increase in right ventricular wall thickness. Right ventricular systolic function is normal. Tricuspid regurgitation signal is inadequate for assessing PA pressure. Left Atrium: Left atrial size was normal in size. Right Atrium: Right atrial size was normal in size. Pericardium: There is no evidence of pericardial effusion. Mitral Valve: The mitral valve was not well visualized. No evidence of mitral valve regurgitation. No evidence of mitral valve stenosis. Tricuspid Valve: The tricuspid valve is not well visualized. Tricuspid valve regurgitation is not demonstrated. No evidence of tricuspid stenosis. Aortic Valve: The aortic valve was not well visualized. Aortic valve regurgitation  is not visualized. No aortic stenosis is present. Aortic valve peak gradient measures 2.6 mmHg. Pulmonic Valve: The pulmonic valve was not well visualized. Pulmonic valve regurgitation is not visualized. No evidence of pulmonic stenosis. Aorta: The aortic root was not well visualized. Venous: The inferior vena cava is dilated in size with greater than 50% respiratory variability, suggesting right atrial pressure of 8 mmHg. IAS/Shunts: No atrial level shunt detected by color flow Doppler.  LEFT VENTRICLE PLAX 2D LVIDd:         3.86 cm Diastology LVIDs:         2.67 cm LV e' lateral:   7.72 cm/s LV PW:         1.04 cm LV E/e' lateral: 6.1 LV IVS:        0.88 cm  LEFT ATRIUM           Index      RIGHT ATRIUM          Index LA Vol (A4C): 12.9 ml 7.95 ml/m RA Area:     7.62 cm                                  RA Volume:   15.90 ml 9.80 ml/m  AORTIC VALVE AV Vmax:      80.10 cm/s AV Peak Grad: 2.6 mmHg LVOT Vmax:    73.80 cm/s LVOT Vmean:   49.700 cm/s LVOT VTI:     0.104 m  AORTA Ao Root diam: 3.20 cm MITRAL VALVE MV Area (PHT): 2.36 cm    SHUNTS MV Decel Time: 322 msec    Systemic VTI: 0.10 m MV E velocity: 47.10 cm/s MV A velocity: 84.00 cm/s MV E/A ratio:  0.56 Lorine Bears MD Electronically signed by Lorine Bears MD Signature Date/Time: 09/12/2020/7:49:28 AM    Final

## 2020-09-13 DIAGNOSIS — J441 Chronic obstructive pulmonary disease with (acute) exacerbation: Secondary | ICD-10-CM | POA: Diagnosis not present

## 2020-09-13 LAB — COMPREHENSIVE METABOLIC PANEL
ALT: 31 U/L (ref 0–44)
AST: 27 U/L (ref 15–41)
Albumin: 3.2 g/dL — ABNORMAL LOW (ref 3.5–5.0)
Alkaline Phosphatase: 71 U/L (ref 38–126)
Anion gap: 10 (ref 5–15)
BUN: 51 mg/dL — ABNORMAL HIGH (ref 8–23)
CO2: 34 mmol/L — ABNORMAL HIGH (ref 22–32)
Calcium: 9.1 mg/dL (ref 8.9–10.3)
Chloride: 94 mmol/L — ABNORMAL LOW (ref 98–111)
Creatinine, Ser: 1.12 mg/dL (ref 0.61–1.24)
GFR, Estimated: >60 mL/min (ref >60)
Glucose, Bld: 137 mg/dL — ABNORMAL HIGH (ref 70–99)
Potassium: 4.3 mmol/L (ref 3.5–5.1)
Sodium: 138 mmol/L (ref 135–145)
Total Bilirubin: 0.7 mg/dL (ref 0.3–1.2)
Total Protein: 7.1 g/dL (ref 6.5–8.1)

## 2020-09-13 LAB — D-DIMER, QUANTITATIVE: D-Dimer, Quant: 0.4 ug/mL-FEU (ref 0.00–0.50)

## 2020-09-13 LAB — CBC WITH DIFFERENTIAL/PLATELET
Abs Immature Granulocytes: 0.1 10*3/uL — ABNORMAL HIGH (ref 0.00–0.07)
Basophils Absolute: 0 10*3/uL (ref 0.0–0.1)
Basophils Relative: 0 %
Eosinophils Absolute: 0 10*3/uL (ref 0.0–0.5)
Eosinophils Relative: 0 %
HCT: 40.8 % (ref 39.0–52.0)
Hemoglobin: 13.5 g/dL (ref 13.0–17.0)
Immature Granulocytes: 1 %
Lymphocytes Relative: 2 %
Lymphs Abs: 0.3 10*3/uL — ABNORMAL LOW (ref 0.7–4.0)
MCH: 30.3 pg (ref 26.0–34.0)
MCHC: 33.1 g/dL (ref 30.0–36.0)
MCV: 91.5 fL (ref 80.0–100.0)
Monocytes Absolute: 0.3 10*3/uL (ref 0.1–1.0)
Monocytes Relative: 2 %
Neutro Abs: 13.1 10*3/uL — ABNORMAL HIGH (ref 1.7–7.7)
Neutrophils Relative %: 95 %
Platelets: 328 10*3/uL (ref 150–400)
RBC: 4.46 MIL/uL (ref 4.22–5.81)
RDW: 14.1 % (ref 11.5–15.5)
WBC: 13.7 10*3/uL — ABNORMAL HIGH (ref 4.0–10.5)
nRBC: 0 % (ref 0.0–0.2)

## 2020-09-13 LAB — LEGIONELLA PNEUMOPHILA SEROGP 1 UR AG: L. pneumophila Serogp 1 Ur Ag: NEGATIVE

## 2020-09-13 LAB — PHOSPHORUS: Phosphorus: 4.8 mg/dL — ABNORMAL HIGH (ref 2.5–4.6)

## 2020-09-13 LAB — C-REACTIVE PROTEIN: CRP: 6.3 mg/dL — ABNORMAL HIGH (ref <1.0)

## 2020-09-13 LAB — PROCALCITONIN: Procalcitonin: <0.1 ng/mL

## 2020-09-13 LAB — MAGNESIUM: Magnesium: 2.8 mg/dL — ABNORMAL HIGH (ref 1.7–2.4)

## 2020-09-13 LAB — BRAIN NATRIURETIC PEPTIDE: B Natriuretic Peptide: 138.6 pg/mL — ABNORMAL HIGH (ref 0.0–100.0)

## 2020-09-13 MED ORDER — ALBUTEROL SULFATE HFA 108 (90 BASE) MCG/ACT IN AERS
2.0000 | INHALATION_SPRAY | Freq: Four times a day (QID) | RESPIRATORY_TRACT | Status: DC
  Administered 2020-09-13 – 2020-09-22 (×37): 2 via RESPIRATORY_TRACT
  Filled 2020-09-13 (×2): qty 6.7

## 2020-09-13 MED ORDER — AZITHROMYCIN 250 MG PO TABS
250.0000 mg | ORAL_TABLET | Freq: Every day | ORAL | Status: AC
  Administered 2020-09-13 – 2020-09-14 (×2): 250 mg via ORAL
  Filled 2020-09-13 (×2): qty 1

## 2020-09-13 NOTE — Progress Notes (Signed)
Assisted with family video call 

## 2020-09-13 NOTE — Plan of Care (Signed)
Neuro: A&O x 4, moves well in bed Resp: weaned to 5L [place], tolerating well CV: afebrile, vital signs stable, no edema GIGU: condom cath in place, no BM-passing gas, tolerating PO well Skin: skin tear R FA, refused dressing Social: update given to daughter, all questions and concerns addressed, video called with patient today  Events: Transfer to room 204, report given to Darl Pikes RN  Problem: Education: Goal: Knowledge of General Education information will improve Description: Including pain rating scale, medication(s)/side effects and non-pharmacologic comfort measures Outcome: Progressing   Problem: Health Behavior/Discharge Planning: Goal: Ability to manage health-related needs will improve Outcome: Progressing   Problem: Clinical Measurements: Goal: Ability to maintain clinical measurements within normal limits will improve Outcome: Progressing Goal: Will remain free from infection Outcome: Progressing Goal: Diagnostic test results will improve Outcome: Progressing Goal: Respiratory complications will improve Outcome: Progressing Goal: Cardiovascular complication will be avoided Outcome: Progressing   Problem: Activity: Goal: Risk for activity intolerance will decrease Outcome: Progressing   Problem: Nutrition: Goal: Adequate nutrition will be maintained Outcome: Progressing   Problem: Coping: Goal: Level of anxiety will decrease Outcome: Progressing   Problem: Elimination: Goal: Will not experience complications related to bowel motility Outcome: Progressing Goal: Will not experience complications related to urinary retention Outcome: Progressing   Problem: Pain Managment: Goal: General experience of comfort will improve Outcome: Progressing   Problem: Safety: Goal: Ability to remain free from injury will improve Outcome: Progressing   Problem: Skin Integrity: Goal: Risk for impaired skin integrity will decrease Outcome: Progressing

## 2020-09-13 NOTE — Progress Notes (Signed)
PROGRESS NOTE                                                                                                                                                                                                             Patient Demographics:    Charles Webster, is a 75 y.o. male, DOB - 06-30-1945, XLK:[id number]  Outpatient Primary MD for the patient is Feldpausch, Charles Guthrieale E, MD    LOS - 2  Admit date - 09/10/2020    Chief Complaint  Patient presents with   Respiratory Distress       Brief Narrative (HPI from H&P)    Charles Webster is a 75 y.o. male with medical history significant for COPD, chronic O2 supplementation, GERD, presents emergency department for chief concerns of shortness of breath.  ER he was diagnosed with acute hypoxic respiratory failure due to COPD exacerbation, he was placed on BiPAP and admitted to the hospital.   Subjective:   Patient in bed, appears comfortable, denies any headache, no fever, no chest pain or pressure, improved shortness of breath , no abdominal pain. No new focal weakness.   Assessment  & Plan :     Acute Hypoxic Resp. Failure due to Acute on chronic COPD exacerbation along with acute on chronic CHF nonspecific, recent COVID-19 infection - he has most likely combination of COPD and CHF exacerbation, he with IV Lasix along with IV steroids and azithromycin for atypical coverage, initially on BiPAP for a few days now on high flow nasal cannula 8 L, of note he uses oxygen at home as well at baseline, case discussed with pulmonologist Dr. [name] on 09/13/2020.  No change in plan.  Continue IV steroids and azithromycin for a few more days, note his BUN has gone up considerably and clinically now appears euvolemic will hold further diuretics  Encouraged the patient to sit up in chair in the daytime use I-S and flutter valve for pulmonary toiletry.  Will advance activity and titrate down oxygen as  possible.   2.  Recent COVID-19 infection in a patient who is fully vaccinated and was treated with Paxil await.  Monitor inflammatory markers for now steroids should suffice.  I think main issue right now is COPD and CHF exacerbation.  3.  Hypertension.  Placed on Norvasc.  Monitor.  4.  Acute on diastolic  CHF with EF 50%.  Echo noted, continue Lasix IV, chest pain-free and does have orthopnea..  5.  Recent right upper extremity wound present on admission.  Wound care team consulted.  6.  GERD.  On PPI.  7.  Diarrhea present on admission.  Better, says he gets Diarrhea on ABX, monitor.   Recent Labs  Lab 09/10/20 1728 09/10/20 2315 09/11/20 0616 09/12/20 0432 09/12/20 0642 09/13/20 0416  WBC 9.2  --  11.8* 14.8*  --  13.7*  HGB 13.1  --  12.4* 13.7  --  13.5  HCT 39.3  --  36.4* 41.7  --  40.8  PLT 289  --  256 306  --  328  CRP  --   --  9.1*  --  7.9*  --   BNP  --   --  212.0* 300.6*  --  138.6*  DDIMER  --   --  0.44 0.50  --  0.40  PROCALCITON <0.10  --   --  <0.10  --  <0.10  AST 27  --   --  41  --  27  ALT 22  --   --  34  --  31  ALKPHOS 75  --   --  72  --  71  BILITOT 1.1  --   --  1.0  --  0.7  ALBUMIN 3.6  --   --  3.4*  --  3.2*  INR 1.0  --   --   --   --   --   LATICACIDVEN 2.0* 2.7*  --   --   --   --          Condition - Extremely Guarded  Family Communication  :  son over the phone 09/11/20, message left for son Lipa [phone] on his cell phone on 09/12/2020 at 10 AM, called 09/13/20   Code Status :  Full  Consults  :  PCCM  PUD Prophylaxis : PPI   Procedures  :     CTA -  1. No evidence of pulmonary embolism. 2. 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 3. Marked severity emphysematous lung disease with mild to moderate severity left apical and bilateral lower lobe scarring and/or atelectasis  TTE -  1. Left ventricular ejection fraction, by estimation, is 55 to 60%. The left  ventricle has normal function. Left ventricular endocardial border not optimally defined to evaluate regional wall motion. Left ventricular diastolic parameters are consistent with Grade I diastolic dysfunction (impaired relaxation).  2. Right ventricular systolic function is normal. The right ventricular size is normal. Tricuspid regurgitation signal is inadequate for assessing PA pressure.  3. The mitral valve was not well visualized. No evidence of mitral valve regurgitation. No evidence of mitral stenosis.  4. The aortic valve was not well visualized. Aortic valve regurgitation is not visualized. No aortic stenosis is present.  5. The inferior vena cava is dilated in size with >50% respiratory variability, suggesting right atrial pressure of 8 mmHg.  6. Challenging image quality.      Disposition Plan  :    Status is: Inpatient  Remains inpatient appropriate because:IV treatments appropriate due to intensity of illness or inability to take PO  Dispo: The patient is from: Home              Anticipated d/c is to: Home              Patient  currently is not medically stable to d/c.   Difficult to place patient No  DVT Prophylaxis  :    enoxaparin (LOVENOX) injection 40 mg Start: 09/10/20 2200 Place TED hose Start: 09/10/20 2129    Lab Results  Component Value Date   PLT 328 09/13/2020    Diet :  Diet Order             Diet Heart Room service appropriate? Yes; Fluid consistency: Thin  Diet effective now                    Inpatient Medications  Scheduled Meds:  albuterol  2 puff Inhalation Q6H   amLODipine  10 mg Oral Daily   azithromycin  250 mg Oral Daily   Chlorhexidine Gluconate Cloth  6 each Topical Q0600   enoxaparin (LOVENOX) injection  40 mg Subcutaneous Q24H   ipratropium  2 puff Inhalation Q6H   methylPREDNISolone (SOLU-MEDROL) injection  40 mg Intravenous Q12H   mometasone-formoterol  2 puff Inhalation BID   mupirocin ointment   Nasal BID   pantoprazole   80 mg Oral Daily   Continuous Infusions:   PRN Meds:.acetaminophen **OR** [DISCONTINUED] acetaminophen, hydrALAZINE, LORazepam, [DISCONTINUED] ondansetron **OR** ondansetron (ZOFRAN) IV, traZODone  Antibiotics  :    Anti-infectives (From admission, onward)    Start     Dose/Rate Route Frequency Ordered Stop   09/13/20 1015  azithromycin (ZITHROMAX) tablet 250 mg        250 mg Oral Daily 09/13/20 0929 09/15/20 0959   09/11/20 1800  azithromycin (ZITHROMAX) 500 mg in sodium chloride 0.9 % 250 mL IVPB        500 mg 250 mL/hr over 60 Minutes Intravenous Every 24 hours 09/10/20 2131 09/12/20 1850   09/10/20 1730  cefTRIAXone (ROCEPHIN) 2 g in sodium chloride 0.9 % 100 mL IVPB  Status:  Discontinued        2 g 200 mL/hr over 30 Minutes Intravenous Every 24 hours 09/10/20 1728 09/10/20 2131   09/10/20 1730  azithromycin (ZITHROMAX) 500 mg in sodium chloride 0.9 % 250 mL IVPB  Status:  Discontinued        500 mg 250 mL/hr over 60 Minutes Intravenous Every 24 hours 09/10/20 1728 09/10/20 2131        Time Spent in minutes  30   Susa Raring M.D on 09/13/2020 at 9:35 AM  To page go to [email or web address]   [facility] -  Office  [phone]     See all Orders from today for further details    Objective:   Vitals:   09/13/20 0500 09/13/20 0600 09/13/20 0700 09/13/20 0800  BP: 121/90 (!) 143/83 (!) 91/54 125/87  Pulse: 97 86 96 75  Resp: (!) 28 18 (!) 31 (!) 23  Temp:    97.9 F (36.6 C)  TempSrc:    Oral  SpO2: 96% 99% 96% 94%  Weight:      Height:        Wt Readings from Last 3 Encounters:  09/11/20 60.1 kg  06/01/20 63.4 kg  10/22/18 60.3 kg     Intake/Output Summary (Last 24 hours) at 09/13/2020 0935 Last data filed at 09/13/2020 0757 Gross per 24 hour  Intake 245 ml  Output 2450 ml  Net -2205 ml     Physical Exam  Awake Alert, No new F.N deficits, Normal affect [place].AT,PERRAL Supple Neck,No JVD, No cervical lymphadenopathy appriciated.  Symmetrical  Chest wall movement, moderate air movement bilaterally with improved  bilateral wheezing RRR,No Gallops, Rubs or new Murmurs, No Parasternal Heave +ve B.Sounds, Abd Soft, No tenderness, No organomegaly appriciated, No rebound - guarding or rigidity. No Cyanosis, Clubbing or edema, No new Rash or bruise    Data Review:    CBC Recent Labs  Lab 09/10/20 1728 09/11/20 0616 09/12/20 0432 09/13/20 0416  WBC 9.2 11.8* 14.8* 13.7*  HGB 13.1 12.4* 13.7 13.5  HCT 39.3 36.4* 41.7 40.8  PLT 289 256 306 328  MCV 92.7 89.7 91.9 91.5  MCH 30.9 30.5 30.2 30.3  MCHC 33.3 34.1 32.9 33.1  RDW 14.2 14.2 14.1 14.1  LYMPHSABS 0.5*  --  0.4* 0.3*  MONOABS 0.4  --  0.6 0.3  EOSABS 0.0  --  0.0 0.0  BASOSABS 0.0  --  0.0 0.0    Recent Labs  Lab 09/10/20 1728 09/10/20 2315 09/11/20 0616 09/12/20 0432 09/12/20 0642 09/13/20 0416  NA 134*  --  136 139  --  138  K 4.2  --  4.4 4.5  --  4.3  CL 97*  --  98 96*  --  94*  CO2 25  --  28 33*  --  34*  GLUCOSE 118*  --  117* 102*  --  137*  BUN 16  --  16 33*  --  51*  CREATININE 1.07  --  0.95 1.04  --  1.12  CALCIUM 9.1  --  8.8* 9.1  --  9.1  AST 27  --   --  41  --  27  ALT 22  --   --  34  --  31  ALKPHOS 75  --   --  72  --  71  BILITOT 1.1  --   --  1.0  --  0.7  ALBUMIN 3.6  --   --  3.4*  --  3.2*  MG  --   --  2.4 2.7*  --  2.8*  CRP  --   --  9.1*  --  7.9*  --   DDIMER  --   --  0.44 0.50  --  0.40  PROCALCITON <0.10  --   --  <0.10  --  <0.10  LATICACIDVEN 2.0* 2.7*  --   --   --   --   INR 1.0  --   --   --   --   --   BNP  --   --  212.0* 300.6*  --  138.6*    ------------------------------------------------------------------------------------------------------------------ No results for input(s): CHOL, HDL, LDLCALC, TRIG, CHOLHDL, LDLDIRECT in the last 72 hours.  No results found for: HGBA1C ------------------------------------------------------------------------------------------------------------------ No results for  input(s): TSH, T4TOTAL, T3FREE, THYROIDAB in the last 72 hours.  Invalid input(s): FREET3  Cardiac Enzymes No results for input(s): CKMB, TROPONINI, MYOGLOBIN in the last 168 hours.  Invalid input(s): CK ------------------------------------------------------------------------------------------------------------------    Component Value Date/Time   BNP 138.6 (H) 09/13/2020 0416     Radiology Reports CT Angio Chest Pulmonary Embolism (PE) W or WO Contrast  Result Date: 09/12/2020 CLINICAL DATA:  Suspected pulmonary embolism. EXAM: CT ANGIOGRAPHY CHEST WITH CONTRAST TECHNIQUE: Multidetector CT imaging of the chest was performed using the standard protocol during bolus administration of intravenous contrast. Multiplanar CT image reconstructions and MIPs were obtained to evaluate the vascular anatomy. CONTRAST:  71mL OMNIPAQUE IOHEXOL 350 MG/ML SOLN COMPARISON:  None. FINDINGS: Cardiovascular: There is mild to moderate severity calcification of the aortic arch. Satisfactory opacification of the pulmonary arteries to  the segmental level. No evidence of pulmonary embolism. Normal heart size with moderate severity coronary artery calcification. No pericardial effusion. Mediastinum/Nodes: Small, partially calcified pretracheal and bilateral hilar nodes are seen. Thyroid gland, trachea, and esophagus demonstrate no significant findings. Lungs/Pleura: There is marked severity emphysematous lung disease. Postoperative changes are seen on the right, consistent with the patient's history of partial right lobectomy. Subsequent right-sided volume loss is seen with left to right shift of the superior mediastinal structures. A 3.4 cm x 2.5 cm x 4.1 cm area of fluid attenuation, with a thin partially calcified surrounding wall, is seen within the posterior aspect of the upper right lung (approximately 21 Hounsfield units). This is also likely postoperative in origin. Mild to moderate severity areas of scarring and  atelectasis are seen along the left apex and posterior aspect of the bilateral lower lobes. Similar appearing changes are seen within the anterior aspect of the upper right lung. No pneumothorax is identified. Upper Abdomen: No acute abnormality. Musculoskeletal: Chronic and postoperative deformities are seen involving multiple right-sided ribs. Chronic fourth, fifth and sixth left rib fractures are seen. Review of the MIP images confirms the above findings. IMPRESSION: 1. No evidence of pulmonary embolism. 2. 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 3. Marked severity emphysematous lung disease with mild to moderate severity left apical and bilateral lower lobe scarring and/or atelectasis. Electronically Signed   By: Aram Candela M.D.   On: 09/12/2020 01:17   DG Chest Port 1 View  Result Date: 09/12/2020 CLINICAL DATA:  Respiratory distress. EXAM: PORTABLE CHEST 1 VIEW COMPARISON:  CTA chest from same day.  Chest x-ray from yesterday. FINDINGS: Normal heart size. Similar postsurgical changes, scarring, and volume loss in the right lung apex with rightward tracheal shift. Chronically coarsened interstitial markings again noted with lung hyperinflation and emphysematous changes. Similar reticulonodular densities at both lung bases. Similar scarring at the right costophrenic angle. No acute osseous abnormality. IMPRESSION: 1. Similar mild reticulonodular densities at both lung bases which may be infectious or inflammatory. 2. COPD with stable postsurgical changes, scarring, and volume loss in the right lung apex. Electronically Signed   By: Obie Dredge M.D.   On: 09/12/2020 07:41   DG Chest Port 1 View  Result Date: 09/11/2020 CLINICAL DATA:  Respiratory distress EXAM: PORTABLE CHEST 1 VIEW COMPARISON:  None. FINDINGS: Normal mediastinum and cardiac silhouette. There is volume loss in the RIGHT hemithorax related to prior surgery. There is  mild airspace disease at the lung bases. No pneumothorax. No pleural fluid. IMPRESSION: Bibasilar mild airspace disease suggests pulmonary edema. Postsurgical volume loss in the RIGHT hemithorax Electronically Signed   By: Genevive Bi M.D.   On: 09/11/2020 07:33   DG Chest Port 1 View  Result Date: 09/10/2020 CLINICAL DATA:  Possible sepsis, history of COVID-19 positivity EXAM: PORTABLE CHEST 1 VIEW COMPARISON:  11/21/2017 FINDINGS: Cardiac shadow is within normal limits. The lungs are hyperinflated. Persistent scarring and volume loss is noted in the right upper lobe related to prior surgery and stable. Will rib fractures are noted on the right with healing. Mild atelectasis versus scarring is noted in the left base. No confluent focal infiltrate is seen. Aortic calcifications are noted. IMPRESSION: Chronic appearing changes. COPD without acute abnormality. Electronically Signed   By: Alcide Clever M.D.   On: 09/10/2020 17:55   ECHOCARDIOGRAM COMPLETE  Result Date: 09/12/2020    ECHOCARDIOGRAM REPORT   Patient Name:   FREDERICK MARRO Date  of Exam: 09/11/2020 Medical Rec #:  [id number]     Height:       63.0 in Accession #:    [id number]    Weight:       132.5 lb Date of Birth:  1945/11/21    BSA:          1.623 m Patient Age:    74 years      BP:           170/94 mmHg Patient Gender: M             HR:           96 bpm. Exam Location:  [facility] Procedure: 2D Echo and Intracardiac Opacification Agent Indications:     CHF I50.31  History:         Patient has no prior history of Echocardiogram examinations.  Sonographer:     Overton Mam RDCS Referring Phys:  Effie Shy [facility] Diagnosing Phys: Lorine Bears MD  Sonographer Comments: Technically challenging study due to limited acoustic windows, suboptimal parasternal window, suboptimal apical window and suboptimal subcostal window. IMPRESSIONS  1. Left ventricular ejection fraction, by estimation, is 55 to 60%. The left ventricle has normal function. Left  ventricular endocardial border not optimally defined to evaluate regional wall motion. Left ventricular diastolic parameters are consistent with Grade I diastolic dysfunction (impaired relaxation).  2. Right ventricular systolic function is normal. The right ventricular size is normal. Tricuspid regurgitation signal is inadequate for assessing PA pressure.  3. The mitral valve was not well visualized. No evidence of mitral valve regurgitation. No evidence of mitral stenosis.  4. The aortic valve was not well visualized. Aortic valve regurgitation is not visualized. No aortic stenosis is present.  5. The inferior vena cava is dilated in size with >50% respiratory variability, suggesting right atrial pressure of 8 mmHg.  6. Challenging image quality. FINDINGS  Left Ventricle: Left ventricular ejection fraction, by estimation, is 55 to 60%. The left ventricle has normal function. Left ventricular endocardial border not optimally defined to evaluate regional wall motion. Definity contrast agent was given IV to delineate the left ventricular endocardial borders. The left ventricular internal cavity size was normal in size. There is no left ventricular hypertrophy. Left ventricular diastolic parameters are consistent with Grade I diastolic dysfunction (impaired relaxation). Right Ventricle: The right ventricular size is normal. No increase in right ventricular wall thickness. Right ventricular systolic function is normal. Tricuspid regurgitation signal is inadequate for assessing PA pressure. Left Atrium: Left atrial size was normal in size. Right Atrium: Right atrial size was normal in size. Pericardium: There is no evidence of pericardial effusion. Mitral Valve: The mitral valve was not well visualized. No evidence of mitral valve regurgitation. No evidence of mitral valve stenosis. Tricuspid Valve: The tricuspid valve is not well visualized. Tricuspid valve regurgitation is not demonstrated. No evidence of tricuspid  stenosis. Aortic Valve: The aortic valve was not well visualized. Aortic valve regurgitation is not visualized. No aortic stenosis is present. Aortic valve peak gradient measures 2.6 mmHg. Pulmonic Valve: The pulmonic valve was not well visualized. Pulmonic valve regurgitation is not visualized. No evidence of pulmonic stenosis. Aorta: The aortic root was not well visualized. Venous: The inferior vena cava is dilated in size with greater than 50% respiratory variability, suggesting right atrial pressure of 8 mmHg. IAS/Shunts: No atrial level shunt detected by color flow Doppler.  LEFT VENTRICLE PLAX 2D LVIDd:         3.86 cm Diastology  LVIDs:         2.67 cm LV Webster' lateral:   7.72 cm/s LV PW:         1.04 cm LV Webster/Webster' lateral: 6.1 LV IVS:        0.88 cm  LEFT ATRIUM           Index      RIGHT ATRIUM          Index LA Vol (A4C): 12.9 ml 7.95 ml/m RA Area:     7.62 cm                                  RA Volume:   15.90 ml 9.80 ml/m  AORTIC VALVE AV Vmax:      80.10 cm/s AV Peak Grad: 2.6 mmHg LVOT Vmax:    73.80 cm/s LVOT Vmean:   49.700 cm/s LVOT VTI:     0.104 m  AORTA Ao Root diam: 3.20 cm MITRAL VALVE MV Area (PHT): 2.36 cm    SHUNTS MV Decel Time: 322 msec    Systemic VTI: 0.10 m MV Webster velocity: 47.10 cm/s MV A velocity: 84.00 cm/s MV Webster/A ratio:  0.56 Lorine Bears MD Electronically signed by Lorine Bears MD Signature Date/Time: 09/12/2020/7:49:28 AM    Final

## 2020-09-14 ENCOUNTER — Inpatient Hospital Stay: Payer: Medicare Other

## 2020-09-14 DIAGNOSIS — J441 Chronic obstructive pulmonary disease with (acute) exacerbation: Secondary | ICD-10-CM | POA: Diagnosis not present

## 2020-09-14 LAB — CBC WITH DIFFERENTIAL/PLATELET
Abs Immature Granulocytes: 0.13 10*3/uL — ABNORMAL HIGH (ref 0.00–0.07)
Basophils Absolute: 0 10*3/uL (ref 0.0–0.1)
Basophils Relative: 0 %
Eosinophils Absolute: 0 10*3/uL (ref 0.0–0.5)
Eosinophils Relative: 0 %
HCT: 37.8 % — ABNORMAL LOW (ref 39.0–52.0)
Hemoglobin: 12.8 g/dL — ABNORMAL LOW (ref 13.0–17.0)
Immature Granulocytes: 1 %
Lymphocytes Relative: 2 %
Lymphs Abs: 0.3 10*3/uL — ABNORMAL LOW (ref 0.7–4.0)
MCH: 31.2 pg (ref 26.0–34.0)
MCHC: 33.9 g/dL (ref 30.0–36.0)
MCV: 92.2 fL (ref 80.0–100.0)
Monocytes Absolute: 0.7 10*3/uL (ref 0.1–1.0)
Monocytes Relative: 4 %
Neutro Abs: 14.9 10*3/uL — ABNORMAL HIGH (ref 1.7–7.7)
Neutrophils Relative %: 93 %
Platelets: 312 10*3/uL (ref 150–400)
RBC: 4.1 MIL/uL — ABNORMAL LOW (ref 4.22–5.81)
RDW: 13.9 % (ref 11.5–15.5)
WBC: 15.9 10*3/uL — ABNORMAL HIGH (ref 4.0–10.5)
nRBC: 0 % (ref 0.0–0.2)

## 2020-09-14 LAB — COMPREHENSIVE METABOLIC PANEL
ALT: 32 U/L (ref 0–44)
AST: 24 U/L (ref 15–41)
Albumin: 3.4 g/dL — ABNORMAL LOW (ref 3.5–5.0)
Alkaline Phosphatase: 70 U/L (ref 38–126)
Anion gap: 10 (ref 5–15)
BUN: 51 mg/dL — ABNORMAL HIGH (ref 8–23)
CO2: 31 mmol/L (ref 22–32)
Calcium: 9.3 mg/dL (ref 8.9–10.3)
Chloride: 96 mmol/L — ABNORMAL LOW (ref 98–111)
Creatinine, Ser: 1.05 mg/dL (ref 0.61–1.24)
GFR, Estimated: >60 mL/min (ref >60)
Glucose, Bld: 138 mg/dL — ABNORMAL HIGH (ref 70–99)
Potassium: 4.1 mmol/L (ref 3.5–5.1)
Sodium: 137 mmol/L (ref 135–145)
Total Bilirubin: 0.9 mg/dL (ref 0.3–1.2)
Total Protein: 6.6 g/dL (ref 6.5–8.1)

## 2020-09-14 LAB — C-REACTIVE PROTEIN: CRP: 2.9 mg/dL — ABNORMAL HIGH (ref <1.0)

## 2020-09-14 MED ORDER — AZITHROMYCIN 250 MG PO TABS
250.0000 mg | ORAL_TABLET | Freq: Every day | ORAL | Status: AC
  Administered 2020-09-15 – 2020-09-16 (×2): 250 mg via ORAL
  Filled 2020-09-14 (×2): qty 1

## 2020-09-14 MED ORDER — PAROXETINE HCL 10 MG PO TABS
10.0000 mg | ORAL_TABLET | Freq: Every day | ORAL | Status: DC
  Administered 2020-09-14 – 2020-09-23 (×10): 10 mg via ORAL
  Filled 2020-09-14 (×10): qty 1

## 2020-09-14 MED ORDER — NYSTATIN 100000 UNIT/ML MT SUSP
5.0000 mL | Freq: Four times a day (QID) | OROMUCOSAL | Status: DC
Start: 2020-09-14 — End: 2020-09-23
  Administered 2020-09-14 – 2020-09-23 (×36): 500000 [IU] via OROMUCOSAL
  Filled 2020-09-14 (×33): qty 5

## 2020-09-14 MED ORDER — FUROSEMIDE 40 MG PO TABS
40.0000 mg | ORAL_TABLET | Freq: Once | ORAL | Status: AC
  Administered 2020-09-14: 40 mg via ORAL
  Filled 2020-09-14: qty 1

## 2020-09-14 MED ORDER — LORAZEPAM 0.5 MG PO TABS
0.5000 mg | ORAL_TABLET | Freq: Two times a day (BID) | ORAL | Status: DC | PRN
  Administered 2020-09-14 – 2020-09-15 (×3): 0.5 mg via ORAL
  Filled 2020-09-14 (×4): qty 1

## 2020-09-14 NOTE — Evaluation (Signed)
Physical Therapy Evaluation Patient Details Name: Charles Webster MRN: [id number] DOB: 1946/01/03 Today's Date: 09/14/2020   History of Present Illness  75 y.o. male with medical history significant for COPD, chronic O2 supplementation, GERD, presents emergency department for chief concerns of shortness of breath.  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  Clinical Impression  Pt pleasant and willing to participate with PT, however his HR precluded doing very much.  Apparently he has been able to get to the commode with nursing but becomes extremely fatigued with this and repeatedly reports legs are extremely weak.  Just talking gathering PLOF his HR was in the 120-130 range and during very light strength testing/supine exercises his HR jumped to ~150 with c/o feeling poorly and stating "I don't want to give myself a heart attack."  Further activity deferred this date, notified RN, MD of vitals.  Will maintain on caseload and hope to complete mobility/gait assessment when medically appropriate.    Follow Up Recommendations SNF;Supervision/Assistance - 24 hour    Equipment Recommendations   (TBD at next venue of care)    Recommendations for Other Services       Precautions / Restrictions Precautions Precautions: Fall Precaution Comments: Cov Restrictions Weight Bearing Restrictions: No      Mobility  Bed Mobility               General bed mobility comments: deferred mobility due to elevated HR    Transfers                    Ambulation/Gait                Stairs            Wheelchair Mobility    Modified Rankin (Stroke Patients Only)       Balance                                             Pertinent Vitals/Pain Pain Assessment: No/denies pain    Home Living Family/patient expects to be discharged to:: Private residence Living Arrangements:  Alone Available Help at Discharge: Family;Available PRN/intermittently (multiple children live locally and can help)                  Prior Function Level of Independence: Independent         Comments: Pt reports that he is normally independent, drives, runs errands, etc w/o issue     Hand Dominance        Extremity/Trunk Assessment   Upper Extremity Assessment Upper Extremity Assessment: Generalized weakness    Lower Extremity Assessment Lower Extremity Assessment: Generalized weakness       Communication   Communication: No difficulties  Cognition Arousal/Alertness: Awake/alert Behavior During Therapy: WFL for tasks assessed/performed Overall Cognitive Status: Within Functional Limits for tasks assessed                                        General Comments General comments (skin integrity, edema, etc.): Pt feeling poorly, reports he would love to sit up but he reports HR has been elevated (110-160s) all day and was in the 120-130 range at rest during PLOF gathering  Exercises     Assessment/Plan    PT Assessment Patient needs continued PT services  PT Problem List Decreased strength;Decreased range of motion;Decreased activity tolerance;Decreased mobility;Decreased knowledge of use of DME;Decreased safety awareness;Cardiopulmonary status limiting activity       PT Treatment Interventions DME instruction;Gait training;Stair training;Functional mobility training;Therapeutic activities;Therapeutic exercise;Balance training;Patient/family education    PT Goals (Current goals can be found in the Care Plan section)  Acute Rehab PT Goals Patient Stated Goal: get breathing better PT Goal Formulation: With patient Time For Goal Achievement: 09/28/20 Potential to Achieve Goals: Fair    Frequency Min 2X/week   Barriers to discharge        Co-evaluation               AM-PAC PT "6 Clicks" Mobility  Outcome Measure Help needed  turning from your back to your side while in a flat bed without using bedrails?: A Little Help needed moving from lying on your back to sitting on the side of a flat bed without using bedrails?: A Little Help needed moving to and from a bed to a chair (including a wheelchair)?: A Lot Help needed standing up from a chair using your arms (e.g., wheelchair or bedside chair)?: A Lot Help needed to walk in hospital room?: Total Help needed climbing 3-5 steps with a railing? : Total 6 Click Score: 12    End of Session Equipment Utilized During Treatment: Oxygen (5L) Activity Tolerance: Patient limited by fatigue;Treatment limited secondary to medical complications (Comment) Patient left: with bed alarm set;with call bell/phone within reach Nurse Communication: Mobility status (HR elevating with very minimal activity) PT Visit Diagnosis: Muscle weakness (generalized) (M62.81);Difficulty in walking, not elsewhere classified (R26.2)    Time: 8182-9937 PT Time Calculation (min) (ACUTE ONLY): 44 min   Charges:   PT Evaluation $PT Eval Low Complexity: 1 Low          Malachi Pro, DPT 09/14/2020, 3:42 PM

## 2020-09-14 NOTE — Consult Note (Signed)
NAME:  Charles Webster, MRN:  [id number], DOB:  August 12, 1945, LOS: 3 ADMISSION DATE:  09/10/2020, CONSULTATION DATE:  09/11/2020 REFERRING MD: Susa Raring MD CHIEF COMPLAINT:  SOB    HPI  75 y.o with significant PMH of COPD on chronic home oxygen at 2L, Bronchitis, Diverticulitis, GERD and Hiatal hernia who presented to the ED with chief complaints of progressive shortness of breath.  Patient apparently tested positive for COVID on 09/06/2020 and was being treated at home with Paxlovid. He initially felt better but symptoms worsened with associated nonproductive cough x 2 days prompting him to come to the ED for further evaluation.  ED Course: On arrival to the ED, he was afebrile with blood pressure 191/122 mm Hg and pulse rate  103 beats/min, RR 40, Sats 96% on CPAP. There were no focal neurological deficits; he was alert and oriented x4, but visibly short of breath. Initial labs/Diagnostics revealed: WBC/Hgb/Plts:9.2/13.1/289 Na+/K+/Cl/Bicarb/BUN/Cr/Glucose: 134/4.2/97/25/16/1.27/118 EKG: showing sinus rhythm with rate of 98, QTc 447 CXR: Bibasilar mild airspace disease suggests pulmonary edema. UA: negtive Lactate: 2.0 Patient received Solu-Medrol by EMS, He also received  magnesium, bronchodilators  and was started on ceftriaxone and azithromycin, IV fluids for hydration, and placed on BiPAP for respiratory support. Sepsis work-up was negative, clear chest x-ray, procalcitonin negative with no signs of shock.  Patient was able to be weaned off of the BiPAP and admitted under hospitalist service.  Hospital Course: During the course of his admission, patient remained on BiPAP support with minimal improvement. This morning he was noted with increased work of breathing despite diuresis and BiPAP support. Due to concerns for clinical deterioration and possible requiring mechanical ventilation if he fails BiPAP, patient was transferred to the ICU and PCCM consulted. Labs obtained as  follows: Labs/Diagnostics WBC/Hgb/Hct/Plts:  15.9/12.8/37.8/312 (08/17 0512)  Arterial Blood Gas result:  pO2 140; pCO2 40; pH 7.46;  HCO3 28.4, %O2 Sat 99.3 Imaging / Other Labs: CRP 9.1, D-dimer 0.44, BNP 212,  Past Medical History    Bronchitis     COPD (chronic obstructive pulmonary disease) (HCC)     Diverticulitis     GERD (gastroesophageal reflux disease)     Hiatal hernia     Significant Hospital Events   COPD on chronic home oxygen at 2L Bronchitis Diverticulitis GERD  Hiatal hernia  Consults:  PCCM  Procedures:    Significant Diagnostic Tests:  8/14: Chest Xray>Bibasilar mild airspace disease suggests pulmonary edema. Postsurgical volume loss in the RIGHT hemithorax 8/14: CTA Chest> pending  Micro Data:   Influenza A PCR  Negative  Influenza B PCR Negative  SARS-CoV2 PCR Positive Abnormal       CC Follow up COPD  HPI Remains wheezing +_SOB +DOE +sore mouth +oral thrush     OBJECTIVE  Blood pressure (!) 142/71, pulse 75, temperature (!) 97.4 F (36.3 C), temperature source Oral, resp. rate (!) 22, height 5\' 3"  (1.6 m), weight 60.1 kg, SpO2 94 %.        Intake/Output Summary (Last 24 hours) at 09/14/2020 1609 Last data filed at 09/14/2020 0200 Gross per 24 hour  Intake --  Output 500 ml  Net -500 ml    Filed Weights   09/10/20 1724 09/11/20 1056  Weight: 65 kg 60.1 kg      Review of Systems:  Gen:  Denies  fever, sweats, chills weight loss  HEENT: sore in mouth, looks like thrush Cardiac:  No dizziness, chest pain or heaviness, chest tightness,edema, No JVD Resp:  No cough, -sputum production, +shortness of breath,+wheezing, -hemoptysis,  Other:  All other systems negative    Physical Examination:   General Appearance: +distress  EYES PERRLA, EOM intact.   NECK Supple, No JVD Pulmonary: +wheezing.  CardiovascularNormal S1,S2.  No m/r/g.   Abdomen: Benign, Soft, non-tender.   ALL OTHER ROS ARE NEGATIVE   Labs/imaging  that I havepersonally reviewed  (right click and "Reselect all SmartList Selections" daily)     Labs   CBC: Recent Labs  Lab 09/10/20 1728 09/11/20 0616 09/12/20 0432 09/13/20 0416 09/14/20 0512  WBC 9.2 11.8* 14.8* 13.7* 15.9*  NEUTROABS 8.2*  --  13.7* 13.1* 14.9*  HGB 13.1 12.4* 13.7 13.5 12.8*  HCT 39.3 36.4* 41.7 40.8 37.8*  MCV 92.7 89.7 91.9 91.5 92.2  PLT 289 256 306 328 312     Basic Metabolic Panel: Recent Labs  Lab 09/10/20 1728 09/11/20 0616 09/12/20 0432 09/13/20 0416 09/14/20 0512  NA 134* 136 139 138 137  K 4.2 4.4 4.5 4.3 4.1  CL 97* 98 96* 94* 96*  CO2 25 28 33* 34* 31  GLUCOSE 118* 117* 102* 137* 138*  BUN 16 16 33* 51* 51*  CREATININE 1.07 0.95 1.04 1.12 1.05  CALCIUM 9.1 8.8* 9.1 9.1 9.3  MG  --  2.4 2.7* 2.8*  --   PHOS  --   --  4.1 4.8*  --     GFR: Estimated Creatinine Clearance: 48.9 mL/min (by C-G formula based on SCr of 1.05 mg/dL). Recent Labs  Lab 09/10/20 1728 09/10/20 2315 09/11/20 0616 09/12/20 0432 09/13/20 0416 09/14/20 0512  PROCALCITON <0.10  --   --  <0.10 <0.10  --   WBC 9.2  --  11.8* 14.8* 13.7* 15.9*  LATICACIDVEN 2.0* 2.7*  --   --   --   --      Liver Function Tests: Recent Labs  Lab 09/10/20 1728 09/12/20 0432 09/13/20 0416 09/14/20 0512  AST 27 41 27 24  ALT 22 34 31 32  ALKPHOS 75 72 71 70  BILITOT 1.1 1.0 0.7 0.9  PROT 7.0 6.7 7.1 6.6  ALBUMIN 3.6 3.4* 3.2* 3.4*    No results for input(s): LIPASE, AMYLASE in the last 168 hours. No results for input(s): AMMONIA in the last 168 hours.  ABG    Component Value Date/Time   PHART 7.46 (H) 09/11/2020 0818   PCO2ART 40 09/11/2020 0818   PO2ART 140 (H) 09/11/2020 0818   HCO3 28.4 (H) 09/11/2020 0818   O2SAT 99.3 09/11/2020 0818      Coagulation Profile: Recent Labs  Lab 09/10/20 1728  INR 1.0     Cardiac Enzymes: No results for input(s): CKTOTAL, CKMB, CKMBINDEX, TROPONINI in the last 168 hours.  HbA1C: No results found for:  HGBA1C  CBG: Recent Labs  Lab 09/11/20 1049  GLUCAP 108*    Allergies Allergies  Allergen Reactions   Alpha Blocker Quinazolines    Clindamycin/Lincomycin Nausea Only   Doxycycline Other (See Comments)   Levaquin [Levofloxacin]    Penicillins    Singulair [Montelukast Sodium] Other (See Comments)    Flu like symptoms   Sulfa Antibiotics Other (See Comments)   Tamsulosin Other (See Comments)     ASSESSMENT & PLAN  Acute on Chronic Hypoxic Respiratory Failure secondary to COPD exacerbation & COVID Infection & Possible Pulmonary Edema PMHx: COPD Oxygen as needed Continue steroids ALB MDI q4 -Ensure adequate pulmonary hygiene   SEVERE COPD EXACERBATION -continue IV steroids as prescribed -morphine  as needed -wean fio2 as needed and tolerated     COVID-19 Infection Patient will need significant amount of time to recover -Supplemental O2 as needed to maintain O2 saturations 88 to 92%  ORAL THRUSH RECOMMEND NYASTSTIN ANTITUSSIVE as needed   Prognosis is guarded     Lucie Leather, M.D.  [facility]  Medical Director [facility] Medical Director [facility] Cardio-Pulmonary Department

## 2020-09-14 NOTE — Progress Notes (Signed)
PROGRESS NOTE                                                                                                                                                                                                             Patient Demographics:    Charles Webster, is a 75 y.o. male, DOB - Feb 01, 1945, XLK:[id number]  Outpatient Primary MD for the patient is Webster, Charles Guthrie, MD    LOS - 3  Admit date - 09/10/2020    Chief Complaint  Patient presents with   Respiratory Distress       Brief Narrative (HPI from H&P)    Charles Webster is a 75 y.o. male with medical history significant for COPD, chronic O2 supplementation, GERD, presents emergency department for chief concerns of shortness of breath.  ER he was diagnosed with acute hypoxic respiratory failure due to COPD exacerbation, he was placed on BiPAP and admitted to the hospital.   Subjective:   Patient in bed denies any headache chest or abdominal pain, breathing has improved but does have some generalized weakness.  He is feeling little sad that he is confined to the hospital bed.  Not suicidal homicidal.   Assessment  & Plan :     Acute Hypoxic Resp. Failure due to Acute on chronic COPD exacerbation along with acute on chronic CHF nonspecific, recent COVID-19 infection - he has most likely combination of COPD and CHF exacerbation, he with IV Lasix along with IV steroids and azithromycin for atypical coverage, initially on BiPAP for a few days now on 5 L nasal cannula oxygen, of note he uses oxygen at home as well at baseline, case discussed with pulmonologist Dr. Belia Heman on 09/13/2020.  No change in plan.  Continue IV steroids and azithromycin for a few more days, have continued Lasix but at her lower dose today.  Encouraged the patient to sit up in chair in the daytime use I-S and flutter valve for pulmonary toiletry.  Will advance activity and titrate down oxygen as  possible.   2.  Recent COVID-19 infection in a patient who is fully vaccinated and was treated with Paxil await.  Monitor inflammatory markers for now steroids should suffice.  I think main issue right now is COPD and CHF exacerbation.  3.  Hypertension.  Placed on Norvasc.  Monitor.  4.  Acute  on diastolic CHF with EF 50%.  Echo noted, continue Lasix on a as needed basis, chest pain-free and does have orthopnea..  5.  Recent right upper extremity wound present on admission.  Wound care team consulted.  6.  GERD.  On PPI.  7.  Mild Depression.  Placed on Paxil.    8. Diarrhea present on admission.  Better, says he gets Diarrhea on ABX, monitor.   Recent Labs  Lab 09/10/20 1728 09/10/20 2315 09/11/20 0616 09/12/20 0432 09/12/20 1610 09/13/20 0416 09/14/20 0512 09/14/20 0523  WBC 9.2  --  11.8* 14.8*  --  13.7* 15.9*  --   HGB 13.1  --  12.4* 13.7  --  13.5 12.8*  --   HCT 39.3  --  36.4* 41.7  --  40.8 37.8*  --   PLT 289  --  256 306  --  328 312  --   CRP  --   --  9.1*  --  7.9* 6.3*  --  2.9*  BNP  --   --  212.0* 300.6*  --  138.6*  --   --   DDIMER  --   --  0.44 0.50  --  0.40  --   --   PROCALCITON <0.10  --   --  <0.10  --  <0.10  --   --   AST 27  --   --  41  --  27 24  --   ALT 22  --   --  34  --  31 32  --   ALKPHOS 75  --   --  72  --  71 70  --   BILITOT 1.1  --   --  1.0  --  0.7 0.9  --   ALBUMIN 3.6  --   --  3.4*  --  3.2* 3.4*  --   INR 1.0  --   --   --   --   --   --   --   LATICACIDVEN 2.0* 2.7*  --   --   --   --   --   --          Condition - Extremely Guarded  Family Communication  :  son over the phone 09/11/20, message left for son Major [phone] on his cell phone on 09/12/2020 at 10 AM, called 09/13/20   Code Status :  Full  Consults  :  PCCM  PUD Prophylaxis : PPI   Procedures  :     CTA -  1. No evidence of pulmonary embolism. 2. Extensive postoperative changes consistent with the patient's history of prior partial right  lobectomy. 3. Marked severity emphysematous lung disease with mild to moderate severity left apical and bilateral lower lobe scarring and/or atelectasis  TTE -  1. Left ventricular ejection fraction, by estimation, is 55 to 60%. The left ventricle has normal function. Left ventricular endocardial border not optimally defined to evaluate regional wall motion. Left ventricular diastolic parameters are consistent with Grade I diastolic dysfunction (impaired relaxation).  2. Right ventricular systolic function is normal. The right ventricular size is normal. Tricuspid regurgitation signal is inadequate for assessing PA pressure.  3. The mitral valve was not well visualized. No evidence of mitral valve regurgitation. No evidence of mitral stenosis.  4. The aortic valve was not well visualized. Aortic valve regurgitation is not visualized. No aortic stenosis is present.  5. The inferior vena cava is dilated in  size with >50% respiratory variability, suggesting right atrial pressure of 8 mmHg.  6. Challenging image quality.      Disposition Plan  :    Status is: Inpatient  Remains inpatient appropriate because:IV treatments appropriate due to intensity of illness or inability to take PO  Dispo: The patient is from: Home              Anticipated d/c is to: Home              Patient currently is not medically stable to d/c.   Difficult to place patient No  DVT Prophylaxis  :    enoxaparin (LOVENOX) injection 40 mg Start: 09/10/20 2200 Place TED hose Start: 09/10/20 2129    Lab Results  Component Value Date   PLT 312 09/14/2020    Diet :  Diet Order             Diet Heart Room service appropriate? Yes; Fluid consistency: Thin  Diet effective now                    Inpatient Medications  Scheduled Meds:  albuterol  2 puff Inhalation Q6H   amLODipine  10 mg Oral Daily   enoxaparin (LOVENOX) injection  40 mg Subcutaneous Q24H   ipratropium  2 puff Inhalation Q6H    methylPREDNISolone (SOLU-MEDROL) injection  40 mg Intravenous Q12H   mometasone-formoterol  2 puff Inhalation BID   mupirocin ointment   Nasal BID   pantoprazole  80 mg Oral Daily   Continuous Infusions:   PRN Meds:.hydrALAZINE, LORazepam, [DISCONTINUED] ondansetron **OR** ondansetron (ZOFRAN) IV, traZODone  Antibiotics  :    Anti-infectives (From admission, onward)    Start     Dose/Rate Route Frequency Ordered Stop   09/13/20 1015  azithromycin (ZITHROMAX) tablet 250 mg        250 mg Oral Daily 09/13/20 0929 09/14/20 0952   09/11/20 1800  azithromycin (ZITHROMAX) 500 mg in sodium chloride 0.9 % 250 mL IVPB        500 mg 250 mL/hr over 60 Minutes Intravenous Every 24 hours 09/10/20 2131 09/12/20 1850   09/10/20 1730  cefTRIAXone (ROCEPHIN) 2 g in sodium chloride 0.9 % 100 mL IVPB  Status:  Discontinued        2 g 200 mL/hr over 30 Minutes Intravenous Every 24 hours 09/10/20 1728 09/10/20 2131   09/10/20 1730  azithromycin (ZITHROMAX) 500 mg in sodium chloride 0.9 % 250 mL IVPB  Status:  Discontinued        500 mg 250 mL/hr over 60 Minutes Intravenous Every 24 hours 09/10/20 1728 09/10/20 2131        Time Spent in minutes  30   Susa RaringPrashant Yamilet Mcfayden M.D on 09/14/2020 at 11:22 AM  To page go to [email or web address]   [facility] -  Office  [phone]     See all Orders from today for further details    Objective:   Vitals:   09/13/20 1800 09/13/20 2017 09/14/20 0300 09/14/20 0851  BP: 134/84 (!) 150/89 137/84 116/87  Pulse: 90 93 80 95  Resp: (!) 25 (!) 22 (!) 22   Temp:  (!) 97.4 F (36.3 C) 97.8 F (36.6 C) (!) 97.4 F (36.3 C)  TempSrc:  Oral Oral Oral  SpO2: 98% 95% 97% 96%  Weight:      Height:        Wt Readings from Last 3 Encounters:  09/11/20 60.1 kg  06/01/20 63.4 kg  10/22/18 60.3 kg     Intake/Output Summary (Last 24 hours) at 09/14/2020 1122 Last data filed at 09/14/2020 0200 Gross per 24 hour  Intake 180 ml  Output 730 ml  Net -550 ml      Physical Exam  Awake Alert, No new F.N deficits, Normal affect [place].AT,PERRAL Supple Neck,No JVD, No cervical lymphadenopathy appriciated.  Symmetrical Chest wall movement, Good air movement bilaterally, mild bilateral wheezing RRR,No Gallops, Rubs or new Murmurs, No Parasternal Heave +ve B.Sounds, Abd Soft, No tenderness, No organomegaly appriciated, No rebound - guarding or rigidity. No Cyanosis, Clubbing or edema, No new Rash or bruise     Data Review:    CBC Recent Labs  Lab 09/10/20 1728 09/11/20 0616 09/12/20 0432 09/13/20 0416 09/14/20 0512  WBC 9.2 11.8* 14.8* 13.7* 15.9*  HGB 13.1 12.4* 13.7 13.5 12.8*  HCT 39.3 36.4* 41.7 40.8 37.8*  PLT 289 256 306 328 312  MCV 92.7 89.7 91.9 91.5 92.2  MCH 30.9 30.5 30.2 30.3 31.2  MCHC 33.3 34.1 32.9 33.1 33.9  RDW 14.2 14.2 14.1 14.1 13.9  LYMPHSABS 0.5*  --  0.4* 0.3* 0.3*  MONOABS 0.4  --  0.6 0.3 0.7  EOSABS 0.0  --  0.0 0.0 0.0  BASOSABS 0.0  --  0.0 0.0 0.0    Recent Labs  Lab 09/10/20 1728 09/10/20 2315 09/11/20 0616 09/12/20 0432 09/12/20 0642 09/13/20 0416 09/14/20 0512 09/14/20 0523  NA 134*  --  136 139  --  138 137  --   K 4.2  --  4.4 4.5  --  4.3 4.1  --   CL 97*  --  98 96*  --  94* 96*  --   CO2 25  --  28 33*  --  34* 31  --   GLUCOSE 118*  --  117* 102*  --  137* 138*  --   BUN 16  --  16 33*  --  51* 51*  --   CREATININE 1.07  --  0.95 1.04  --  1.12 1.05  --   CALCIUM 9.1  --  8.8* 9.1  --  9.1 9.3  --   AST 27  --   --  41  --  27 24  --   ALT 22  --   --  34  --  31 32  --   ALKPHOS 75  --   --  72  --  71 70  --   BILITOT 1.1  --   --  1.0  --  0.7 0.9  --   ALBUMIN 3.6  --   --  3.4*  --  3.2* 3.4*  --   MG  --   --  2.4 2.7*  --  2.8*  --   --   CRP  --   --  9.1*  --  7.9* 6.3*  --  2.9*  DDIMER  --   --  0.44 0.50  --  0.40  --   --   PROCALCITON <0.10  --   --  <0.10  --  <0.10  --   --   LATICACIDVEN 2.0* 2.7*  --   --   --   --   --   --   INR 1.0  --   --   --   --   --    --   --   BNP  --   --  212.0* 300.6*  --  138.6*  --   --     ------------------------------------------------------------------------------------------------------------------ No results for input(s): CHOL, HDL, LDLCALC, TRIG, CHOLHDL, LDLDIRECT in the last 72 hours.  No results found for: HGBA1C ------------------------------------------------------------------------------------------------------------------ No results for input(s): TSH, T4TOTAL, T3FREE, THYROIDAB in the last 72 hours.  Invalid input(s): FREET3  Cardiac Enzymes No results for input(s): CKMB, TROPONINI, MYOGLOBIN in the last 168 hours.  Invalid input(s): CK ------------------------------------------------------------------------------------------------------------------    Component Value Date/Time   BNP 138.6 (H) 09/13/2020 0416     Radiology Reports CT Angio Chest Pulmonary Embolism (PE) W or WO Contrast  Result Date: 09/12/2020 CLINICAL DATA:  Suspected pulmonary embolism. EXAM: CT ANGIOGRAPHY CHEST WITH CONTRAST TECHNIQUE: Multidetector CT imaging of the chest was performed using the standard protocol during bolus administration of intravenous contrast. Multiplanar CT image reconstructions and MIPs were obtained to evaluate the vascular anatomy. CONTRAST:  62mL OMNIPAQUE IOHEXOL 350 MG/ML SOLN COMPARISON:  None. FINDINGS: Cardiovascular: There is mild to moderate severity calcification of the aortic arch. Satisfactory opacification of the pulmonary arteries to the segmental level. No evidence of pulmonary embolism. Normal heart size with moderate severity coronary artery calcification. No pericardial effusion. Mediastinum/Nodes: Small, partially calcified pretracheal and bilateral hilar nodes are seen. Thyroid gland, trachea, and esophagus demonstrate no significant findings. Lungs/Pleura: There is marked severity emphysematous lung disease. Postoperative changes are seen on the right, consistent with the patient's  history of partial right lobectomy. Subsequent right-sided volume loss is seen with left to right shift of the superior mediastinal structures. A 3.4 cm x 2.5 cm x 4.1 cm area of fluid attenuation, with a thin partially calcified surrounding wall, is seen within the posterior aspect of the upper right lung (approximately 21 Hounsfield units). This is also likely postoperative in origin. Mild to moderate severity areas of scarring and atelectasis are seen along the left apex and posterior aspect of the bilateral lower lobes. Similar appearing changes are seen within the anterior aspect of the upper right lung. No pneumothorax is identified. Upper Abdomen: No acute abnormality. Musculoskeletal: Chronic and postoperative deformities are seen involving multiple right-sided ribs. Chronic fourth, fifth and sixth left rib fractures are seen. Review of the MIP images confirms the above findings. IMPRESSION: 1. No evidence of pulmonary embolism. 2. 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 3. Marked severity emphysematous lung disease with mild to moderate severity left apical and bilateral lower lobe scarring and/or atelectasis. Electronically Signed   By: Aram Candela M.D.   On: 09/12/2020 01:17   DG Chest Port 1 View  Result Date: 09/14/2020 CLINICAL DATA:  Shortness of breath; history COPD, bronchitis, GERD, hiatal hernia, former smoker EXAM: PORTABLE CHEST 1 VIEW COMPARISON:  Portable exam 0752 hours compared to 09/12/2020 radiographic and CT angio chest exams FINDINGS: Normal heart size and pulmonary vascularity. Volume loss in RIGHT hemithorax with mediastinal shift to the RIGHT. Atherosclerotic calcification aorta. Postsurgical changes and marked volume loss in RIGHT upper lobe with persistent pleuroparenchymal thickening at apex. Underlying emphysematous and chronic bronchitic changes. Bibasilar interstitial infiltrates versus chronic  interstitial lung disease. Calcified granulomata LEFT upper lobe. Remaining lungs clear. Trace RIGHT pleural effusion. No pneumothorax. Bones demineralized with old fractures of the anterior LEFT fourth fifth and sixth ribs. IMPRESSION: Postoperative changes of the upper RIGHT hemithorax with volume loss in RIGHT chest. COPD changes with RIGHT upper lobe scarring. Bibasilar shall infiltrates versus chronic interstitial lung disease unchanged. Aortic Atherosclerosis (ICD10-I70.0) and Emphysema (ICD10-J43.9). Electronically Signed   By: Loraine Leriche  Tyron Russell M.D.   On: 09/14/2020 08:31   DG Chest Port 1 View  Result Date: 09/12/2020 CLINICAL DATA:  Respiratory distress. EXAM: PORTABLE CHEST 1 VIEW COMPARISON:  CTA chest from same day.  Chest x-ray from yesterday. FINDINGS: Normal heart size. Similar postsurgical changes, scarring, and volume loss in the right lung apex with rightward tracheal shift. Chronically coarsened interstitial markings again noted with lung hyperinflation and emphysematous changes. Similar reticulonodular densities at Webster lung bases. Similar scarring at the right costophrenic angle. No acute osseous abnormality. IMPRESSION: 1. Similar mild reticulonodular densities at Webster lung bases which may be infectious or inflammatory. 2. COPD with stable postsurgical changes, scarring, and volume loss in the right lung apex. Electronically Signed   By: Obie Dredge M.D.   On: 09/12/2020 07:41   DG Chest Port 1 View  Result Date: 09/11/2020 CLINICAL DATA:  Respiratory distress EXAM: PORTABLE CHEST 1 VIEW COMPARISON:  None. FINDINGS: Normal mediastinum and cardiac silhouette. There is volume loss in the RIGHT hemithorax related to prior surgery. There is mild airspace disease at the lung bases. No pneumothorax. No pleural fluid. IMPRESSION: Bibasilar mild airspace disease suggests pulmonary edema. Postsurgical volume loss in the RIGHT hemithorax Electronically Signed   By: Genevive Bi M.D.   On:  09/11/2020 07:33   DG Chest Port 1 View  Result Date: 09/10/2020 CLINICAL DATA:  Possible sepsis, history of COVID-19 positivity EXAM: PORTABLE CHEST 1 VIEW COMPARISON:  11/21/2017 FINDINGS: Cardiac shadow is within normal limits. The lungs are hyperinflated. Persistent scarring and volume loss is noted in the right upper lobe related to prior surgery and stable. Will rib fractures are noted on the right with healing. Mild atelectasis versus scarring is noted in the left base. No confluent focal infiltrate is seen. Aortic calcifications are noted. IMPRESSION: Chronic appearing changes. COPD without acute abnormality. Electronically Signed   By: Alcide Clever M.D.   On: 09/10/2020 17:55   ECHOCARDIOGRAM COMPLETE  Result Date: 09/12/2020    ECHOCARDIOGRAM REPORT   Patient Name:   TAVARUS POTEETE Date of Exam: 09/11/2020 Medical Rec #:  [id number]     Height:       63.0 in Accession #:    [id number]    Weight:       132.5 lb Date of Birth:  Sep 22, 1945    BSA:          1.623 m Patient Age:    74 years      BP:           170/94 mmHg Patient Gender: M             HR:           96 bpm. Exam Location:  [facility] Procedure: 2D Echo and Intracardiac Opacification Agent Indications:     CHF I50.31  History:         Patient has no prior history of Echocardiogram examinations.  Sonographer:     Overton Mam RDCS Referring Phys:  Effie Shy [facility] Diagnosing Phys: Lorine Bears MD  Sonographer Comments: Technically challenging study due to limited acoustic windows, suboptimal parasternal window, suboptimal apical window and suboptimal subcostal window. IMPRESSIONS  1. Left ventricular ejection fraction, by estimation, is 55 to 60%. The left ventricle has normal function. Left ventricular endocardial border not optimally defined to evaluate regional wall motion. Left ventricular diastolic parameters are consistent with Grade I diastolic dysfunction (impaired relaxation).  2. Right ventricular systolic function is normal.  The right ventricular size  is normal. Tricuspid regurgitation signal is inadequate for assessing PA pressure.  3. The mitral valve was not well visualized. No evidence of mitral valve regurgitation. No evidence of mitral stenosis.  4. The aortic valve was not well visualized. Aortic valve regurgitation is not visualized. No aortic stenosis is present.  5. The inferior vena cava is dilated in size with >50% respiratory variability, suggesting right atrial pressure of 8 mmHg.  6. Challenging image quality. FINDINGS  Left Ventricle: Left ventricular ejection fraction, by estimation, is 55 to 60%. The left ventricle has normal function. Left ventricular endocardial border not optimally defined to evaluate regional wall motion. Definity contrast agent was given IV to delineate the left ventricular endocardial borders. The left ventricular internal cavity size was normal in size. There is no left ventricular hypertrophy. Left ventricular diastolic parameters are consistent with Grade I diastolic dysfunction (impaired relaxation). Right Ventricle: The right ventricular size is normal. No increase in right ventricular wall thickness. Right ventricular systolic function is normal. Tricuspid regurgitation signal is inadequate for assessing PA pressure. Left Atrium: Left atrial size was normal in size. Right Atrium: Right atrial size was normal in size. Pericardium: There is no evidence of pericardial effusion. Mitral Valve: The mitral valve was not well visualized. No evidence of mitral valve regurgitation. No evidence of mitral valve stenosis. Tricuspid Valve: The tricuspid valve is not well visualized. Tricuspid valve regurgitation is not demonstrated. No evidence of tricuspid stenosis. Aortic Valve: The aortic valve was not well visualized. Aortic valve regurgitation is not visualized. No aortic stenosis is present. Aortic valve peak gradient measures 2.6 mmHg. Pulmonic Valve: The pulmonic valve was not well visualized.  Pulmonic valve regurgitation is not visualized. No evidence of pulmonic stenosis. Aorta: The aortic root was not well visualized. Venous: The inferior vena cava is dilated in size with greater than 50% respiratory variability, suggesting right atrial pressure of 8 mmHg. IAS/Shunts: No atrial level shunt detected by color flow Doppler.  LEFT VENTRICLE PLAX 2D LVIDd:         3.86 cm Diastology LVIDs:         2.67 cm LV e' lateral:   7.72 cm/s LV PW:         1.04 cm LV E/e' lateral: 6.1 LV IVS:        0.88 cm  LEFT ATRIUM           Index      RIGHT ATRIUM          Index LA Vol (A4C): 12.9 ml 7.95 ml/m RA Area:     7.62 cm                                  RA Volume:   15.90 ml 9.80 ml/m  AORTIC VALVE AV Vmax:      80.10 cm/s AV Peak Grad: 2.6 mmHg LVOT Vmax:    73.80 cm/s LVOT Vmean:   49.700 cm/s LVOT VTI:     0.104 m  AORTA Ao Root diam: 3.20 cm MITRAL VALVE MV Area (PHT): 2.36 cm    SHUNTS MV Decel Time: 322 msec    Systemic VTI: 0.10 m MV E velocity: 47.10 cm/s MV A velocity: 84.00 cm/s MV E/A ratio:  0.56 Lorine Bears MD Electronically signed by Lorine Bears MD Signature Date/Time: 09/12/2020/7:49:28 AM    Final

## 2020-09-15 DIAGNOSIS — J441 Chronic obstructive pulmonary disease with (acute) exacerbation: Secondary | ICD-10-CM | POA: Diagnosis not present

## 2020-09-15 LAB — CBC WITH DIFFERENTIAL/PLATELET
Abs Immature Granulocytes: 0.24 10*3/uL — ABNORMAL HIGH (ref 0.00–0.07)
Basophils Absolute: 0 10*3/uL (ref 0.0–0.1)
Basophils Relative: 0 %
Eosinophils Absolute: 0 10*3/uL (ref 0.0–0.5)
Eosinophils Relative: 0 %
HCT: 41.8 % (ref 39.0–52.0)
Hemoglobin: 14 g/dL (ref 13.0–17.0)
Immature Granulocytes: 1 %
Lymphocytes Relative: 2 %
Lymphs Abs: 0.4 10*3/uL — ABNORMAL LOW (ref 0.7–4.0)
MCH: 31.4 pg (ref 26.0–34.0)
MCHC: 33.5 g/dL (ref 30.0–36.0)
MCV: 93.7 fL (ref 80.0–100.0)
Monocytes Absolute: 0.8 10*3/uL (ref 0.1–1.0)
Monocytes Relative: 5 %
Neutro Abs: 16.3 10*3/uL — ABNORMAL HIGH (ref 1.7–7.7)
Neutrophils Relative %: 92 %
Platelets: 349 10*3/uL (ref 150–400)
RBC: 4.46 MIL/uL (ref 4.22–5.81)
RDW: 13.8 % (ref 11.5–15.5)
WBC: 17.8 10*3/uL — ABNORMAL HIGH (ref 4.0–10.5)
nRBC: 0 % (ref 0.0–0.2)

## 2020-09-15 LAB — COMPREHENSIVE METABOLIC PANEL
ALT: 36 U/L (ref 0–44)
AST: 29 U/L (ref 15–41)
Albumin: 3.5 g/dL (ref 3.5–5.0)
Alkaline Phosphatase: 73 U/L (ref 38–126)
Anion gap: 12 (ref 5–15)
BUN: 49 mg/dL — ABNORMAL HIGH (ref 8–23)
CO2: 30 mmol/L (ref 22–32)
Calcium: 9.5 mg/dL (ref 8.9–10.3)
Chloride: 95 mmol/L — ABNORMAL LOW (ref 98–111)
Creatinine, Ser: 0.99 mg/dL (ref 0.61–1.24)
GFR, Estimated: >60 mL/min (ref >60)
Glucose, Bld: 130 mg/dL — ABNORMAL HIGH (ref 70–99)
Potassium: 4.6 mmol/L (ref 3.5–5.1)
Sodium: 137 mmol/L (ref 135–145)
Total Bilirubin: 0.8 mg/dL (ref 0.3–1.2)
Total Protein: 6.8 g/dL (ref 6.5–8.1)

## 2020-09-15 LAB — CULTURE, BLOOD (ROUTINE X 2)
Culture: NO GROWTH
Culture: NO GROWTH
Special Requests: ADEQUATE

## 2020-09-15 LAB — C-REACTIVE PROTEIN: CRP: 1.3 mg/dL — ABNORMAL HIGH (ref <1.0)

## 2020-09-15 MED ORDER — FUROSEMIDE 40 MG PO TABS
60.0000 mg | ORAL_TABLET | Freq: Once | ORAL | Status: AC
  Administered 2020-09-15: 60 mg via ORAL
  Filled 2020-09-15: qty 1

## 2020-09-15 NOTE — Progress Notes (Signed)
PROGRESS NOTE                                                                                                                                                                                                             Patient Demographics:    Charles Webster, is a 75 y.o. male, DOB - [birth year], IWP:[id number]  Outpatient Primary MD for the patient is Feldpausch, Madaline Guthrie, MD    LOS - 4  Admit date - 09/10/2020    Chief Complaint  Patient presents with   Respiratory Distress       Brief Narrative (HPI from H&P)    Charles Webster is a 75 y.o. male with medical history significant for COPD, chronic O2 supplementation, GERD, presents emergency department for chief concerns of shortness of breath.  ER he was diagnosed with acute hypoxic respiratory failure due to COPD exacerbation, he was placed on BiPAP and admitted to the hospital.   Subjective:   Patient in bed, appears comfortable, denies any headache, no fever, no chest pain or pressure, still has shortness of breath , no abdominal pain. No new focal weakness, feels anxious.   Assessment  & Plan :     Acute Hypoxic Resp. Failure due to Acute on chronic COPD exacerbation along with acute on chronic CHF nonspecific, recent COVID-19 infection - he has most likely combination of COPD and CHF exacerbation, he with IV Lasix along with IV steroids and azithromycin for atypical coverage, initially on BiPAP for a few days now on 5 L nasal cannula oxygen, of note he uses oxygen at home as well at baseline, case discussed with patient's pulmonologist Charles Webster on 09/14/2020 - terminal and advanced COPD, very frail and deconditioned at baseline- per pulmonary appropriate for palliative care to be called for goals of care discussion, continue IV steroids and azithromycin for a few more days, have continued Lasix but now on daily as-needed basis.  Encouraged the patient to sit up in chair in the  daytime use I-S and flutter valve for pulmonary toiletry.  Will advance activity and titrate down oxygen as possible.   2.  Recent COVID-19 infection in a patient who is fully vaccinated and was treated with Paxil await.  Monitor inflammatory markers for now steroids should suffice.  I think main issue right now is COPD and CHF exacerbation.  3.  Hypertension.  Placed on Norvasc.  Monitor.  4.  Acute on diastolic CHF with EF 50%.  Echo noted, continue Lasix on a as needed basis, chest pain-free and does have orthopnea..  5.  Recent right upper extremity wound present on admission.  Wound care team consulted.  6.  GERD.  On PPI.  7.  Mild Depression.  Placed on Paxil.    8. Diarrhea present on admission.  Better, says he gets Diarrhea on ABX, monitor.   Recent Labs  Lab 09/10/20 1728 09/10/20 2315 09/11/20 [id number] 09/12/20 0432 09/12/20 [id number] 09/13/20 0416 09/14/20 0512 09/14/20 0523 09/15/20 0704  WBC 9.2  --  11.8* 14.8*  --  13.7* 15.9*  --  17.8*  HGB 13.1  --  12.4* 13.7  --  13.5 12.8*  --  14.0  HCT 39.3  --  36.4* 41.7  --  40.8 37.8*  --  41.8  PLT 289  --  256 306  --  328 312  --  349  CRP  --   --  9.1*  --  7.9* 6.3*  --  2.9*  --   BNP  --   --  212.0* 300.6*  --  138.6*  --   --   --   DDIMER  --   --  0.44 0.50  --  0.40  --   --   --   PROCALCITON <0.10  --   --  <0.10  --  <0.10  --   --   --   AST 27  --   --  41  --  27 24  --  29  ALT 22  --   --  34  --  31 32  --  36  ALKPHOS 75  --   --  72  --  71 70  --  73  BILITOT 1.1  --   --  1.0  --  0.7 0.9  --  0.8  ALBUMIN 3.6  --   --  3.4*  --  3.2* 3.4*  --  3.5  INR 1.0  --   --   --   --   --   --   --   --   LATICACIDVEN 2.0* 2.7*  --   --   --   --   --   --   --          Condition - Extremely Guarded  Family Communication  :  son over the phone 09/11/20, message left for son Charles SorrowJerry [phone] on his cell phone on 09/12/2020 at 10 AM, called 09/13/20   Code Status :  Full  Consults  :   PCCM  PUD Prophylaxis : PPI   Procedures  :     CTA -  1. No evidence of pulmonary embolism. 2. 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 3. Marked severity emphysematous lung disease with mild to moderate severity left apical and bilateral lower lobe scarring and/or atelectasis  TTE -  1. Left ventricular ejection fraction, by estimation, is 55 to 60%. The left ventricle has normal function. Left ventricular endocardial border not optimally defined to evaluate regional wall motion. Left ventricular diastolic parameters are consistent with Grade I diastolic dysfunction (impaired relaxation).  2. Right ventricular systolic function is normal. The right ventricular size is normal. Tricuspid regurgitation signal is inadequate for assessing PA pressure.  3. The mitral valve was not  well visualized. No evidence of mitral valve regurgitation. No evidence of mitral stenosis.  4. The aortic valve was not well visualized. Aortic valve regurgitation is not visualized. No aortic stenosis is present.  5. The inferior vena cava is dilated in size with >50% respiratory variability, suggesting right atrial pressure of 8 mmHg.  6. Challenging image quality.      Disposition Plan  :    Status is: Inpatient  Remains inpatient appropriate because:IV treatments appropriate due to intensity of illness or inability to take PO  Dispo: The patient is from: Home              Anticipated d/c is to: Home              Patient currently is not medically stable to d/c.   Difficult to place patient No  DVT Prophylaxis  :    enoxaparin (LOVENOX) injection 40 mg Start: 09/10/20 2200 Place TED hose Start: 09/10/20 2129    Lab Results  Component Value Date   PLT 349 09/15/2020    Diet :  Diet Order             Diet Heart Room service appropriate? Yes; Fluid consistency: Thin  Diet effective now                    Inpatient  Medications  Scheduled Meds:  albuterol  2 puff Inhalation Q6H   amLODipine  10 mg Oral Daily   azithromycin  250 mg Oral Daily   enoxaparin (LOVENOX) injection  40 mg Subcutaneous Q24H   ipratropium  2 puff Inhalation Q6H   methylPREDNISolone (SOLU-MEDROL) injection  40 mg Intravenous Q12H   mometasone-formoterol  2 puff Inhalation BID   mupirocin ointment   Nasal BID   nystatin  5 mL Mouth/Throat QID   pantoprazole  80 mg Oral Daily   PARoxetine  10 mg Oral Daily   Continuous Infusions:   PRN Meds:.hydrALAZINE, LORazepam, [DISCONTINUED] ondansetron **OR** ondansetron (ZOFRAN) IV, traZODone  Antibiotics  :    Anti-infectives (From admission, onward)    Start     Dose/Rate Route Frequency Ordered Stop   09/15/20 1000  azithromycin (ZITHROMAX) tablet 250 mg        250 mg Oral Daily 09/14/20 1551 09/17/20 0959   09/13/20 1015  azithromycin (ZITHROMAX) tablet 250 mg        250 mg Oral Daily 09/13/20 0929 09/14/20 0952   09/11/20 1800  azithromycin (ZITHROMAX) 500 mg in sodium chloride 0.9 % 250 mL IVPB        500 mg 250 mL/hr over 60 Minutes Intravenous Every 24 hours 09/10/20 2131 09/12/20 1850   09/10/20 1730  cefTRIAXone (ROCEPHIN) 2 g in sodium chloride 0.9 % 100 mL IVPB  Status:  Discontinued        2 g 200 mL/hr over 30 Minutes Intravenous Every 24 hours 09/10/20 1728 09/10/20 2131   09/10/20 1730  azithromycin (ZITHROMAX) 500 mg in sodium chloride 0.9 % 250 mL IVPB  Status:  Discontinued        500 mg 250 mL/hr over 60 Minutes Intravenous Every 24 hours 09/10/20 1728 09/10/20 2131        Time Spent in minutes  30   Susa Raring M.D on 09/15/2020 at 12:08 PM  To page go to [email or web address]   [facility] -  Office  [phone]   See all Orders from today for further details    Objective:   Vitals:  09/14/20 1951 09/15/20 0417 09/15/20 0707 09/15/20 0734  BP: 131/86 130/87 (!) 129/91 127/88  Pulse: (!) 107 99 (!) 104 93  Resp: Temp:  98 F (36.7 C) 97.8 F (36.6 C)    TempSrc: Oral Oral    SpO2: 99% 98% 97% 96%  Weight:      Height:        Wt Readings from Last 3 Encounters:  09/11/20 60.1 kg  06/01/20 63.4 kg  10/22/18 60.3 kg     Intake/Output Summary (Last 24 hours) at 09/15/2020 1208 Last data filed at 09/15/2020 0659 Gross per 24 hour  Intake 0 ml  Output 200 ml  Net -200 ml     Physical Exam  Awake Alert, No new F.N deficits, anxious affect [place].AT,PERRAL Supple Neck,No JVD, No cervical lymphadenopathy appriciated.  Symmetrical Chest wall movement, Good air movement bilaterally, mild wheezing RRR,No Gallops, Rubs or new Murmurs, No Parasternal Heave +ve B.Sounds, Abd Soft, No tenderness, No organomegaly appriciated, No rebound - guarding or rigidity. No Cyanosis, Clubbing or edema, No new Rash or bruise    Data Review:    CBC Recent Labs  Lab 09/10/20 1728 09/11/20 0616 09/12/20 0432 09/13/20 0416 09/14/20 0512 09/15/20 0704  WBC 9.2 11.8* 14.8* 13.7* 15.9* 17.8*  HGB 13.1 12.4* 13.7 13.5 12.8* 14.0  HCT 39.3 36.4* 41.7 40.8 37.8* 41.8  PLT 289 256 306 328 312 349  MCV 92.7 89.7 91.9 91.5 92.2 93.7  MCH 30.9 30.5 30.2 30.3 31.2 31.4  MCHC 33.3 34.1 32.9 33.1 33.9 33.5  RDW 14.2 14.2 14.1 14.1 13.9 13.8  LYMPHSABS 0.5*  --  0.4* 0.3* 0.3* 0.4*  MONOABS 0.4  --  0.6 0.3 0.7 0.8  EOSABS 0.0  --  0.0 0.0 0.0 0.0  BASOSABS 0.0  --  0.0 0.0 0.0 0.0    Recent Labs  Lab 09/10/20 1728 09/10/20 2315 09/11/20 0616 09/12/20 0432 09/12/20 0642 09/13/20 0416 09/14/20 0512 09/14/20 0523 09/15/20 0704  NA 134*  --  136 139  --  138 137  --  137  K 4.2  --  4.4 4.5  --  4.3 4.1  --  4.6  CL 97*  --  98 96*  --  94* 96*  --  95*  CO2 25  --  28 33*  --  34* 31  --  30  GLUCOSE 118*  --  117* 102*  --  137* 138*  --  130*  BUN 16  --  16 33*  --  51* 51*  --  49*  CREATININE 1.07  --  0.95 1.04  --  1.12 1.05  --  0.99  CALCIUM 9.1  --  8.8* 9.1  --  9.1 9.3  --  9.5  AST 27  --    --  41  --  27 24  --  29  ALT 22  --   --  34  --  31 32  --  36  ALKPHOS 75  --   --  72  --  71 70  --  73  BILITOT 1.1  --   --  1.0  --  0.7 0.9  --  0.8  ALBUMIN 3.6  --   --  3.4*  --  3.2* 3.4*  --  3.5  MG  --   --  2.4 2.7*  --  2.8*  --   --   --   CRP  --   --  9.1*  --  7.9* 6.3*  --  2.9*  --   DDIMER  --   --  0.44 0.50  --  0.40  --   --   --   PROCALCITON <0.10  --   --  <0.10  --  <0.10  --   --   --   LATICACIDVEN 2.0* 2.7*  --   --   --   --   --   --   --   INR 1.0  --   --   --   --   --   --   --   --   BNP  --   --  212.0* 300.6*  --  138.6*  --   --   --     ------------------------------------------------------------------------------------------------------------------ No results for input(s): CHOL, HDL, LDLCALC, TRIG, CHOLHDL, LDLDIRECT in the last 72 hours.  No results found for: HGBA1C ------------------------------------------------------------------------------------------------------------------ No results for input(s): TSH, T4TOTAL, T3FREE, THYROIDAB in the last 72 hours.  Invalid input(s): FREET3  Cardiac Enzymes No results for input(s): CKMB, TROPONINI, MYOGLOBIN in the last 168 hours.  Invalid input(s): CK ------------------------------------------------------------------------------------------------------------------    Component Value Date/Time   BNP 138.6 (H) 09/13/2020 0416     Radiology Reports CT Angio Chest Pulmonary Embolism (PE) W or WO Contrast  Result Date: 09/12/2020 CLINICAL DATA:  Suspected pulmonary embolism. EXAM: CT ANGIOGRAPHY CHEST WITH CONTRAST TECHNIQUE: Multidetector CT imaging of the chest was performed using the standard protocol during bolus administration of intravenous contrast. Multiplanar CT image reconstructions and MIPs were obtained to evaluate the vascular anatomy. CONTRAST:  74mL OMNIPAQUE IOHEXOL 350 MG/ML SOLN COMPARISON:  None. FINDINGS: Cardiovascular: There is mild to moderate severity calcification of  the aortic arch. Satisfactory opacification of the pulmonary arteries to the segmental level. No evidence of pulmonary embolism. Normal heart size with moderate severity coronary artery calcification. No pericardial effusion. Mediastinum/Nodes: Small, partially calcified pretracheal and bilateral hilar nodes are seen. Thyroid gland, trachea, and esophagus demonstrate no significant findings. Lungs/Pleura: There is marked severity emphysematous lung disease. Postoperative changes are seen on the right, consistent with the patient's history of partial right lobectomy. Subsequent right-sided volume loss is seen with left to right shift of the superior mediastinal structures. A 3.4 cm x 2.5 cm x 4.1 cm area of fluid attenuation, with a thin partially calcified surrounding wall, is seen within the posterior aspect of the upper right lung (approximately 21 Hounsfield units). This is also likely postoperative in origin. Mild to moderate severity areas of scarring and atelectasis are seen along the left apex and posterior aspect of the bilateral lower lobes. Similar appearing changes are seen within the anterior aspect of the upper right lung. No pneumothorax is identified. Upper Abdomen: No acute abnormality. Musculoskeletal: Chronic and postoperative deformities are seen involving multiple right-sided ribs. Chronic fourth, fifth and sixth left rib fractures are seen. Review of the MIP images confirms the above findings. IMPRESSION: 1. No evidence of pulmonary embolism. 2. 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 3. Marked severity emphysematous lung disease with mild to moderate severity left apical and bilateral lower lobe scarring and/or atelectasis. Electronically Signed   By: Aram Candela M.D.   On: 09/12/2020 01:17   DG Chest Port 1 View  Result Date: 09/14/2020 CLINICAL DATA:  Shortness of breath; history COPD, bronchitis, GERD, hiatal hernia,  former smoker EXAM: PORTABLE CHEST 1 VIEW COMPARISON:  Portable exam 0752 hours compared to 09/12/2020 radiographic  and CT angio chest exams FINDINGS: Normal heart size and pulmonary vascularity. Volume loss in RIGHT hemithorax with mediastinal shift to the RIGHT. Atherosclerotic calcification aorta. Postsurgical changes and marked volume loss in RIGHT upper lobe with persistent pleuroparenchymal thickening at apex. Underlying emphysematous and chronic bronchitic changes. Bibasilar interstitial infiltrates versus chronic interstitial lung disease. Calcified granulomata LEFT upper lobe. Remaining lungs clear. Trace RIGHT pleural effusion. No pneumothorax. Bones demineralized with old fractures of the anterior LEFT fourth fifth and sixth ribs. IMPRESSION: Postoperative changes of the upper RIGHT hemithorax with volume loss in RIGHT chest. COPD changes with RIGHT upper lobe scarring. Bibasilar shall infiltrates versus chronic interstitial lung disease unchanged. Aortic Atherosclerosis (ICD10-I70.0) and Emphysema (ICD10-J43.9). Electronically Signed   By: Ulyses Southward M.D.   On: 09/14/2020 08:31   DG Chest Port 1 View  Result Date: 09/12/2020 CLINICAL DATA:  Respiratory distress. EXAM: PORTABLE CHEST 1 VIEW COMPARISON:  CTA chest from same day.  Chest x-ray from yesterday. FINDINGS: Normal heart size. Similar postsurgical changes, scarring, and volume loss in the right lung apex with rightward tracheal shift. Chronically coarsened interstitial markings again noted with lung hyperinflation and emphysematous changes. Similar reticulonodular densities at both lung bases. Similar scarring at the right costophrenic angle. No acute osseous abnormality. IMPRESSION: 1. Similar mild reticulonodular densities at both lung bases which may be infectious or inflammatory. 2. COPD with stable postsurgical changes, scarring, and volume loss in the right lung apex. Electronically Signed   By: Obie Dredge M.D.   On: 09/12/2020  07:41   DG Chest Port 1 View  Result Date: 09/11/2020 CLINICAL DATA:  Respiratory distress EXAM: PORTABLE CHEST 1 VIEW COMPARISON:  None. FINDINGS: Normal mediastinum and cardiac silhouette. There is volume loss in the RIGHT hemithorax related to prior surgery. There is mild airspace disease at the lung bases. No pneumothorax. No pleural fluid. IMPRESSION: Bibasilar mild airspace disease suggests pulmonary edema. Postsurgical volume loss in the RIGHT hemithorax Electronically Signed   By: Genevive Bi M.D.   On: 09/11/2020 07:33   DG Chest Port 1 View  Result Date: 09/10/2020 CLINICAL DATA:  Possible sepsis, history of COVID-19 positivity EXAM: PORTABLE CHEST 1 VIEW COMPARISON:  11/21/2017 FINDINGS: Cardiac shadow is within normal limits. The lungs are hyperinflated. Persistent scarring and volume loss is noted in the right upper lobe related to prior surgery and stable. Will rib fractures are noted on the right with healing. Mild atelectasis versus scarring is noted in the left base. No confluent focal infiltrate is seen. Aortic calcifications are noted. IMPRESSION: Chronic appearing changes. COPD without acute abnormality. Electronically Signed   By: Alcide Clever M.D.   On: 09/10/2020 17:55   ECHOCARDIOGRAM COMPLETE  Result Date: 09/12/2020    ECHOCARDIOGRAM REPORT   Patient Name:   LAMBROS CERRO Date of Exam: 09/11/2020 Medical Rec #:  [id number]     Height:       63.0 in Accession #:    [id number]    Weight:       132.5 lb Date of Birth:  [birth year]    BSA:          1.623 m Patient Age:    74 years      BP:           170/94 mmHg Patient Gender: M             HR:           96 bpm. Exam Location:  [facility] Procedure: 2D Echo and  Intracardiac Opacification Agent Indications:     CHF I50.31  History:         Patient has no prior history of Echocardiogram examinations.  Sonographer:     Overton Mam RDCS Referring Phys:  Effie Shy [place] [facility] Diagnosing Phys: Lorine Bears MD  Sonographer Comments:  Technically challenging study due to limited acoustic windows, suboptimal parasternal window, suboptimal apical window and suboptimal subcostal window. IMPRESSIONS  1. Left ventricular ejection fraction, by estimation, is 55 to 60%. The left ventricle has normal function. Left ventricular endocardial border not optimally defined to evaluate regional wall motion. Left ventricular diastolic parameters are consistent with Grade I diastolic dysfunction (impaired relaxation).  2. Right ventricular systolic function is normal. The right ventricular size is normal. Tricuspid regurgitation signal is inadequate for assessing PA pressure.  3. The mitral valve was not well visualized. No evidence of mitral valve regurgitation. No evidence of mitral stenosis.  4. The aortic valve was not well visualized. Aortic valve regurgitation is not visualized. No aortic stenosis is present.  5. The inferior vena cava is dilated in size with >50% respiratory variability, suggesting right atrial pressure of 8 mmHg.  6. Challenging image quality. FINDINGS  Left Ventricle: Left ventricular ejection fraction, by estimation, is 55 to 60%. The left ventricle has normal function. Left ventricular endocardial border not optimally defined to evaluate regional wall motion. Definity contrast agent was given IV to delineate the left ventricular endocardial borders. The left ventricular internal cavity size was normal in size. There is no left ventricular hypertrophy. Left ventricular diastolic parameters are consistent with Grade I diastolic dysfunction (impaired relaxation). Right Ventricle: The right ventricular size is normal. No increase in right ventricular wall thickness. Right ventricular systolic function is normal. Tricuspid regurgitation signal is inadequate for assessing PA pressure. Left Atrium: Left atrial size was normal in size. Right Atrium: Right atrial size was normal in size. Pericardium: There is no evidence of pericardial  effusion. Mitral Valve: The mitral valve was not well visualized. No evidence of mitral valve regurgitation. No evidence of mitral valve stenosis. Tricuspid Valve: The tricuspid valve is not well visualized. Tricuspid valve regurgitation is not demonstrated. No evidence of tricuspid stenosis. Aortic Valve: The aortic valve was not well visualized. Aortic valve regurgitation is not visualized. No aortic stenosis is present. Aortic valve peak gradient measures 2.6 mmHg. Pulmonic Valve: The pulmonic valve was not well visualized. Pulmonic valve regurgitation is not visualized. No evidence of pulmonic stenosis. Aorta: The aortic root was not well visualized. Venous: The inferior vena cava is dilated in size with greater than 50% respiratory variability, suggesting right atrial pressure of 8 mmHg. IAS/Shunts: No atrial level shunt detected by color flow Doppler.  LEFT VENTRICLE PLAX 2D LVIDd:         3.86 cm Diastology LVIDs:         2.67 cm LV e' lateral:   7.72 cm/s LV PW:         1.04 cm LV E/e' lateral: 6.1 LV IVS:        0.88 cm  LEFT ATRIUM           Index      RIGHT ATRIUM          Index LA Vol (A4C): 12.9 ml 7.95 ml/m RA Area:     7.62 cm  RA Volume:   15.90 ml 9.80 ml/m  AORTIC VALVE AV Vmax:      80.10 cm/s AV Peak Grad: 2.6 mmHg LVOT Vmax:    73.80 cm/s LVOT Vmean:   49.700 cm/s LVOT VTI:     0.104 m  AORTA Ao Root diam: 3.20 cm MITRAL VALVE MV Area (PHT): 2.36 cm    SHUNTS MV Decel Time: 322 msec    Systemic VTI: 0.10 m MV E velocity: 47.10 cm/s MV A velocity: 84.00 cm/s MV E/A ratio:  0.56 Lorine Bears MD Electronically signed by Lorine Bears MD Signature Date/Time: 09/12/2020/7:49:28 AM    Final

## 2020-09-15 NOTE — TOC Initial Note (Signed)
Transition of Care [facility]) - Initial/Assessment Note    Patient Details  Name: Charles Webster MRN: [id number] Date of Birth: 05-01-1945  Transition of Care [facility]) CM/SW Contact:    Chapman Fitch, RN Phone Number: 09/15/2020, 2:03 PM  Clinical Narrative:                 Admitted for: COPD/COVID Admitted from: Home alone PCP: Feldpausch,  at baseline takes himself to appointments [facility]   Current home health/prior home health/DME: no home DME or services  PT and OT recommending SNF.  Patient requiring HFNC and has elevated heart rate.  Due to this he is currently limited with his mobility  Patient is not interested in SNF at this time and declines bed search Patient states that he would be agreeable to home health services and states he does not have a preference of home health agency.  Referral made to [facility] with [facility]  Patient gave me permission to update his family.  VM left for son, daughters phone went straight to VM   Expected Discharge Plan: Home w Home Health Services Barriers to Discharge: Continued Medical Work up   Patient Goals and CMS Choice     Choice offered to / list presented to : Patient  Expected Discharge Plan and Services Expected Discharge Plan: Home w Home Health Services     Post Acute Care Choice: Home Health Living arrangements for the past 2 months: Single Family Home                           HH Arranged: RN, PT, OT, Nurse's Aide HH Agency: [facility] Date HH Agency Contacted: 09/15/20   Representative spoke with at [facility] Agency: [name]  Prior Living Arrangements/Services Living arrangements for the past 2 months: Single Family Home Lives with:: Self   Do you feel safe going back to the place where you live?: Yes      Need for Family Participation in Patient Care: Yes (Comment) Care giver support system in place?: Yes (comment)   Criminal Activity/Legal  Involvement Pertinent to Current Situation/Hospitalization: No - Comment as needed  Activities of Daily Living Home Assistive Devices/Equipment: None ADL Screening (condition at time of admission) Patient's cognitive ability adequate to safely complete daily activities?: Yes Is the patient deaf or have difficulty hearing?: No Does the patient have difficulty seeing, even when wearing glasses/contacts?: No Does the patient have difficulty concentrating, remembering, or making decisions?: No Patient able to express need for assistance with ADLs?: Yes Does the patient have difficulty dressing or bathing?: Yes Independently performs ADLs?: Yes (appropriate for developmental age) Does the patient have difficulty walking or climbing stairs?: No Weakness of Legs: None Weakness of Arms/Hands: None  Permission Sought/Granted                  Emotional Assessment       Orientation: : Oriented to Self, Oriented to Place, Oriented to  Time, Oriented to Situation Alcohol / Substance Use: Not Applicable Psych Involvement: No (comment)  Admission diagnosis:  Shortness of breath [R06.02] Acute on chronic respiratory failure with hypoxia (HCC) [J96.21] COVID-19 virus infection [U07.1] Patient Active Problem List   Diagnosis Date Noted   Shortness of breath 09/10/2020   COVID-19 virus infection 09/10/2020   COPD exacerbation (HCC) 05/05/2016   Allergic rhinitis 07/14/2013   Duodenitis 12/04/2012   Hiatal hernia 12/04/2012   PCP:  Marina Goodell, MD [facility]   [facility] [address] [place] - [address] [place] [address] Phone: [phone] Fax: [phone]     Social Determinants of Health (SDOH) Interventions    Readmission Risk Interventions No flowsheet data found.

## 2020-09-15 NOTE — Progress Notes (Signed)
Mobility Specialist - Progress Note   09/15/20 1200  Mobility  Activity Refused mobility  Mobility performed by Mobility specialist    Pt politely declined mobility this date d/t fatigue. Will attempt session another date/time.    Filiberto Pinks Mobility Specialist 09/15/20, 12:07 PM

## 2020-09-15 NOTE — Evaluation (Signed)
Occupational Therapy Evaluation Patient Details Name: Charles Webster MRN: [id number] DOB: [birth year] Today's Date: 09/15/2020    History of Present Illness 75 y.o. male with medical history significant for COPD, chronic O2 supplementation, GERD, presents emergency department for chief concerns of shortness of breath.  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   Clinical Impression   Pt was seen for OT evaluation this date. Pt reports that prior to admission and recent Covid diagnosis, he was active and independent, completing all ADL tasks independently and ambulating without an assisted device. Since Covid, pt reports that they require increased assistance with ADL tasks including bathing, dressing and functional mobility. Pt reports becoming easily fatigued or out of breath with minimal exertion. Had recently been using O2 at home, was told to use 2L O2 but pt endorses this was "not enough." Pt currently on 6L O2 for session.   Currently pt demonstrates impairments in cardiopulmonary status, activity tolerance, strength, and balance as described below (See OT problem list) which functionally limit his ability to perform ADL/self-care tasks at baseline independence. Pt currently requires PRN minimal assist for exertional ADL tasks including bathing, dressing, and grooming tasks in a seated position. CGA for ADL transfers with HHA. Pt up to 120's with standing and while doing some "knee bends" in standing with UE support. SpO2 down to high 80's with exertion and back up to low 90's with rest/PLB. Pt and son present educated in energy conservation conservation strategies including pursed lip breathing, activity pacing, AE/DME, and falls prevention. Pt verbalized understanding but would benefit from additional skilled OT services to maximize recall and carryover of learned techniques and facilitate implementation of learned  techniques into daily routines. Upon discharge, recommend SNF for short term rehab services at this time to maximize pt safety and return to functional independence during meaningful occupations of daily life.     Follow Up Recommendations  SNF    Equipment Recommendations  3 in 1 bedside commode;Tub/shower seat    Recommendations for Other Services       Precautions / Restrictions Precautions Precautions: Fall Precaution Comments: Cov, monitor O2/HR Restrictions Weight Bearing Restrictions: No      Mobility Bed Mobility Overal bed mobility: Needs Assistance Bed Mobility: Supine to Sit;Sit to Supine     Supine to sit: Supervision;HOB elevated Sit to supine: Supervision        Transfers Overall transfer level: Needs assistance Equipment used: Rolling walker (2 wheeled) Transfers: Sit to/from Stand Sit to Stand: Min guard              Balance Overall balance assessment: Needs assistance Sitting-balance support: No upper extremity supported;Feet supported Sitting balance-Leahy Scale: Good     Standing balance support: Single extremity supported Standing balance-Leahy Scale: Fair Standing balance comment: UE support on bed rail in standing able to perform "knee bends" to "warm my legs up" (knees blocked for safety)                           ADL either performed or assessed with clinical judgement   ADL Overall ADL's : Needs assistance/impaired                                       General ADL Comments: PRN Min A for LB ADL and VC  for ECS to support HR/SpO2 with exertion. CGA for ADL transfers     Vision         Perception     Praxis      Pertinent Vitals/Pain Pain Assessment: No/denies pain     Hand Dominance     Extremity/Trunk Assessment Upper Extremity Assessment Upper Extremity Assessment: Generalized weakness   Lower Extremity Assessment Lower Extremity Assessment: Generalized weakness       Communication  Communication Communication: No difficulties   Cognition Arousal/Alertness: Awake/alert Behavior During Therapy: WFL for tasks assessed/performed Overall Cognitive Status: Within Functional Limits for tasks assessed                                     General Comments  Improved HR today, up to 120's with standing and "knee bends."  SpO2 down to high 80's with exertion, 6L O2 throughout, back up to low 90's with rest/PLB.    Exercises Other Exercises Other Exercises: Pt/son instructed in falls prevention, activity pacing to support safety during ADL/mobility   Shoulder Instructions      Home Living Family/patient expects to be discharged to:: Private residence Living Arrangements: Alone Available Help at Discharge: Family;Available PRN/intermittently (multiple children live locally and can help) Type of Home: House       Home Layout: One level     Bathroom Shower/Tub: Chief Strategy Officer: Standard     Home Equipment: None          Prior Functioning/Environment Level of Independence: Independent        Comments: Pt reports that he is normally independent, drives, runs errands, etc w/o issue        OT Problem List: Cardiopulmonary status limiting activity;Decreased strength;Decreased activity tolerance;Impaired balance (sitting and/or standing);Decreased knowledge of use of DME or AE      OT Treatment/Interventions: Self-care/ADL training;Therapeutic exercise;Therapeutic activities;DME and/or AE instruction;Patient/family education;Energy conservation;Balance training    OT Goals(Current goals can be found in the care plan section) Acute Rehab OT Goals Patient Stated Goal: get breathing better and go home OT Goal Formulation: With patient/family Time For Goal Achievement: 09/29/20 Potential to Achieve Goals: Good ADL Goals Pt Will Perform Lower Body Dressing: with modified independence;sit to/from stand Pt Will Transfer to Toilet:  with supervision;ambulating (LRAD PRN, elevated commode, PLB) Additional ADL Goal #1: Pt will perform morning ADL routine including bathing while utilizing learned ECS requiring supervision for safety and PRN VC to utilize ECS.  OT Frequency: Min 2X/week   Barriers to D/C:            Co-evaluation              AM-PAC OT "6 Clicks" Daily Activity     Outcome Measure Help from another person eating meals?: None Help from another person taking care of personal grooming?: A Little Help from another person toileting, which includes using toliet, bedpan, or urinal?: A Little Help from another person bathing (including washing, rinsing, drying)?: A Little Help from another person to put on and taking off regular upper body clothing?: A Little Help from another person to put on and taking off regular lower body clothing?: A Little 6 Click Score: 19   End of Session Equipment Utilized During Treatment: Oxygen  Activity Tolerance: Patient tolerated treatment well Patient left: in bed;with call bell/phone within reach;with bed alarm set;with family/visitor present  OT Visit Diagnosis: Other abnormalities of gait  and mobility (R26.89);Muscle weakness (generalized) (M62.81)                Time: 4580-9983 OT Time Calculation (min): 32 min Charges:  OT General Charges $OT Visit: 1 Visit OT Evaluation $OT Eval Moderate Complexity: 1 Mod OT Treatments $Therapeutic Activity: 8-22 mins  Wynona Canes, MPH, MS, OTR/L ascom [phone] 09/15/20, 1:53 PM

## 2020-09-15 NOTE — Progress Notes (Signed)
PT Cancellation Note  Patient Details Name: Charles Webster MRN: [id number] DOB: 12-Mar-1945   Cancelled Treatment:     Pt resting in bed upon arrival 99% on 6L O2 via [place], HR in steady at rest in mid 90's. Pt however declined any activity due to c/o being exhausted from sitting on EOB with OT earlier this am.  Continue to recommend SNF once medically stable.     Jannet Askew 09/15/2020, 11:26 AM

## 2020-09-16 DIAGNOSIS — J441 Chronic obstructive pulmonary disease with (acute) exacerbation: Secondary | ICD-10-CM | POA: Diagnosis not present

## 2020-09-16 DIAGNOSIS — Z515 Encounter for palliative care: Secondary | ICD-10-CM

## 2020-09-16 LAB — CBC WITH DIFFERENTIAL/PLATELET
Abs Immature Granulocytes: 0.08 10*3/uL — ABNORMAL HIGH (ref 0.00–0.07)
Basophils Absolute: 0 10*3/uL (ref 0.0–0.1)
Basophils Relative: 0 %
Eosinophils Absolute: 0 10*3/uL (ref 0.0–0.5)
Eosinophils Relative: 0 %
HCT: 39.3 % (ref 39.0–52.0)
Hemoglobin: 13.9 g/dL (ref 13.0–17.0)
Immature Granulocytes: 1 %
Lymphocytes Relative: 1 %
Lymphs Abs: 0.2 10*3/uL — ABNORMAL LOW (ref 0.7–4.0)
MCH: 31.9 pg (ref 26.0–34.0)
MCHC: 35.4 g/dL (ref 30.0–36.0)
MCV: 90.1 fL (ref 80.0–100.0)
Monocytes Absolute: 0.6 10*3/uL (ref 0.1–1.0)
Monocytes Relative: 4 %
Neutro Abs: 13.1 10*3/uL — ABNORMAL HIGH (ref 1.7–7.7)
Neutrophils Relative %: 94 %
Platelets: 357 10*3/uL (ref 150–400)
RBC: 4.36 MIL/uL (ref 4.22–5.81)
RDW: 13.5 % (ref 11.5–15.5)
WBC: 13.9 10*3/uL — ABNORMAL HIGH (ref 4.0–10.5)
nRBC: 0 % (ref 0.0–0.2)

## 2020-09-16 LAB — MAGNESIUM: Magnesium: 2.6 mg/dL — ABNORMAL HIGH (ref 1.7–2.4)

## 2020-09-16 LAB — COMPREHENSIVE METABOLIC PANEL
ALT: 34 U/L (ref 0–44)
AST: 19 U/L (ref 15–41)
Albumin: 3.3 g/dL — ABNORMAL LOW (ref 3.5–5.0)
Alkaline Phosphatase: 71 U/L (ref 38–126)
Anion gap: 8 (ref 5–15)
BUN: 52 mg/dL — ABNORMAL HIGH (ref 8–23)
CO2: 35 mmol/L — ABNORMAL HIGH (ref 22–32)
Calcium: 9.4 mg/dL (ref 8.9–10.3)
Chloride: 92 mmol/L — ABNORMAL LOW (ref 98–111)
Creatinine, Ser: 1.12 mg/dL (ref 0.61–1.24)
GFR, Estimated: >60 mL/min (ref >60)
Glucose, Bld: 131 mg/dL — ABNORMAL HIGH (ref 70–99)
Potassium: 4.4 mmol/L (ref 3.5–5.1)
Sodium: 135 mmol/L (ref 135–145)
Total Bilirubin: 0.9 mg/dL (ref 0.3–1.2)
Total Protein: 6.6 g/dL (ref 6.5–8.1)

## 2020-09-16 LAB — C-REACTIVE PROTEIN: CRP: 0.8 mg/dL (ref <1.0)

## 2020-09-16 MED ORDER — AZITHROMYCIN 250 MG PO TABS
250.0000 mg | ORAL_TABLET | Freq: Every day | ORAL | Status: AC
  Administered 2020-09-17: 250 mg via ORAL
  Filled 2020-09-16: qty 1

## 2020-09-16 MED ORDER — NEBIVOLOL HCL 5 MG PO TABS
5.0000 mg | ORAL_TABLET | Freq: Every day | ORAL | Status: DC
  Administered 2020-09-16 – 2020-09-23 (×8): 5 mg via ORAL
  Filled 2020-09-16 (×9): qty 1

## 2020-09-16 NOTE — Progress Notes (Signed)
Pt had runs of SVT 13 beats, HR-146. Pt asymptomatic. Oncall provider informed.

## 2020-09-16 NOTE — Progress Notes (Addendum)
PROGRESS NOTE                                                                                                                                                                                                             Patient Demographics:    Charles Webster, is a 75 y.o. male, DOB - 22-Jun-1945, IOX:[id number]  Outpatient Primary MD for the patient is Feldpausch, Madaline Guthrie, MD    LOS - 5  Admit date - 09/10/2020    Chief Complaint  Patient presents with   Respiratory Distress       Brief Narrative (HPI from H&P)    Charles Webster is a 75 y.o. male with medical history significant for COPD, chronic O2 supplementation, GERD, presents emergency department for chief concerns of shortness of breath.  ER he was diagnosed with acute hypoxic respiratory failure due to COPD exacerbation, he was placed on BiPAP and admitted to the hospital.   Subjective:   Patient in bed, less anxious today denies any headache chest or abdominal pain, breathing has slightly improved, no focal weakness.   Assessment  & Plan :     Acute Hypoxic Resp. Failure due to Acute on chronic COPD exacerbation along with acute on chronic CHF nonspecific, recent COVID-19 infection - he has most likely combination of COPD and CHF exacerbation, he with IV Lasix along with IV steroids and azithromycin for atypical coverage, initially on BiPAP for a few days now on 5 L nasal cannula oxygen, of note he uses oxygen at home as well at baseline, case discussed with patient's pulmonologist Dr. Belia Heman on 09/14/2020 - terminal and advanced COPD, very frail and deconditioned at baseline- per pulmonary appropriate for palliative care to be called for goals of care discussion, continue IV steroids and azithromycin for a few more days, have continued Lasix but now on daily as-needed basis.  Overall marginally improved on 09/16/2020 but long-term prognosis remains poor.  Encouraged the patient  to sit up in chair in the daytime use I-S and flutter valve for pulmonary toiletry.  Will advance activity and titrate down oxygen as possible.   2.  Recent COVID-19 infection in a patient who is fully vaccinated and was treated with Paxil await.  Monitor inflammatory markers for now steroids should suffice.  I think main issue right now is COPD and CHF exacerbation.  3.  Hypertension.  Placed on Norvasc.  Monitor.  4.  Acute on diastolic CHF with EF 50%.  Echo noted, continue Lasix on as needed basis, chest pain-free and does have orthopnea..  5.  Recent right upper extremity wound present on admission.  Wound care team consulted.  6.  GERD.  On PPI.  7.  Mild Depression.  Placed on Paxil.    8. Diarrhea present on admission.  Better, says he gets Diarrhea on ABX, monitor.  9.  Run of nonsustained 13 beat asymptomatic V. tach.  EF 55%, electrolytes stable, in the light of his COPD will try Bystolic and monitor.   Recent Labs  Lab 09/10/20 1728 09/10/20 1728 09/10/20 2315 09/11/20 1610 09/12/20 0432 09/12/20 9604 09/13/20 0416 09/14/20 0512 09/14/20 0523 09/15/20 0704 09/16/20 0539  WBC 9.2  --   --  11.8* 14.8*  --  13.7* 15.9*  --  17.8* 13.9*  HGB 13.1  --   --  12.4* 13.7  --  13.5 12.8*  --  14.0 13.9  HCT 39.3  --   --  36.4* 41.7  --  40.8 37.8*  --  41.8 39.3  PLT 289  --   --  256 306  --  328 312  --  349 357  CRP  --    < >  --  9.1*  --  7.9* 6.3*  --  2.9* 1.3* 0.8  BNP  --   --   --  212.0* 300.6*  --  138.6*  --   --   --   --   DDIMER  --   --   --  0.44 0.50  --  0.40  --   --   --   --   PROCALCITON <0.10  --   --   --  <0.10  --  <0.10  --   --   --   --   AST 27  --   --   --  41  --  27 24  --  29 19  ALT 22  --   --   --  34  --  31 32  --  36 34  ALKPHOS 75  --   --   --  72  --  71 70  --  73 71  BILITOT 1.1  --   --   --  1.0  --  0.7 0.9  --  0.8 0.9  ALBUMIN 3.6  --   --   --  3.4*  --  3.2* 3.4*  --  3.5 3.3*  INR 1.0  --   --   --   --   --    --   --   --   --   --   LATICACIDVEN 2.0*  --  2.7*  --   --   --   --   --   --   --   --    < > = values in this interval not displayed.         Condition - Extremely Guarded  Family Communication  :  son over the phone 09/11/20, message left for son Charles Webster [phone] on his cell phone on 09/12/2020 at 10 AM, called 09/13/20   Code Status :  Full  Consults  :  PCCM, palliative care  PUD Prophylaxis : PPI   Procedures  :     CTA -  1. No evidence of  pulmonary embolism. 2. 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 3. Marked severity emphysematous lung disease with mild to moderate severity left apical and bilateral lower lobe scarring and/or atelectasis  TTE -  1. Left ventricular ejection fraction, by estimation, is 55 to 60%. The left ventricle has normal function. Left ventricular endocardial border not optimally defined to evaluate regional wall motion. Left ventricular diastolic parameters are consistent with Grade I diastolic dysfunction (impaired relaxation).  2. Right ventricular systolic function is normal. The right ventricular size is normal. Tricuspid regurgitation signal is inadequate for assessing PA pressure.  3. The mitral valve was not well visualized. No evidence of mitral valve regurgitation. No evidence of mitral stenosis.  4. The aortic valve was not well visualized. Aortic valve regurgitation is not visualized. No aortic stenosis is present.  5. The inferior vena cava is dilated in size with >50% respiratory variability, suggesting right atrial pressure of 8 mmHg.  6. Challenging image quality.      Disposition Plan  :    Status is: Inpatient  Remains inpatient appropriate because:IV treatments appropriate due to intensity of illness or inability to take PO  Dispo: The patient is from: Home              Anticipated d/c is to: Home              Patient currently is not medically stable to d/c.    Difficult to place patient No  DVT Prophylaxis  :    enoxaparin (LOVENOX) injection 40 mg Start: 09/10/20 2200 Place TED hose Start: 09/10/20 2129    Lab Results  Component Value Date   PLT 357 09/16/2020    Diet :  Diet Order             Diet Heart Room service appropriate? Yes; Fluid consistency: Thin  Diet effective now                    Inpatient Medications  Scheduled Meds:  albuterol  2 puff Inhalation Q6H   amLODipine  10 mg Oral Daily   enoxaparin (LOVENOX) injection  40 mg Subcutaneous Q24H   ipratropium  2 puff Inhalation Q6H   methylPREDNISolone (SOLU-MEDROL) injection  40 mg Intravenous Q12H   mometasone-formoterol  2 puff Inhalation BID   mupirocin ointment   Nasal BID   nystatin  5 mL Mouth/Throat QID   pantoprazole  80 mg Oral Daily   PARoxetine  10 mg Oral Daily   Continuous Infusions:   PRN Meds:.hydrALAZINE, LORazepam, [DISCONTINUED] ondansetron **OR** ondansetron (ZOFRAN) IV, traZODone  Antibiotics  :    Anti-infectives (From admission, onward)    Start     Dose/Rate Route Frequency Ordered Stop   09/15/20 1000  azithromycin (ZITHROMAX) tablet 250 mg        250 mg Oral Daily 09/14/20 1551 09/16/20 1047   09/13/20 1015  azithromycin (ZITHROMAX) tablet 250 mg        250 mg Oral Daily 09/13/20 0929 09/14/20 0952   09/11/20 1800  azithromycin (ZITHROMAX) 500 mg in sodium chloride 0.9 % 250 mL IVPB        500 mg 250 mL/hr over 60 Minutes Intravenous Every 24 hours 09/10/20 2131 09/12/20 1850   09/10/20 1730  cefTRIAXone (ROCEPHIN) 2 g in sodium chloride 0.9 % 100 mL IVPB  Status:  Discontinued        2 g 200 mL/hr over 30 Minutes Intravenous Every 24  hours 09/10/20 1728 09/10/20 2131   09/10/20 1730  azithromycin (ZITHROMAX) 500 mg in sodium chloride 0.9 % 250 mL IVPB  Status:  Discontinued        500 mg 250 mL/hr over 60 Minutes Intravenous Every 24 hours 09/10/20 1728 09/10/20 2131        Time Spent in minutes  30   Susa Raring  M.D on 09/16/2020 at 1:18 PM  To page go to [email or web address]   [facility] -  Office  [phone]   See all Orders from today for further details    Objective:   Vitals:   09/15/20 0734 09/15/20 2043 09/16/20 0401 09/16/20 0837  BP: 127/88 113/79 (!) 133/94 137/81  Pulse: 93 91 95 97  Resp: 18 20 20 20   Temp:  97.9 F (36.6 C) 98 F (36.7 C) 97.7 F (36.5 C)  TempSrc:  Oral  Oral  SpO2: 96% 95% 94% 94%  Weight:      Height:        Wt Readings from Last 3 Encounters:  09/11/20 60.1 kg  06/01/20 63.4 kg  10/22/18 60.3 kg     Intake/Output Summary (Last 24 hours) at 09/16/2020 1318 Last data filed at 09/16/2020 0400 Gross per 24 hour  Intake 240 ml  Output 400 ml  Net -160 ml     Physical Exam  Awake Alert, No new F.N deficits, anxious affect [place].AT,PERRAL Supple Neck,No JVD, No cervical lymphadenopathy appriciated.  Symmetrical Chest wall movement, moderate air movement bilaterally, wheezing has improved today RRR,No Gallops, Rubs or new Murmurs, No Parasternal Heave +ve B.Sounds, Abd Soft, No tenderness, No organomegaly appriciated, No rebound - guarding or rigidity. No Cyanosis, Clubbing or edema, No new Rash or bruise     Data Review:    CBC Recent Labs  Lab 09/12/20 0432 09/13/20 0416 09/14/20 0512 09/15/20 0704 09/16/20 0539  WBC 14.8* 13.7* 15.9* 17.8* 13.9*  HGB 13.7 13.5 12.8* 14.0 13.9  HCT 41.7 40.8 37.8* 41.8 39.3  PLT 306 328 312 349 357  MCV 91.9 91.5 92.2 93.7 90.1  MCH 30.2 30.3 31.2 31.4 31.9  MCHC 32.9 33.1 33.9 33.5 35.4  RDW 14.1 14.1 13.9 13.8 13.5  LYMPHSABS 0.4* 0.3* 0.3* 0.4* 0.2*  MONOABS 0.6 0.3 0.7 0.8 0.6  EOSABS 0.0 0.0 0.0 0.0 0.0  BASOSABS 0.0 0.0 0.0 0.0 0.0    Recent Labs  Lab 09/10/20 1728 09/10/20 1728 09/10/20 2315 09/11/20 0616 09/12/20 0432 09/12/20 09/14/20 09/13/20 0416 09/14/20 0512 09/14/20 0523 09/15/20 0704 09/16/20 0539  NA 134*  --   --  136 139  --  138 137  --  137 135  K 4.2  --    --  4.4 4.5  --  4.3 4.1  --  4.6 4.4  CL 97*  --   --  98 96*  --  94* 96*  --  95* 92*  CO2 25  --   --  28 33*  --  34* 31  --  30 35*  GLUCOSE 118*  --   --  117* 102*  --  137* 138*  --  130* 131*  BUN 16  --   --  16 33*  --  51* 51*  --  49* 52*  CREATININE 1.07  --   --  0.95 1.04  --  1.12 1.05  --  0.99 1.12  CALCIUM 9.1  --   --  8.8* 9.1  --  9.1 9.3  --  9.5 9.4  AST 27  --   --   --  41  --  27 24  --  29 19  ALT 22  --   --   --  34  --  31 32  --  36 34  ALKPHOS 75  --   --   --  72  --  71 70  --  73 71  BILITOT 1.1  --   --   --  1.0  --  0.7 0.9  --  0.8 0.9  ALBUMIN 3.6  --   --   --  3.4*  --  3.2* 3.4*  --  3.5 3.3*  MG  --   --   --  2.4 2.7*  --  2.8*  --   --   --  2.6*  CRP  --    < >  --  9.1*  --  7.9* 6.3*  --  2.9* 1.3* 0.8  DDIMER  --   --   --  0.44 0.50  --  0.40  --   --   --   --   PROCALCITON <0.10  --   --   --  <0.10  --  <0.10  --   --   --   --   LATICACIDVEN 2.0*  --  2.7*  --   --   --   --   --   --   --   --   INR 1.0  --   --   --   --   --   --   --   --   --   --   BNP  --   --   --  212.0* 300.6*  --  138.6*  --   --   --   --    < > = values in this interval not displayed.    ------------------------------------------------------------------------------------------------------------------ No results for input(s): CHOL, HDL, LDLCALC, TRIG, CHOLHDL, LDLDIRECT in the last 72 hours.  No results found for: HGBA1C ------------------------------------------------------------------------------------------------------------------ No results for input(s): TSH, T4TOTAL, T3FREE, THYROIDAB in the last 72 hours.  Invalid input(s): FREET3  Cardiac Enzymes No results for input(s): CKMB, TROPONINI, MYOGLOBIN in the last 168 hours.  Invalid input(s): CK ------------------------------------------------------------------------------------------------------------------    Component Value Date/Time   BNP 138.6 (H) 09/13/2020 0416     Radiology  Reports CT Angio Chest Pulmonary Embolism (PE) W or WO Contrast  Result Date: 09/12/2020 CLINICAL DATA:  Suspected pulmonary embolism. EXAM: CT ANGIOGRAPHY CHEST WITH CONTRAST TECHNIQUE: Multidetector CT imaging of the chest was performed using the standard protocol during bolus administration of intravenous contrast. Multiplanar CT image reconstructions and MIPs were obtained to evaluate the vascular anatomy. CONTRAST:  75mL OMNIPAQUE IOHEXOL 350 MG/ML SOLN COMPARISON:  None. FINDINGS: Cardiovascular: There is mild to moderate severity calcification of the aortic arch. Satisfactory opacification of the pulmonary arteries to the segmental level. No evidence of pulmonary embolism. Normal heart size with moderate severity coronary artery calcification. No pericardial effusion. Mediastinum/Nodes: Small, partially calcified pretracheal and bilateral hilar nodes are seen. Thyroid gland, trachea, and esophagus demonstrate no significant findings. Lungs/Pleura: There is marked severity emphysematous lung disease. Postoperative changes are seen on the right, consistent with the patient's history of partial right lobectomy. Subsequent right-sided volume loss is seen with left to right shift of the superior mediastinal structures. A 3.4 cm x 2.5 cm x 4.1 cm area of fluid attenuation, with a thin partially calcified  surrounding wall, is seen within the posterior aspect of the upper right lung (approximately 21 Hounsfield units). This is also likely postoperative in origin. Mild to moderate severity areas of scarring and atelectasis are seen along the left apex and posterior aspect of the bilateral lower lobes. Similar appearing changes are seen within the anterior aspect of the upper right lung. No pneumothorax is identified. Upper Abdomen: No acute abnormality. Musculoskeletal: Chronic and postoperative deformities are seen involving multiple right-sided ribs. Chronic fourth, fifth and sixth left rib fractures are seen.  Review of the MIP images confirms the above findings. IMPRESSION: 1. No evidence of pulmonary embolism. 2. 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 3. Marked severity emphysematous lung disease with mild to moderate severity left apical and bilateral lower lobe scarring and/or atelectasis. Electronically Signed   By: Aram Candela M.D.   On: 09/12/2020 01:17   DG Chest Port 1 View  Result Date: 09/14/2020 CLINICAL DATA:  Shortness of breath; history COPD, bronchitis, GERD, hiatal hernia, former smoker EXAM: PORTABLE CHEST 1 VIEW COMPARISON:  Portable exam 0752 hours compared to 09/12/2020 radiographic and CT angio chest exams FINDINGS: Normal heart size and pulmonary vascularity. Volume loss in RIGHT hemithorax with mediastinal shift to the RIGHT. Atherosclerotic calcification aorta. Postsurgical changes and marked volume loss in RIGHT upper lobe with persistent pleuroparenchymal thickening at apex. Underlying emphysematous and chronic bronchitic changes. Bibasilar interstitial infiltrates versus chronic interstitial lung disease. Calcified granulomata LEFT upper lobe. Remaining lungs clear. Trace RIGHT pleural effusion. No pneumothorax. Bones demineralized with old fractures of the anterior LEFT fourth fifth and sixth ribs. IMPRESSION: Postoperative changes of the upper RIGHT hemithorax with volume loss in RIGHT chest. COPD changes with RIGHT upper lobe scarring. Bibasilar shall infiltrates versus chronic interstitial lung disease unchanged. Aortic Atherosclerosis (ICD10-I70.0) and Emphysema (ICD10-J43.9). Electronically Signed   By: Ulyses Southward M.D.   On: 09/14/2020 08:31   DG Chest Port 1 View  Result Date: 09/12/2020 CLINICAL DATA:  Respiratory distress. EXAM: PORTABLE CHEST 1 VIEW COMPARISON:  CTA chest from same day.  Chest x-ray from yesterday. FINDINGS: Normal heart size. Similar postsurgical changes, scarring, and volume loss in the  right lung apex with rightward tracheal shift. Chronically coarsened interstitial markings again noted with lung hyperinflation and emphysematous changes. Similar reticulonodular densities at both lung bases. Similar scarring at the right costophrenic angle. No acute osseous abnormality. IMPRESSION: 1. Similar mild reticulonodular densities at both lung bases which may be infectious or inflammatory. 2. COPD with stable postsurgical changes, scarring, and volume loss in the right lung apex. Electronically Signed   By: Obie Dredge M.D.   On: 09/12/2020 07:41   DG Chest Port 1 View  Result Date: 09/11/2020 CLINICAL DATA:  Respiratory distress EXAM: PORTABLE CHEST 1 VIEW COMPARISON:  None. FINDINGS: Normal mediastinum and cardiac silhouette. There is volume loss in the RIGHT hemithorax related to prior surgery. There is mild airspace disease at the lung bases. No pneumothorax. No pleural fluid. IMPRESSION: Bibasilar mild airspace disease suggests pulmonary edema. Postsurgical volume loss in the RIGHT hemithorax Electronically Signed   By: Genevive Bi M.D.   On: 09/11/2020 07:33   DG Chest Port 1 View  Result Date: 09/10/2020 CLINICAL DATA:  Possible sepsis, history of COVID-19 positivity EXAM: PORTABLE CHEST 1 VIEW COMPARISON:  11/21/2017 FINDINGS: Cardiac shadow is within normal limits. The lungs are hyperinflated. Persistent scarring and volume loss is noted in the right upper lobe related to prior surgery and  stable. Will rib fractures are noted on the right with healing. Mild atelectasis versus scarring is noted in the left base. No confluent focal infiltrate is seen. Aortic calcifications are noted. IMPRESSION: Chronic appearing changes. COPD without acute abnormality. Electronically Signed   By: Alcide Clever M.D.   On: 09/10/2020 17:55   ECHOCARDIOGRAM COMPLETE  Result Date: 09/12/2020    ECHOCARDIOGRAM REPORT   Patient Name:   Charles Webster Date of Exam: 09/11/2020 Medical Rec #:  [id number]      Height:       63.0 in Accession #:    [id number]    Weight:       132.5 lb Date of Birth:  12/06/1945    BSA:          1.623 m Patient Age:    74 years      BP:           170/94 mmHg Patient Gender: M             HR:           96 bpm. Exam Location:  [facility] Procedure: 2D Echo and Intracardiac Opacification Agent Indications:     CHF I50.31  History:         Patient has no prior history of Echocardiogram examinations.  Sonographer:     Overton Mam RDCS Referring Phys:  Effie Shy [facility] Diagnosing Phys: Lorine Bears MD  Sonographer Comments: Technically challenging study due to limited acoustic windows, suboptimal parasternal window, suboptimal apical window and suboptimal subcostal window. IMPRESSIONS  1. Left ventricular ejection fraction, by estimation, is 55 to 60%. The left ventricle has normal function. Left ventricular endocardial border not optimally defined to evaluate regional wall motion. Left ventricular diastolic parameters are consistent with Grade I diastolic dysfunction (impaired relaxation).  2. Right ventricular systolic function is normal. The right ventricular size is normal. Tricuspid regurgitation signal is inadequate for assessing PA pressure.  3. The mitral valve was not well visualized. No evidence of mitral valve regurgitation. No evidence of mitral stenosis.  4. The aortic valve was not well visualized. Aortic valve regurgitation is not visualized. No aortic stenosis is present.  5. The inferior vena cava is dilated in size with >50% respiratory variability, suggesting right atrial pressure of 8 mmHg.  6. Challenging image quality. FINDINGS  Left Ventricle: Left ventricular ejection fraction, by estimation, is 55 to 60%. The left ventricle has normal function. Left ventricular endocardial border not optimally defined to evaluate regional wall motion. Definity contrast agent was given IV to delineate the left ventricular endocardial borders. The left ventricular internal cavity  size was normal in size. There is no left ventricular hypertrophy. Left ventricular diastolic parameters are consistent with Grade I diastolic dysfunction (impaired relaxation). Right Ventricle: The right ventricular size is normal. No increase in right ventricular wall thickness. Right ventricular systolic function is normal. Tricuspid regurgitation signal is inadequate for assessing PA pressure. Left Atrium: Left atrial size was normal in size. Right Atrium: Right atrial size was normal in size. Pericardium: There is no evidence of pericardial effusion. Mitral Valve: The mitral valve was not well visualized. No evidence of mitral valve regurgitation. No evidence of mitral valve stenosis. Tricuspid Valve: The tricuspid valve is not well visualized. Tricuspid valve regurgitation is not demonstrated. No evidence of tricuspid stenosis. Aortic Valve: The aortic valve was not well visualized. Aortic valve regurgitation is not visualized. No aortic stenosis is present. Aortic valve peak gradient measures 2.6 mmHg. Pulmonic  Valve: The pulmonic valve was not well visualized. Pulmonic valve regurgitation is not visualized. No evidence of pulmonic stenosis. Aorta: The aortic root was not well visualized. Venous: The inferior vena cava is dilated in size with greater than 50% respiratory variability, suggesting right atrial pressure of 8 mmHg. IAS/Shunts: No atrial level shunt detected by color flow Doppler.  LEFT VENTRICLE PLAX 2D LVIDd:         3.86 cm Diastology LVIDs:         2.67 cm LV e' lateral:   7.72 cm/s LV PW:         1.04 cm LV E/e' lateral: 6.1 LV IVS:        0.88 cm  LEFT ATRIUM           Index      RIGHT ATRIUM          Index LA Vol (A4C): 12.9 ml 7.95 ml/m RA Area:     7.62 cm                                  RA Volume:   15.90 ml 9.80 ml/m  AORTIC VALVE AV Vmax:      80.10 cm/s AV Peak Grad: 2.6 mmHg LVOT Vmax:    73.80 cm/s LVOT Vmean:   49.700 cm/s LVOT VTI:     0.104 m  AORTA Ao Root diam: 3.20 cm  MITRAL VALVE MV Area (PHT): 2.36 cm    SHUNTS MV Decel Time: 322 msec    Systemic VTI: 0.10 m MV E velocity: 47.10 cm/s MV A velocity: 84.00 cm/s MV E/A ratio:  0.56 Lorine BearsMuhammad Arida MD Electronically signed by Lorine BearsMuhammad Arida MD Signature Date/Time: 09/12/2020/7:49:28 AM    Final

## 2020-09-16 NOTE — Plan of Care (Signed)
Palliative: Thank you for this consult. Unfortunately due to high volume of consults there will be a delay in a Palliative Provider seeing this patient. Palliative Medicine will return to service on 8/22 and will see patient at that time.  Please consider referral to out patient palliative services if discharge prior to inpatient team return.    No charge Lillia Carmel, NP Palliative Medicine Please call Palliative Medicine team phone with any questions [phone]. For individual providers please see AMION

## 2020-09-16 NOTE — Progress Notes (Signed)
PT Cancellation Note  Patient Details Name: SANKALP FERRELL MRN: [id number] DOB: 05-Aug-1945   Cancelled Treatment:     PT attempt. Pt was long sitting in bed with slightly labored breathing. Pt is visibly anxious and requested not performing therapy at this time. All vitals stable and pt encouraged for OOB activity, but remained unwilling. Acute PT will continue to follow and progress as able per current POC.    Rushie Chestnut 09/16/2020, 2:45 PM

## 2020-09-16 NOTE — Progress Notes (Signed)
Mobility Specialist - Progress Note   09/16/20 1546  Mobility  Activity Refused mobility  Mobility performed by Mobility specialist    2nd attempt this date. Pt declined mobility at this time, no reason specified. Requesting to try again tomorrow.    Filiberto Pinks Mobility Specialist 09/16/20, 3:47 PM

## 2020-09-16 NOTE — Progress Notes (Signed)
OT Cancellation Note  Patient Details Name: Charles Webster MRN: [id number] DOB: 08-31-1945   Cancelled Treatment:    Reason Eval/Treat Not Completed: Patient declined, no reason specified. Pt anxious, refusing therapy today. Will re-attempt at later date/time as able.   Wynona Canes, MPH, MS, OTR/L ascom [phone] 09/16/20, 2:55 PM

## 2020-09-17 ENCOUNTER — Inpatient Hospital Stay: Payer: Medicare Other

## 2020-09-17 DIAGNOSIS — J441 Chronic obstructive pulmonary disease with (acute) exacerbation: Secondary | ICD-10-CM | POA: Diagnosis not present

## 2020-09-17 LAB — CBC WITH DIFFERENTIAL/PLATELET
Abs Immature Granulocytes: 0.12 10*3/uL — ABNORMAL HIGH (ref 0.00–0.07)
Basophils Absolute: 0 10*3/uL (ref 0.0–0.1)
Basophils Relative: 0 %
Eosinophils Absolute: 0 10*3/uL (ref 0.0–0.5)
Eosinophils Relative: 0 %
HCT: 42.5 % (ref 39.0–52.0)
Hemoglobin: 14.4 g/dL (ref 13.0–17.0)
Immature Granulocytes: 1 %
Lymphocytes Relative: 1 %
Lymphs Abs: 0.2 10*3/uL — ABNORMAL LOW (ref 0.7–4.0)
MCH: 30.8 pg (ref 26.0–34.0)
MCHC: 33.9 g/dL (ref 30.0–36.0)
MCV: 90.8 fL (ref 80.0–100.0)
Monocytes Absolute: 0.7 10*3/uL (ref 0.1–1.0)
Monocytes Relative: 4 %
Neutro Abs: 15.8 10*3/uL — ABNORMAL HIGH (ref 1.7–7.7)
Neutrophils Relative %: 94 %
Platelets: 382 10*3/uL (ref 150–400)
RBC: 4.68 MIL/uL (ref 4.22–5.81)
RDW: 13.3 % (ref 11.5–15.5)
WBC: 16.9 10*3/uL — ABNORMAL HIGH (ref 4.0–10.5)
nRBC: 0 % (ref 0.0–0.2)

## 2020-09-17 LAB — COMPREHENSIVE METABOLIC PANEL
ALT: 37 U/L (ref 0–44)
AST: 21 U/L (ref 15–41)
Albumin: 3.7 g/dL (ref 3.5–5.0)
Alkaline Phosphatase: 80 U/L (ref 38–126)
Anion gap: 11 (ref 5–15)
BUN: 55 mg/dL — ABNORMAL HIGH (ref 8–23)
CO2: 31 mmol/L (ref 22–32)
Calcium: 9.6 mg/dL (ref 8.9–10.3)
Chloride: 92 mmol/L — ABNORMAL LOW (ref 98–111)
Creatinine, Ser: 1.13 mg/dL (ref 0.61–1.24)
GFR, Estimated: >60 mL/min (ref >60)
Glucose, Bld: 128 mg/dL — ABNORMAL HIGH (ref 70–99)
Potassium: 5 mmol/L (ref 3.5–5.1)
Sodium: 134 mmol/L — ABNORMAL LOW (ref 135–145)
Total Bilirubin: 1.2 mg/dL (ref 0.3–1.2)
Total Protein: 7.1 g/dL (ref 6.5–8.1)

## 2020-09-17 LAB — C-REACTIVE PROTEIN: CRP: 0.6 mg/dL (ref <1.0)

## 2020-09-17 MED ORDER — METHYLPREDNISOLONE SODIUM SUCC 40 MG IJ SOLR
40.0000 mg | Freq: Every day | INTRAMUSCULAR | Status: DC
  Administered 2020-09-18 – 2020-09-19 (×2): 40 mg via INTRAVENOUS
  Filled 2020-09-17 (×2): qty 1

## 2020-09-17 MED ORDER — FUROSEMIDE 10 MG/ML IJ SOLN
40.0000 mg | Freq: Once | INTRAMUSCULAR | Status: AC
  Administered 2020-09-17: 40 mg via INTRAVENOUS
  Filled 2020-09-17: qty 4

## 2020-09-17 NOTE — Plan of Care (Signed)
  Problem: Clinical Measurements: Goal: Ability to maintain clinical measurements within normal limits will improve Outcome: Progressing Goal: Will remain free from infection Outcome: Progressing Goal: Diagnostic test results will improve Outcome: Progressing Goal: Respiratory complications will improve Outcome: Progressing Goal: Cardiovascular complication will be avoided Outcome: Progressing   Pt is alert and oriented. V/S stable. On oxygen at 5lpm/[place] with sats at 96%, drops down to 80s% with activity. Dyspnea on exertion noted.

## 2020-09-17 NOTE — Progress Notes (Signed)
PROGRESS NOTE                                                                                                                                                                                                             Patient Demographics:    Charles Webster, is a 75 y.o. male, DOB - 28-Mar-1945, ONG:[id number]  Outpatient Primary MD for the patient is Feldpausch, [name], MD    LOS - 6  Admit date - 09/10/2020    Chief Complaint  Patient presents with   Respiratory Distress       Brief Narrative (HPI from H&P)    Charles Webster is a 75 y.o. male with medical history significant for COPD, chronic O2 supplementation, GERD, presents emergency department for chief concerns of shortness of breath.  ER he was diagnosed with acute hypoxic respiratory failure due to COPD exacerbation, he was placed on BiPAP and admitted to the hospital.   Subjective:   Patient in bed, appears comfortable, denies any headache, no fever, no chest pain or pressure,+ve shortness of breath , no abdominal pain. No new focal weakness.    Assessment  & Plan :     Acute Hypoxic Resp. Failure due to Acute on chronic COPD exacerbation along with acute on chronic CHF nonspecific, recent COVID-19 infection - he has most likely combination of COPD and CHF exacerbation, he with IV Lasix along with IV steroids and azithromycin for atypical coverage, initially on BiPAP for a few days now on 6 L nasal cannula oxygen, of note he uses oxygen at home as well at baseline, case discussed with patient's pulmonologist Dr. [name] on 09/14/2020 - terminal and advanced COPD, very frail and deconditioned at baseline- per pulmonary appropriate for palliative care to be called for goals of care discussion - consulted 09/15/20, he has now finished his azithromycin we will start tapering steroids gradually, IV Lasix repeat 09/17/20.  Overall marginally improved on 09/16/2020 but long-term  prognosis remains poor.  Encouraged the patient to sit up in chair in the daytime use I-S and flutter valve for pulmonary toiletry.  Will advance activity and titrate down oxygen as possible.   2.  Recent COVID-19 infection in a patient who is fully vaccinated and was treated with Paxil await.  Monitor inflammatory markers for now steroids should suffice.  I think main issue  right now is COPD and CHF exacerbation.  3.  Hypertension.  Placed on Norvasc.  Monitor.  4.  Acute on diastolic CHF with EF 50%.  Echo noted, continue Lasix on as needed basis, chest pain-free and does have orthopnea..  5.  Recent right upper extremity wound present on admission.  Wound care team consulted.  6.  GERD.  On PPI.  7.  Mild Depression.  Placed on Paxil.    8. Diarrhea present on admission.  Better, says he gets Diarrhea on ABX, monitor.  9.  Run of nonsustained 13 beat asymptomatic V. tach.  EF 55%, electrolytes stable, in the light of his COPD will try Bystolic and monitor.   Recent Labs  Lab 09/10/20 1728 09/10/20 2315 09/11/20 9562 09/12/20 0432 09/12/20 1308 09/13/20 0416 09/14/20 0512 09/14/20 0523 09/15/20 0704 09/16/20 0539 09/17/20 0407  WBC 9.2  --  11.8* 14.8*  --  13.7* 15.9*  --  17.8* 13.9* 16.9*  HGB 13.1  --  12.4* 13.7  --  13.5 12.8*  --  14.0 13.9 14.4  HCT 39.3  --  36.4* 41.7  --  40.8 37.8*  --  41.8 39.3 42.5  PLT 289  --  256 306  --  328 312  --  349 357 382  CRP  --   --  9.1*  --    < > 6.3*  --  2.9* 1.3* 0.8 0.6  BNP  --   --  212.0* 300.6*  --  138.6*  --   --   --   --   --   DDIMER  --   --  0.44 0.50  --  0.40  --   --   --   --   --   PROCALCITON <0.10  --   --  <0.10  --  <0.10  --   --   --   --   --   AST 27  --   --  41  --  27 24  --  ALT 22  --   --  34  --  31 32  --  36 34 37  ALKPHOS 75  --   --  72  --  71 70  --  73 71 80  BILITOT 1.1  --   --  1.0  --  0.7 0.9  --  0.8 0.9 1.2  ALBUMIN 3.6  --   --  3.4*  --  3.2* 3.4*  --  3.5  3.3* 3.7  INR 1.0  --   --   --   --   --   --   --   --   --   --   LATICACIDVEN 2.0* 2.7*  --   --   --   --   --   --   --   --   --    < > = values in this interval not displayed.         Condition - Extremely Guarded  Family Communication  :  son over the phone 09/11/20, message left for son Vue [phone] on his cell phone on 09/12/2020 at 10 AM, called 09/13/20 , daughter 09/16/20  Code Status :  Full  Consults  :  PCCM, palliative care  PUD Prophylaxis : PPI   Procedures  :     CTA -  1. No evidence of pulmonary embolism. 2. Extensive postoperative changes consistent with  the patient's history of prior partial right lobectomy. 3. Marked severity emphysematous lung disease with mild to moderate severity left apical and bilateral lower lobe scarring and/or atelectasis  TTE -  1. Left ventricular ejection fraction, by estimation, is 55 to 60%. The left ventricle has normal function. Left ventricular endocardial border not optimally defined to evaluate regional wall motion. Left ventricular diastolic parameters are consistent with Grade I diastolic dysfunction (impaired relaxation).  2. Right ventricular systolic function is normal. The right ventricular size is normal. Tricuspid regurgitation signal is inadequate for assessing PA pressure.  3. The mitral valve was not well visualized. No evidence of mitral valve regurgitation. No evidence of mitral stenosis.  4. The aortic valve was not well visualized. Aortic valve regurgitation is not visualized. No aortic stenosis is present.  5. The inferior vena cava is dilated in size with >50% respiratory variability, suggesting right atrial pressure of 8 mmHg.  6. Challenging image quality.      Disposition Plan  :    Status is: Inpatient  Remains inpatient appropriate because:IV treatments appropriate due to intensity of illness or inability to take PO  Dispo: The patient is from: Home              Anticipated d/c is to: Home               Patient currently is not medically stable to d/c.   Difficult to place patient No  DVT Prophylaxis  :    enoxaparin (LOVENOX) injection 40 mg Start: 09/10/20 2200 Place TED hose Start: 09/10/20 2129    Lab Results  Component Value Date   PLT 382 09/17/2020    Diet :  Diet Order             Diet Heart Room service appropriate? Yes; Fluid consistency: Thin  Diet effective now                    Inpatient Medications  Scheduled Meds:  albuterol  2 puff Inhalation Q6H   amLODipine  10 mg Oral Daily   enoxaparin (LOVENOX) injection  40 mg Subcutaneous Q24H   furosemide  40 mg Intravenous Once   ipratropium  2 puff Inhalation Q6H   methylPREDNISolone (SOLU-MEDROL) injection  40 mg Intravenous Q12H   mometasone-formoterol  2 puff Inhalation BID   mupirocin ointment   Nasal BID   nebivolol  5 mg Oral Daily   nystatin  5 mL Mouth/Throat QID   pantoprazole  80 mg Oral Daily   PARoxetine  10 mg Oral Daily   Continuous Infusions:   PRN Meds:.hydrALAZINE, LORazepam, [DISCONTINUED] ondansetron **OR** ondansetron (ZOFRAN) IV, traZODone  Antibiotics  :    Anti-infectives (From admission, onward)    Start     Dose/Rate Route Frequency Ordered Stop   09/17/20 1000  azithromycin (ZITHROMAX) tablet 250 mg        250 mg Oral Daily 09/16/20 1320 09/17/20 0952   09/15/20 1000  azithromycin (ZITHROMAX) tablet 250 mg        250 mg Oral Daily 09/14/20 1551 09/16/20 1047   09/13/20 1015  azithromycin (ZITHROMAX) tablet 250 mg        250 mg Oral Daily 09/13/20 0929 09/14/20 0952   09/11/20 1800  azithromycin (ZITHROMAX) 500 mg in sodium chloride 0.9 % 250 mL IVPB        500 mg 250 mL/hr over 60 Minutes Intravenous Every 24 hours 09/10/20 2131 09/12/20 1850   09/10/20 1730  cefTRIAXone (ROCEPHIN) 2 g in sodium chloride 0.9 % 100 mL IVPB  Status:  Discontinued        2 g 200 mL/hr over 30 Minutes Intravenous Every 24 hours 09/10/20 1728 09/10/20 2131   09/10/20 1730   azithromycin (ZITHROMAX) 500 mg in sodium chloride 0.9 % 250 mL IVPB  Status:  Discontinued        500 mg 250 mL/hr over 60 Minutes Intravenous Every 24 hours 09/10/20 1728 09/10/20 2131        Time Spent in minutes  30   Susa Raring M.D on 09/17/2020 at 11:50 AM  To page go to [email or web address]   [facility] -  Office  [phone]   See all Orders from today for further details    Objective:   Vitals:   09/16/20 0837 09/16/20 1928 09/17/20 0407 09/17/20 0756  BP: 137/81 120/86 (!) 143/82 128/89  Pulse: 97 80 80 76  Resp: 20 20 (!) 22 16  Temp: 97.7 F (36.5 C) 97.9 F (36.6 C) 98 F (36.7 C) (!) 97.5 F (36.4 C)  TempSrc: Oral  Oral Oral  SpO2: 94% 97% 96% 96%  Weight:      Height:        Wt Readings from Last 3 Encounters:  09/11/20 60.1 kg  06/01/20 63.4 kg  10/22/18 60.3 kg     Intake/Output Summary (Last 24 hours) at 09/17/2020 1150 Last data filed at 09/17/2020 0404 Gross per 24 hour  Intake 180 ml  Output 950 ml  Net -770 ml     Physical Exam  Awake Alert, No new F.N deficits, Normal affect Kramer.AT,PERRAL Supple Neck,No JVD, No cervical lymphadenopathy appriciated.  Symmetrical Chest wall movement, Good air movement bilaterally, Coarse B sounds, few rales RRR,No Gallops, Rubs or new Murmurs, No Parasternal Heave +ve B.Sounds, Abd Soft, No tenderness, No organomegaly appriciated, No rebound - guarding or rigidity. No Cyanosis, Clubbing or edema, No new Rash or bruise    Data Review:    CBC Recent Labs  Lab 09/13/20 0416 09/14/20 0512 09/15/20 0704 09/16/20 0539 09/17/20 0407  WBC 13.7* 15.9* 17.8* 13.9* 16.9*  HGB 13.5 12.8* 14.0 13.9 14.4  HCT 40.8 37.8* 41.8 39.3 42.5  PLT 328 312 349 357 382  MCV 91.5 92.2 93.7 90.1 90.8  MCH 30.3 31.2 31.4 31.9 30.8  MCHC 33.1 33.9 33.5 35.4 33.9  RDW 14.1 13.9 13.8 13.5 13.3  LYMPHSABS 0.3* 0.3* 0.4* 0.2* 0.2*  MONOABS 0.3 0.7 0.8 0.6 0.7  EOSABS 0.0 0.0 0.0 0.0 0.0  BASOSABS 0.0 0.0  0.0 0.0 0.0    Recent Labs  Lab 09/10/20 1728 09/10/20 2315 09/11/20 0616 09/12/20 0432 09/12/20 0642 09/13/20 0416 09/14/20 0512 09/14/20 0523 09/15/20 0704 09/16/20 0539 09/17/20 0407  NA 134*  --  136 139  --  138 137  --  137 135 134*  K 4.2  --  4.4 4.5  --  4.3 4.1  --  4.6 4.4 5.0  CL 97*  --  98 96*  --  94* 96*  --  95* 92* 92*  CO2 25  --  28 33*  --  34* 31  --  30 35* 31  GLUCOSE 118*  --  117* 102*  --  137* 138*  --  130* 131* 128*  BUN 16  --  16 33*  --  51* 51*  --  49* 52* 55*  CREATININE 1.07  --  0.95 1.04  --  1.12 1.05  --  0.99 1.12 1.13  CALCIUM 9.1  --  8.8* 9.1  --  9.1 9.3  --  9.5 9.4 9.6  AST 27  --   --  41  --  27 24  --  ALT 22  --   --  34  --  31 32  --  36 34 37  ALKPHOS 75  --   --  72  --  71 70  --  73 71 80  BILITOT 1.1  --   --  1.0  --  0.7 0.9  --  0.8 0.9 1.2  ALBUMIN 3.6  --   --  3.4*  --  3.2* 3.4*  --  3.5 3.3* 3.7  MG  --   --  2.4 2.7*  --  2.8*  --   --   --  2.6*  --   CRP  --   --  9.1*  --    < > 6.3*  --  2.9* 1.3* 0.8 0.6  DDIMER  --   --  0.44 0.50  --  0.40  --   --   --   --   --   PROCALCITON <0.10  --   --  <0.10  --  <0.10  --   --   --   --   --   LATICACIDVEN 2.0* 2.7*  --   --   --   --   --   --   --   --   --   INR 1.0  --   --   --   --   --   --   --   --   --   --   BNP  --   --  212.0* 300.6*  --  138.6*  --   --   --   --   --    < > = values in this interval not displayed.    ------------------------------------------------------------------------------------------------------------------ No results for input(s): CHOL, HDL, LDLCALC, TRIG, CHOLHDL, LDLDIRECT in the last 72 hours.  No results found for: HGBA1C ------------------------------------------------------------------------------------------------------------------ No results for input(s): TSH, T4TOTAL, T3FREE, THYROIDAB in the last 72 hours.  Invalid input(s): FREET3  Cardiac Enzymes No results for input(s): CKMB, TROPONINI,  MYOGLOBIN in the last 168 hours.  Invalid input(s): CK ------------------------------------------------------------------------------------------------------------------    Component Value Date/Time   BNP 138.6 (H) 09/13/2020 0416     Radiology Reports CT Angio Chest Pulmonary Embolism (PE) W or WO Contrast  Result Date: 09/12/2020 CLINICAL DATA:  Suspected pulmonary embolism. EXAM: CT ANGIOGRAPHY CHEST WITH CONTRAST TECHNIQUE: Multidetector CT imaging of the chest was performed using the standard protocol during bolus administration of intravenous contrast. Multiplanar CT image reconstructions and MIPs were obtained to evaluate the vascular anatomy. CONTRAST:  75mL OMNIPAQUE IOHEXOL 350 MG/ML SOLN COMPARISON:  None. FINDINGS: Cardiovascular: There is mild to moderate severity calcification of the aortic arch. Satisfactory opacification of the pulmonary arteries to the segmental level. No evidence of pulmonary embolism. Normal heart size with moderate severity coronary artery calcification. No pericardial effusion. Mediastinum/Nodes: Small, partially calcified pretracheal and bilateral hilar nodes are seen. Thyroid gland, trachea, and esophagus demonstrate no significant findings. Lungs/Pleura: There is marked severity emphysematous lung disease. Postoperative changes are seen on the right, consistent with the patient's history of partial right lobectomy. Subsequent right-sided volume loss is seen with left to right shift of the superior mediastinal structures. A 3.4 cm x 2.5 cm x 4.1  cm area of fluid attenuation, with a thin partially calcified surrounding wall, is seen within the posterior aspect of the upper right lung (approximately 21 Hounsfield units). This is also likely postoperative in origin. Mild to moderate severity areas of scarring and atelectasis are seen along the left apex and posterior aspect of the bilateral lower lobes. Similar appearing changes are seen within the anterior aspect of  the upper right lung. No pneumothorax is identified. Upper Abdomen: No acute abnormality. Musculoskeletal: Chronic and postoperative deformities are seen involving multiple right-sided ribs. Chronic fourth, fifth and sixth left rib fractures are seen. Review of the MIP images confirms the above findings. IMPRESSION: 1. No evidence of pulmonary embolism. 2. 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 3. Marked severity emphysematous lung disease with mild to moderate severity left apical and bilateral lower lobe scarring and/or atelectasis. Electronically Signed   By: Aram Candela M.D.   On: 09/12/2020 01:17   DG Chest Port 1 View  Result Date: 09/17/2020 CLINICAL DATA:  Shortness of breath and COVID infection for 1 week. COPD EXAM: PORTABLE CHEST 1 VIEW COMPARISON:  09/14/2020 FINDINGS: Tracheal deviation right. Normal heart size. Trace right pleural fluid or thickening, with similar blunting of the costophrenic angle. Extensive surgical changes in the superior right hemithorax, with resultant volume loss. Underlying advanced emphysema. No pneumothorax. Bibasilar pulmonary opacities are favored to represent areas of scarring and not significantly changed. No new pulmonary opacity. Remote left rib fractures. IMPRESSION: Relatively similar appearance of the chest since 09/14/2020. Surgical changes and volume loss in the right hemithorax with similar pleural fluid or thickening and bibasilar scarring. Advanced emphysema. Electronically Signed   By: Jeronimo Greaves M.D.   On: 09/17/2020 08:11   DG Chest Port 1 View  Result Date: 09/14/2020 CLINICAL DATA:  Shortness of breath; history COPD, bronchitis, GERD, hiatal hernia, former smoker EXAM: PORTABLE CHEST 1 VIEW COMPARISON:  Portable exam 0752 hours compared to 09/12/2020 radiographic and CT angio chest exams FINDINGS: Normal heart size and pulmonary vascularity. Volume loss in RIGHT hemithorax with  mediastinal shift to the RIGHT. Atherosclerotic calcification aorta. Postsurgical changes and marked volume loss in RIGHT upper lobe with persistent pleuroparenchymal thickening at apex. Underlying emphysematous and chronic bronchitic changes. Bibasilar interstitial infiltrates versus chronic interstitial lung disease. Calcified granulomata LEFT upper lobe. Remaining lungs clear. Trace RIGHT pleural effusion. No pneumothorax. Bones demineralized with old fractures of the anterior LEFT fourth fifth and sixth ribs. IMPRESSION: Postoperative changes of the upper RIGHT hemithorax with volume loss in RIGHT chest. COPD changes with RIGHT upper lobe scarring. Bibasilar shall infiltrates versus chronic interstitial lung disease unchanged. Aortic Atherosclerosis (ICD10-I70.0) and Emphysema (ICD10-J43.9). Electronically Signed   By: Ulyses Southward M.D.   On: 09/14/2020 08:31   DG Chest Port 1 View  Result Date: 09/12/2020 CLINICAL DATA:  Respiratory distress. EXAM: PORTABLE CHEST 1 VIEW COMPARISON:  CTA chest from same day.  Chest x-ray from yesterday. FINDINGS: Normal heart size. Similar postsurgical changes, scarring, and volume loss in the right lung apex with rightward tracheal shift. Chronically coarsened interstitial markings again noted with lung hyperinflation and emphysematous changes. Similar reticulonodular densities at both lung bases. Similar scarring at the right costophrenic angle. No acute osseous abnormality. IMPRESSION: 1. Similar mild reticulonodular densities at both lung bases which may be infectious or inflammatory. 2. COPD with stable postsurgical changes, scarring, and volume loss in the right lung apex. Electronically Signed   By: Vickki Hearing.D.  On: 09/12/2020 07:41   DG Chest Port 1 View  Result Date: 09/11/2020 CLINICAL DATA:  Respiratory distress EXAM: PORTABLE CHEST 1 VIEW COMPARISON:  None. FINDINGS: Normal mediastinum and cardiac silhouette. There is volume loss in the RIGHT  hemithorax related to prior surgery. There is mild airspace disease at the lung bases. No pneumothorax. No pleural fluid. IMPRESSION: Bibasilar mild airspace disease suggests pulmonary edema. Postsurgical volume loss in the RIGHT hemithorax Electronically Signed   By: Genevive Bi M.D.   On: 09/11/2020 07:33   DG Chest Port 1 View  Result Date: 09/10/2020 CLINICAL DATA:  Possible sepsis, history of COVID-19 positivity EXAM: PORTABLE CHEST 1 VIEW COMPARISON:  11/21/2017 FINDINGS: Cardiac shadow is within normal limits. The lungs are hyperinflated. Persistent scarring and volume loss is noted in the right upper lobe related to prior surgery and stable. Will rib fractures are noted on the right with healing. Mild atelectasis versus scarring is noted in the left base. No confluent focal infiltrate is seen. Aortic calcifications are noted. IMPRESSION: Chronic appearing changes. COPD without acute abnormality. Electronically Signed   By: Alcide Clever M.D.   On: 09/10/2020 17:55   ECHOCARDIOGRAM COMPLETE  Result Date: 09/12/2020    ECHOCARDIOGRAM REPORT   Patient Name:   HARNOOR KOHLES Date of Exam: 09/11/2020 Medical Rec #:  [id number]     Height:       63.0 in Accession #:    [id number]    Weight:       132.5 lb Date of Birth:  01-Sep-1945    BSA:          1.623 m Patient Age:    74 years      BP:           170/94 mmHg Patient Gender: M             HR:           96 bpm. Exam Location:  [facility] Procedure: 2D Echo and Intracardiac Opacification Agent Indications:     CHF I50.31  History:         Patient has no prior history of Echocardiogram examinations.  Sonographer:     Overton Mam RDCS Referring Phys:  Effie Shy [facility] Diagnosing Phys: Lorine Bears MD  Sonographer Comments: Technically challenging study due to limited acoustic windows, suboptimal parasternal window, suboptimal apical window and suboptimal subcostal window. IMPRESSIONS  1. Left ventricular ejection fraction, by estimation, is 55 to  60%. The left ventricle has normal function. Left ventricular endocardial border not optimally defined to evaluate regional wall motion. Left ventricular diastolic parameters are consistent with Grade I diastolic dysfunction (impaired relaxation).  2. Right ventricular systolic function is normal. The right ventricular size is normal. Tricuspid regurgitation signal is inadequate for assessing PA pressure.  3. The mitral valve was not well visualized. No evidence of mitral valve regurgitation. No evidence of mitral stenosis.  4. The aortic valve was not well visualized. Aortic valve regurgitation is not visualized. No aortic stenosis is present.  5. The inferior vena cava is dilated in size with >50% respiratory variability, suggesting right atrial pressure of 8 mmHg.  6. Challenging image quality. FINDINGS  Left Ventricle: Left ventricular ejection fraction, by estimation, is 55 to 60%. The left ventricle has normal function. Left ventricular endocardial border not optimally defined to evaluate regional wall motion. Definity contrast agent was given IV to delineate the left ventricular endocardial borders. The left ventricular internal cavity size was normal in size.  There is no left ventricular hypertrophy. Left ventricular diastolic parameters are consistent with Grade I diastolic dysfunction (impaired relaxation). Right Ventricle: The right ventricular size is normal. No increase in right ventricular wall thickness. Right ventricular systolic function is normal. Tricuspid regurgitation signal is inadequate for assessing PA pressure. Left Atrium: Left atrial size was normal in size. Right Atrium: Right atrial size was normal in size. Pericardium: There is no evidence of pericardial effusion. Mitral Valve: The mitral valve was not well visualized. No evidence of mitral valve regurgitation. No evidence of mitral valve stenosis. Tricuspid Valve: The tricuspid valve is not well visualized. Tricuspid valve  regurgitation is not demonstrated. No evidence of tricuspid stenosis. Aortic Valve: The aortic valve was not well visualized. Aortic valve regurgitation is not visualized. No aortic stenosis is present. Aortic valve peak gradient measures 2.6 mmHg. Pulmonic Valve: The pulmonic valve was not well visualized. Pulmonic valve regurgitation is not visualized. No evidence of pulmonic stenosis. Aorta: The aortic root was not well visualized. Venous: The inferior vena cava is dilated in size with greater than 50% respiratory variability, suggesting right atrial pressure of 8 mmHg. IAS/Shunts: No atrial level shunt detected by color flow Doppler.  LEFT VENTRICLE PLAX 2D LVIDd:         3.86 cm Diastology LVIDs:         2.67 cm LV e' lateral:   7.72 cm/s LV PW:         1.04 cm LV E/e' lateral: 6.1 LV IVS:        0.88 cm  LEFT ATRIUM           Index      RIGHT ATRIUM          Index LA Vol (A4C): 12.9 ml 7.95 ml/m RA Area:     7.62 cm                                  RA Volume:   15.90 ml 9.80 ml/m  AORTIC VALVE AV Vmax:      80.10 cm/s AV Peak Grad: 2.6 mmHg LVOT Vmax:    73.80 cm/s LVOT Vmean:   49.700 cm/s LVOT VTI:     0.104 m  AORTA Ao Root diam: 3.20 cm MITRAL VALVE MV Area (PHT): 2.36 cm    SHUNTS MV Decel Time: 322 msec    Systemic VTI: 0.10 m MV E velocity: 47.10 cm/s MV A velocity: 84.00 cm/s MV E/A ratio:  0.56 Lorine Bears MD Electronically signed by Lorine Bears MD Signature Date/Time: 09/12/2020/7:49:28 AM    Final

## 2020-09-17 NOTE — Progress Notes (Signed)
Mobility Specialist - Progress Note   09/17/20 1159  Mobility  Activity Ambulated in room  Level of Assistance Standby assist, set-up cues, supervision of patient - no hands on  Assistive Device Front wheel walker  Distance Ambulated (ft) 15 ft  Mobility Out of bed to chair with meals;Ambulated with assistance in room  Mobility Response Tolerated well  Mobility performed by Mobility specialist  $Mobility charge 1 Mobility    Pre-mobility: 85 HR, 92% SpO2 During mobility: 93 HR, 94% SpO2 Post-mobility: 85 HR, 92% SpO2   Pt sitting on BSC upon arrival, utilizing 5L. Pt agreeable to trial ambulation this date. Unsuccessful with BM, but does have malfunction with urinal suction---2oz discarded. Mobility resolved issue. O2 bumped up to 6L for activity. Pt stood to RW with independence. Ambulated around bed to recliner with supervision, does voice feeling a little wobbly. No LOB. O2 maintained > 93% during ambulation, but does desat to 85% once seated. PLB engaged to rebound sats to low 90s. Pt left in recliner with all needs in reach and alarm set. Returned to 5L prior to exit. RN notified of performance.   Filiberto Pinks Mobility Specialist 09/17/20, 12:06 PM

## 2020-09-17 NOTE — Progress Notes (Addendum)
PT Cancellation Note  Patient Details Name: Charles Webster MRN: [id number] DOB: [birth year]   Cancelled Treatment:    Pt was up with mobility specialist ~ I hour prior. He endorses fatigue. Will return at later date and time.    Rushie Chestnut 09/17/2020, 1:40 PM

## 2020-09-18 DIAGNOSIS — J441 Chronic obstructive pulmonary disease with (acute) exacerbation: Secondary | ICD-10-CM | POA: Diagnosis not present

## 2020-09-18 LAB — BASIC METABOLIC PANEL
Anion gap: 11 (ref 5–15)
BUN: 64 mg/dL — ABNORMAL HIGH (ref 8–23)
CO2: 33 mmol/L — ABNORMAL HIGH (ref 22–32)
Calcium: 9.7 mg/dL (ref 8.9–10.3)
Chloride: 92 mmol/L — ABNORMAL LOW (ref 98–111)
Creatinine, Ser: 1.11 mg/dL (ref 0.61–1.24)
GFR, Estimated: >60 mL/min (ref >60)
Glucose, Bld: 113 mg/dL — ABNORMAL HIGH (ref 70–99)
Potassium: 4.4 mmol/L (ref 3.5–5.1)
Sodium: 136 mmol/L (ref 135–145)

## 2020-09-18 MED ORDER — SODIUM ZIRCONIUM CYCLOSILICATE 10 G PO PACK
10.0000 g | PACK | Freq: Two times a day (BID) | ORAL | Status: AC
  Administered 2020-09-18 (×2): 10 g via ORAL
  Filled 2020-09-18 (×2): qty 1

## 2020-09-18 MED ORDER — FUROSEMIDE 10 MG/ML IJ SOLN
40.0000 mg | Freq: Once | INTRAMUSCULAR | Status: AC
  Administered 2020-09-18: 40 mg via INTRAVENOUS
  Filled 2020-09-18: qty 4

## 2020-09-18 NOTE — Progress Notes (Signed)
PT Cancellation Note  Patient Details Name: Charles Webster MRN: [id number] DOB: 1945-04-21   Cancelled Treatment:    Reason Eval/Treat Not Completed: Medical issues which prohibited therapy   Pt in bed. Stated he was up in chair and to bathroom earlier today but endorses general malaise.  Vitals taken: 5 L 94% P 74 120/83 He seems generally uncomfortable and not feeling well.  Deferred session and discussed with RN.  Danielle Dess 09/18/2020, 11:36 AM

## 2020-09-18 NOTE — Progress Notes (Signed)
Pt taken off covid precautions. Pt tested positive for covid 8/9. Per hospital policy quarantine period is 10 days. Pt has completed 10 day quarantine period.

## 2020-09-18 NOTE — Progress Notes (Signed)
PROGRESS NOTE                                                                                                                                                                                                             Patient Demographics:    Charles Webster, is a 75 y.o. male, DOB - [birth year], ZOX:[id number]  Outpatient Primary MD for the patient is Feldpausch, Madaline Guthrie, MD    LOS - 7  Admit date - 09/10/2020    Chief Complaint  Patient presents with   Respiratory Distress       Brief Narrative (HPI from H&P)    Charles Webster is a 75 y.o. male with medical history significant for COPD, chronic O2 supplementation, GERD, presents emergency department for chief concerns of shortness of breath.  ER he was diagnosed with acute hypoxic respiratory failure due to COPD exacerbation, he was placed on BiPAP and admitted to the hospital.   Subjective:   Patient in bed, appears comfortable, denies any headache, no fever, no chest pain or pressure, mildly improved shortness of breath , no abdominal pain. No new focal weakness.   Assessment  & Plan :     Acute Hypoxic Resp. Failure due to Acute on chronic COPD exacerbation along with acute on chronic CHF nonspecific, recent COVID-19 infection - he has most likely combination of COPD and CHF exacerbation, he with IV Lasix along with IV steroids and azithromycin for atypical coverage, initially on BiPAP for a few days now on 6 L nasal cannula oxygen, of note he uses oxygen at home as well at baseline, case discussed with patient's pulmonologist Dr. Belia Heman on 09/14/2020 - terminal and advanced COPD, very frail and deconditioned at baseline- per pulmonary appropriate for palliative care to be called for goals of care discussion - consulted 09/15/20, he has now finished his azithromycin we will start tapering steroids gradually, IV Lasix repeat 09/18/20.  Overall marginally improved, but long-term  prognosis remains poor.  Encouraged the patient to sit up in chair in the daytime use I-S and flutter valve for pulmonary toiletry.  Will advance activity and titrate down oxygen as possible.   2.  Recent COVID-19 infection in a patient who is fully vaccinated and was treated with Paxil await.  Monitor inflammatory markers for now steroids should suffice.  I think main issue right  now is COPD and CHF exacerbation.  3.  Hypertension.  Placed on Norvasc.  Monitor.  4.  Acute on diastolic CHF with EF 50%.  Echo noted, continue Lasix on as needed basis, now on Bystolic, chest pain-free .  5.  Recent right upper extremity wound present on admission.  Wound care team consulted.  6.  GERD.  On PPI.  7.  Mild Depression.  Placed on Paxil.    8. Diarrhea present on admission.  Resolved with supportive care.  9.  Run of nonsustained 13 beat asymptomatic V. tach.  EF 55%, electrolytes stable, in the light of his COPD have added Bystolic and monitor.   Recent Labs  Lab 09/12/20 0432 09/12/20 1610 09/13/20 0416 09/14/20 0512 09/14/20 0523 09/15/20 0704 09/16/20 0539 09/17/20 0407  WBC 14.8*  --  13.7* 15.9*  --  17.8* 13.9* 16.9*  HGB 13.7  --  13.5 12.8*  --  14.0 13.9 14.4  HCT 41.7  --  40.8 37.8*  --  41.8 39.3 42.5  PLT 306  --  328 312  --  349 357 382  CRP  --    < > 6.3*  --  2.9* 1.3* 0.8 0.6  BNP 300.6*  --  138.6*  --   --   --   --   --   DDIMER 0.50  --  0.40  --   --   --   --   --   PROCALCITON <0.10  --  <0.10  --   --   --   --   --   AST 41  --  27 24  --  ALT 34  --  31 32  --  36 34 37  ALKPHOS 72  --  71 70  --  73 71 80  BILITOT 1.0  --  0.7 0.9  --  0.8 0.9 1.2  ALBUMIN 3.4*  --  3.2* 3.4*  --  3.5 3.3* 3.7   < > = values in this interval not displayed.         Condition - Extremely Guarded  Family Communication  :  son over the phone 09/11/20, message left for son Savannah [phone] on his cell phone on 09/12/2020 at 10 AM, called 09/13/20 ,  daughter 09/16/20  Code Status :  Full  Consults  :  PCCM, palliative care  PUD Prophylaxis : PPI   Procedures  :     CTA -  1. No evidence of pulmonary embolism. 2. 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 3. Marked severity emphysematous lung disease with mild to moderate severity left apical and bilateral lower lobe scarring and/or atelectasis  TTE -  1. Left ventricular ejection fraction, by estimation, is 55 to 60%. The left ventricle has normal function. Left ventricular endocardial border not optimally defined to evaluate regional wall motion. Left ventricular diastolic parameters are consistent with Grade I diastolic dysfunction (impaired relaxation).  2. Right ventricular systolic function is normal. The right ventricular size is normal. Tricuspid regurgitation signal is inadequate for assessing PA pressure.  3. The mitral valve was not well visualized. No evidence of mitral valve regurgitation. No evidence of mitral stenosis.  4. The aortic valve was not well visualized. Aortic valve regurgitation is not visualized. No aortic stenosis is present.  5. The inferior vena cava is dilated in size with >50% respiratory variability, suggesting right atrial pressure of 8 mmHg.  6. Challenging image quality.      Disposition Plan  :    Status is: Inpatient  Remains inpatient appropriate because:IV treatments appropriate due to intensity of illness or inability to take PO  Dispo: The patient is from: Home              Anticipated d/c is to: Home              Patient currently is not medically stable to d/c.   Difficult to place patient No  DVT Prophylaxis  :    enoxaparin (LOVENOX) injection 40 mg Start: 09/10/20 2200 Place TED hose Start: 09/10/20 2129    Lab Results  Component Value Date   PLT 382 09/17/2020    Diet :  Diet Order             Diet Heart Room service appropriate? Yes; Fluid consistency: Thin  Diet  effective now                    Inpatient Medications  Scheduled Meds:  albuterol  2 puff Inhalation Q6H   amLODipine  10 mg Oral Daily   enoxaparin (LOVENOX) injection  40 mg Subcutaneous Q24H   furosemide  40 mg Intravenous Once   ipratropium  2 puff Inhalation Q6H   methylPREDNISolone (SOLU-MEDROL) injection  40 mg Intravenous Daily   mometasone-formoterol  2 puff Inhalation BID   mupirocin ointment   Nasal BID   nebivolol  5 mg Oral Daily   nystatin  5 mL Mouth/Throat QID   pantoprazole  80 mg Oral Daily   PARoxetine  10 mg Oral Daily   sodium zirconium cyclosilicate  10 g Oral BID   Continuous Infusions:   PRN Meds:.hydrALAZINE, LORazepam, [DISCONTINUED] ondansetron **OR** ondansetron (ZOFRAN) IV, traZODone  Antibiotics  :    Anti-infectives (From admission, onward)    Start     Dose/Rate Route Frequency Ordered Stop   09/17/20 1000  azithromycin (ZITHROMAX) tablet 250 mg        250 mg Oral Daily 09/16/20 1320 09/17/20 0952   09/15/20 1000  azithromycin (ZITHROMAX) tablet 250 mg        250 mg Oral Daily 09/14/20 1551 09/16/20 1047   09/13/20 1015  azithromycin (ZITHROMAX) tablet 250 mg        250 mg Oral Daily 09/13/20 0929 09/14/20 0952   09/11/20 1800  azithromycin (ZITHROMAX) 500 mg in sodium chloride 0.9 % 250 mL IVPB        500 mg 250 mL/hr over 60 Minutes Intravenous Every 24 hours 09/10/20 2131 09/12/20 1850   09/10/20 1730  cefTRIAXone (ROCEPHIN) 2 g in sodium chloride 0.9 % 100 mL IVPB  Status:  Discontinued        2 g 200 mL/hr over 30 Minutes Intravenous Every 24 hours 09/10/20 1728 09/10/20 2131   09/10/20 1730  azithromycin (ZITHROMAX) 500 mg in sodium chloride 0.9 % 250 mL IVPB  Status:  Discontinued        500 mg 250 mL/hr over 60 Minutes Intravenous Every 24 hours 09/10/20 1728 09/10/20 2131        Time Spent in minutes  30   Susa Raring M.D on 09/18/2020 at 12:04 PM  To page go to [email or web address]   [facility] -  Office   [phone]   See all Orders from today for further details    Objective:   Vitals:   09/17/20 1551 09/17/20 1942 09/18/20 0340 09/18/20 0839  BP:  124/75 112/72 124/90 126/83  Pulse: 77 77 85 76  Resp: 20 (!) 22 (!) 22 18  Temp: (!) 97.4 F (36.3 C) 97.8 F (36.6 C) (!) 97.4 F (36.3 C) 97.8 F (36.6 C)  TempSrc: Oral Oral Oral Oral  SpO2: 93% 94% 95% 96%  Weight:      Height:        Wt Readings from Last 3 Encounters:  09/11/20 60.1 kg  06/01/20 63.4 kg  10/22/18 60.3 kg     Intake/Output Summary (Last 24 hours) at 09/18/2020 1204 Last data filed at 09/17/2020 2015 Gross per 24 hour  Intake --  Output 700 ml  Net -700 ml     Physical Exam  Awake Alert, No new F.N deficits, Normal affect [place].AT,PERRAL Supple Neck,No JVD, No cervical lymphadenopathy appriciated.  Symmetrical Chest wall movement, Good air movement bilaterally, mild rales RRR,No Gallops, Rubs or new Murmurs, No Parasternal Heave +ve B.Sounds, Abd Soft, No tenderness, No organomegaly appriciated, No rebound - guarding or rigidity. No Cyanosis, Clubbing or edema, No new Rash or bruise     Data Review:    CBC Recent Labs  Lab 09/13/20 0416 09/14/20 0512 09/15/20 0704 09/16/20 0539 09/17/20 0407  WBC 13.7* 15.9* 17.8* 13.9* 16.9*  HGB 13.5 12.8* 14.0 13.9 14.4  HCT 40.8 37.8* 41.8 39.3 42.5  PLT 328 312 349 357 382  MCV 91.5 92.2 93.7 90.1 90.8  MCH 30.3 31.2 31.4 31.9 30.8  MCHC 33.1 33.9 33.5 35.4 33.9  RDW 14.1 13.9 13.8 13.5 13.3  LYMPHSABS 0.3* 0.3* 0.4* 0.2* 0.2*  MONOABS 0.3 0.7 0.8 0.6 0.7  EOSABS 0.0 0.0 0.0 0.0 0.0  BASOSABS 0.0 0.0 0.0 0.0 0.0    Recent Labs  Lab 09/12/20 0432 09/12/20 0642 09/13/20 0416 09/14/20 0512 09/14/20 0523 09/15/20 0704 09/16/20 0539 09/17/20 0407 09/18/20 0401  NA 139  --  138 137  --  137 135 134* 136  K 4.5  --  4.3 4.1  --  4.6 4.4 5.0 4.4  CL 96*  --  94* 96*  --  95* 92* 92* 92*  CO2 33*  --  34* 31  --  30 35* 31 33*   GLUCOSE 102*  --  137* 138*  --  130* 131* 128* 113*  BUN 33*  --  51* 51*  --  49* 52* 55* 64*  CREATININE 1.04  --  1.12 1.05  --  0.99 1.12 1.13 1.11  CALCIUM 9.1  --  9.1 9.3  --  9.5 9.4 9.6 9.7  AST 41  --  27 24  --  --   ALT 34  --  31 32  --  36 34 37  --   ALKPHOS 72  --  71 70  --  73 71 80  --   BILITOT 1.0  --  0.7 0.9  --  0.8 0.9 1.2  --   ALBUMIN 3.4*  --  3.2* 3.4*  --  3.5 3.3* 3.7  --   MG 2.7*  --  2.8*  --   --   --  2.6*  --   --   CRP  --    < > 6.3*  --  2.9* 1.3* 0.8 0.6  --   DDIMER 0.50  --  0.40  --   --   --   --   --   --   PROCALCITON <0.10  --  <0.10  --   --   --   --   --   --  BNP 300.6*  --  138.6*  --   --   --   --   --   --    < > = values in this interval not displayed.    ------------------------------------------------------------------------------------------------------------------ No results for input(s): CHOL, HDL, LDLCALC, TRIG, CHOLHDL, LDLDIRECT in the last 72 hours.  No results found for: HGBA1C ------------------------------------------------------------------------------------------------------------------ No results for input(s): TSH, T4TOTAL, T3FREE, THYROIDAB in the last 72 hours.  Invalid input(s): FREET3  Cardiac Enzymes No results for input(s): CKMB, TROPONINI, MYOGLOBIN in the last 168 hours.  Invalid input(s): CK ------------------------------------------------------------------------------------------------------------------    Component Value Date/Time   BNP 138.6 (H) 09/13/2020 0416     Radiology Reports CT Angio Chest Pulmonary Embolism (PE) W or WO Contrast  Result Date: 09/12/2020 CLINICAL DATA:  Suspected pulmonary embolism. EXAM: CT ANGIOGRAPHY CHEST WITH CONTRAST TECHNIQUE: Multidetector CT imaging of the chest was performed using the standard protocol during bolus administration of intravenous contrast. Multiplanar CT image reconstructions and MIPs were obtained to evaluate the vascular anatomy.  CONTRAST:  20mL OMNIPAQUE IOHEXOL 350 MG/ML SOLN COMPARISON:  None. FINDINGS: Cardiovascular: There is mild to moderate severity calcification of the aortic arch. Satisfactory opacification of the pulmonary arteries to the segmental level. No evidence of pulmonary embolism. Normal heart size with moderate severity coronary artery calcification. No pericardial effusion. Mediastinum/Nodes: Small, partially calcified pretracheal and bilateral hilar nodes are seen. Thyroid gland, trachea, and esophagus demonstrate no significant findings. Lungs/Pleura: There is marked severity emphysematous lung disease. Postoperative changes are seen on the right, consistent with the patient's history of partial right lobectomy. Subsequent right-sided volume loss is seen with left to right shift of the superior mediastinal structures. A 3.4 cm x 2.5 cm x 4.1 cm area of fluid attenuation, with a thin partially calcified surrounding wall, is seen within the posterior aspect of the upper right lung (approximately 21 Hounsfield units). This is also likely postoperative in origin. Mild to moderate severity areas of scarring and atelectasis are seen along the left apex and posterior aspect of the bilateral lower lobes. Similar appearing changes are seen within the anterior aspect of the upper right lung. No pneumothorax is identified. Upper Abdomen: No acute abnormality. Musculoskeletal: Chronic and postoperative deformities are seen involving multiple right-sided ribs. Chronic fourth, fifth and sixth left rib fractures are seen. Review of the MIP images confirms the above findings. IMPRESSION: 1. No evidence of pulmonary embolism. 2. 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 3. Marked severity emphysematous lung disease with mild to moderate severity left apical and bilateral lower lobe scarring and/or atelectasis. Electronically Signed   By: Aram Candela M.D.   On:  09/12/2020 01:17   DG Chest Port 1 View  Result Date: 09/17/2020 CLINICAL DATA:  Shortness of breath and COVID infection for 1 week. COPD EXAM: PORTABLE CHEST 1 VIEW COMPARISON:  09/14/2020 FINDINGS: Tracheal deviation right. Normal heart size. Trace right pleural fluid or thickening, with similar blunting of the costophrenic angle. Extensive surgical changes in the superior right hemithorax, with resultant volume loss. Underlying advanced emphysema. No pneumothorax. Bibasilar pulmonary opacities are favored to represent areas of scarring and not significantly changed. No new pulmonary opacity. Remote left rib fractures. IMPRESSION: Relatively similar appearance of the chest since 09/14/2020. Surgical changes and volume loss in the right hemithorax with similar pleural fluid or thickening and bibasilar scarring. Advanced emphysema. Electronically Signed   By: Jeronimo Greaves M.D.   On: 09/17/2020 08:11   DG Chest [facility]  1 View  Result Date: 09/14/2020 CLINICAL DATA:  Shortness of breath; history COPD, bronchitis, GERD, hiatal hernia, former smoker EXAM: PORTABLE CHEST 1 VIEW COMPARISON:  Portable exam 0752 hours compared to 09/12/2020 radiographic and CT angio chest exams FINDINGS: Normal heart size and pulmonary vascularity. Volume loss in RIGHT hemithorax with mediastinal shift to the RIGHT. Atherosclerotic calcification aorta. Postsurgical changes and marked volume loss in RIGHT upper lobe with persistent pleuroparenchymal thickening at apex. Underlying emphysematous and chronic bronchitic changes. Bibasilar interstitial infiltrates versus chronic interstitial lung disease. Calcified granulomata LEFT upper lobe. Remaining lungs clear. Trace RIGHT pleural effusion. No pneumothorax. Bones demineralized with old fractures of the anterior LEFT fourth fifth and sixth ribs. IMPRESSION: Postoperative changes of the upper RIGHT hemithorax with volume loss in RIGHT chest. COPD changes with RIGHT upper lobe scarring.  Bibasilar shall infiltrates versus chronic interstitial lung disease unchanged. Aortic Atherosclerosis (ICD10-I70.0) and Emphysema (ICD10-J43.9). Electronically Signed   By: Ulyses SouthwardMark  Boles M.D.   On: 09/14/2020 08:31   DG Chest Port 1 View  Result Date: 09/12/2020 CLINICAL DATA:  Respiratory distress. EXAM: PORTABLE CHEST 1 VIEW COMPARISON:  CTA chest from same day.  Chest x-ray from yesterday. FINDINGS: Normal heart size. Similar postsurgical changes, scarring, and volume loss in the right lung apex with rightward tracheal shift. Chronically coarsened interstitial markings again noted with lung hyperinflation and emphysematous changes. Similar reticulonodular densities at both lung bases. Similar scarring at the right costophrenic angle. No acute osseous abnormality. IMPRESSION: 1. Similar mild reticulonodular densities at both lung bases which may be infectious or inflammatory. 2. COPD with stable postsurgical changes, scarring, and volume loss in the right lung apex. Electronically Signed   By: Obie DredgeWilliam T Derry M.D.   On: 09/12/2020 07:41   DG Chest Port 1 View  Result Date: 09/11/2020 CLINICAL DATA:  Respiratory distress EXAM: PORTABLE CHEST 1 VIEW COMPARISON:  None. FINDINGS: Normal mediastinum and cardiac silhouette. There is volume loss in the RIGHT hemithorax related to prior surgery. There is mild airspace disease at the lung bases. No pneumothorax. No pleural fluid. IMPRESSION: Bibasilar mild airspace disease suggests pulmonary edema. Postsurgical volume loss in the RIGHT hemithorax Electronically Signed   By: Genevive BiStewart  Edmunds M.D.   On: 09/11/2020 07:33   DG Chest Port 1 View  Result Date: 09/10/2020 CLINICAL DATA:  Possible sepsis, history of COVID-19 positivity EXAM: PORTABLE CHEST 1 VIEW COMPARISON:  11/21/2017 FINDINGS: Cardiac shadow is within normal limits. The lungs are hyperinflated. Persistent scarring and volume loss is noted in the right upper lobe related to prior surgery and stable.  Will rib fractures are noted on the right with healing. Mild atelectasis versus scarring is noted in the left base. No confluent focal infiltrate is seen. Aortic calcifications are noted. IMPRESSION: Chronic appearing changes. COPD without acute abnormality. Electronically Signed   By: Alcide CleverMark  Lukens M.D.   On: 09/10/2020 17:55   ECHOCARDIOGRAM COMPLETE  Result Date: 09/12/2020    ECHOCARDIOGRAM REPORT   Patient Name:   Loma MessingJERRY W Lahman Date of Exam: 09/11/2020 Medical Rec #:  [id number]     Height:       63.0 in Accession #:    [id number]    Weight:       132.5 lb Date of Birth:  1945-02-04    BSA:          1.623 m Patient Age:    74 years      BP:           170/94 mmHg  Patient Gender: M             HR:           96 bpm. Exam Location:  [facility] Procedure: 2D Echo and Intracardiac Opacification Agent Indications:     CHF I50.31  History:         Patient has no prior history of Echocardiogram examinations.  Sonographer:     Overton Mam RDCS Referring Phys:  Effie Shy [place] [facility] Diagnosing Phys: Lorine Bears MD  Sonographer Comments: Technically challenging study due to limited acoustic windows, suboptimal parasternal window, suboptimal apical window and suboptimal subcostal window. IMPRESSIONS  1. Left ventricular ejection fraction, by estimation, is 55 to 60%. The left ventricle has normal function. Left ventricular endocardial border not optimally defined to evaluate regional wall motion. Left ventricular diastolic parameters are consistent with Grade I diastolic dysfunction (impaired relaxation).  2. Right ventricular systolic function is normal. The right ventricular size is normal. Tricuspid regurgitation signal is inadequate for assessing PA pressure.  3. The mitral valve was not well visualized. No evidence of mitral valve regurgitation. No evidence of mitral stenosis.  4. The aortic valve was not well visualized. Aortic valve regurgitation is not visualized. No aortic stenosis is present.  5. The inferior  vena cava is dilated in size with >50% respiratory variability, suggesting right atrial pressure of 8 mmHg.  6. Challenging image quality. FINDINGS  Left Ventricle: Left ventricular ejection fraction, by estimation, is 55 to 60%. The left ventricle has normal function. Left ventricular endocardial border not optimally defined to evaluate regional wall motion. Definity contrast agent was given IV to delineate the left ventricular endocardial borders. The left ventricular internal cavity size was normal in size. There is no left ventricular hypertrophy. Left ventricular diastolic parameters are consistent with Grade I diastolic dysfunction (impaired relaxation). Right Ventricle: The right ventricular size is normal. No increase in right ventricular wall thickness. Right ventricular systolic function is normal. Tricuspid regurgitation signal is inadequate for assessing PA pressure. Left Atrium: Left atrial size was normal in size. Right Atrium: Right atrial size was normal in size. Pericardium: There is no evidence of pericardial effusion. Mitral Valve: The mitral valve was not well visualized. No evidence of mitral valve regurgitation. No evidence of mitral valve stenosis. Tricuspid Valve: The tricuspid valve is not well visualized. Tricuspid valve regurgitation is not demonstrated. No evidence of tricuspid stenosis. Aortic Valve: The aortic valve was not well visualized. Aortic valve regurgitation is not visualized. No aortic stenosis is present. Aortic valve peak gradient measures 2.6 mmHg. Pulmonic Valve: The pulmonic valve was not well visualized. Pulmonic valve regurgitation is not visualized. No evidence of pulmonic stenosis. Aorta: The aortic root was not well visualized. Venous: The inferior vena cava is dilated in size with greater than 50% respiratory variability, suggesting right atrial pressure of 8 mmHg. IAS/Shunts: No atrial level shunt detected by color flow Doppler.  LEFT VENTRICLE PLAX 2D LVIDd:          3.86 cm Diastology LVIDs:         2.67 cm LV e' lateral:   7.72 cm/s LV PW:         1.04 cm LV E/e' lateral: 6.1 LV IVS:        0.88 cm  LEFT ATRIUM           Index      RIGHT ATRIUM          Index LA Vol (A4C): 12.9 ml 7.95 ml/m RA  Area:     7.62 cm                                  RA Volume:   15.90 ml 9.80 ml/m  AORTIC VALVE AV Vmax:      80.10 cm/s AV Peak Grad: 2.6 mmHg LVOT Vmax:    73.80 cm/s LVOT Vmean:   49.700 cm/s LVOT VTI:     0.104 m  AORTA Ao Root diam: 3.20 cm MITRAL VALVE MV Area (PHT): 2.36 cm    SHUNTS MV Decel Time: 322 msec    Systemic VTI: 0.10 m MV E velocity: 47.10 cm/s MV A velocity: 84.00 cm/s MV E/A ratio:  0.56 Lorine Bears MD Electronically signed by Lorine Bears MD Signature Date/Time: 09/12/2020/7:49:28 AM    Final

## 2020-09-19 ENCOUNTER — Inpatient Hospital Stay: Payer: Medicare Other

## 2020-09-19 DIAGNOSIS — R0600 Dyspnea, unspecified: Secondary | ICD-10-CM

## 2020-09-19 DIAGNOSIS — J9621 Acute and chronic respiratory failure with hypoxia: Secondary | ICD-10-CM | POA: Diagnosis not present

## 2020-09-19 DIAGNOSIS — J441 Chronic obstructive pulmonary disease with (acute) exacerbation: Secondary | ICD-10-CM | POA: Diagnosis not present

## 2020-09-19 DIAGNOSIS — Z66 Do not resuscitate: Secondary | ICD-10-CM | POA: Diagnosis not present

## 2020-09-19 DIAGNOSIS — Z515 Encounter for palliative care: Secondary | ICD-10-CM | POA: Diagnosis not present

## 2020-09-19 MED ORDER — METHYLPREDNISOLONE SODIUM SUCC 40 MG IJ SOLR
30.0000 mg | Freq: Every day | INTRAMUSCULAR | Status: DC
  Administered 2020-09-20: 30 mg via INTRAVENOUS
  Filled 2020-09-19: qty 1

## 2020-09-19 MED ORDER — ADULT MULTIVITAMIN W/MINERALS CH
1.0000 | ORAL_TABLET | Freq: Every day | ORAL | Status: DC
  Administered 2020-09-19 – 2020-09-23 (×5): 1 via ORAL
  Filled 2020-09-19 (×5): qty 1

## 2020-09-19 MED ORDER — ENSURE ENLIVE PO LIQD
237.0000 mL | Freq: Two times a day (BID) | ORAL | Status: DC
  Administered 2020-09-19 – 2020-09-23 (×9): 237 mL via ORAL

## 2020-09-19 MED ORDER — FLUTICASONE PROPIONATE 50 MCG/ACT NA SUSP
1.0000 | Freq: Every day | NASAL | Status: DC
  Administered 2020-09-19 – 2020-09-22 (×4): 1 via NASAL
  Filled 2020-09-19: qty 16

## 2020-09-19 NOTE — Consult Note (Signed)
Consultation Note Date: 09/19/2020   Patient Name: Charles Webster  DOB: [birth year]  MRN: [id number]  Age / Sex: 75 y.o., male  PCP: Marina Goodell, MD Referring Physician: Leroy Sea, MD  Reason for Consultation: Establishing goals of care and Psychosocial/spiritual support  HPI/Patient Profile: 75 y.o. male  admitted on 09/10/2020 with medical history significant for COPD, chronic O2 supplementation, GERD, presents emergency department for chief concerns of shortness of breath.    ER he was diagnosed with acute hypoxic respiratory failure due to COPD exacerbation, he was placed on BiPAP and admitted to the hospital.  Today is day 8 of this hospital stay.  Patient remains very frail and deconditioned, long-term prognosis is limited.  Patient will family face treatment option decisions, advanced directive decisions and anticipatory care needs.  Clinical Assessment and Goals of Care:  This NP [name] reviewed medical records, received report from team, assessed the patient and then meet at the patient's bedside  to discuss diagnosis, prognosis, GOC, EOL wishes disposition and options.    Concept of Palliative Care was introduced as specialized medical care for people and their families living with serious illness.  If focuses on providing relief from the symptoms and stress of a serious illness.  The goal is to improve quality of life for both the patient and the family.   Values and goals of care important to patient and family were attempted to be elicited.  Created space and opportunity for patient  and his daughter to explore thoughts and feelings regarding current medical situation.   A  discussion was had today regarding advanced directives.  Concepts specific to code status, artifical feeding and hydration, continued IV antibiotics and rehospitalization was had.  The difference between a  aggressive medical intervention path  and a palliative comfort care path for this patient at this time was had.    With patient's permission I spoke to his daughter Louann Sjogren and a discussion and education was had with her regarding above concepts.  Both patient and his daughter verbalized an understanding of the seriousness of his current medical situation.  Albin Felling offers assistance and support with whenever her fathers as they arise.    DNR/DNI documented today   Thought is that he will transition home with her;  education offered on hospice benefit in the home.     Natural trajectory and expectations at EOL were discussed.  Questions and concerns addressed.  Patient  encouraged to call with questions or concerns.     PMT will continue to support holistically.    Re-meet tomorrow with daughter at bedside at 12:00 for further clarification of goals of care.          HCPOA    SUMMARY OF RECOMMENDATIONS    Code Status/Advance Care Planning: DNR-documented today    Symptom Management:  Per attending   Palliative Prophylaxis:  Delirium Protocol, Frequent Pain Assessment, and Oral Care  Additional Recommendations (Limitations, Scope, Preferences): Full Scope Treatment  Psycho-social/Spiritual:  Desire for further Chaplaincy  support:no Additional Recommendations: Education on Hospice  Prognosis:  Unable to determine  Discharge Planning: To Be Determined      Primary Diagnoses: Present on Admission:  Shortness of breath  COPD exacerbation (HCC)  COVID-19 virus infection   I have reviewed the medical record, interviewed the patient and family, and examined the patient. The following aspects are pertinent.  Past Medical History:  Diagnosis Date   Bronchitis    COPD (chronic obstructive pulmonary disease) (HCC)    Diverticulitis    GERD (gastroesophageal reflux disease)    Hiatal hernia    Social History   Socioeconomic History   Marital status:  Divorced    Spouse name: Not on file   Number of children: Not on file   Years of education: Not on file   Highest education level: Not on file  Occupational History   Not on file  Tobacco Use   Smoking status: Former    Packs/day: 1.00    Years: 40.00    Pack years: 40.00    Types: Cigarettes    Quit date: 10/04/2003    Years since quitting: 16.9   Smokeless tobacco: Never  Vaping Use   Vaping Use: Never used  Substance and Sexual Activity   Alcohol use: No   Drug use: No   Sexual activity: Not on file  Other Topics Concern   Not on file  Social History Narrative   Not on file   Social Determinants of Health   Financial Resource Strain: Not on file  Food Insecurity: Not on file  Transportation Needs: Not on file  Physical Activity: Not on file  Stress: Not on file  Social Connections: Not on file   Family History  Problem Relation Age of Onset   Heart disease Mother    Kidney failure Father    Scheduled Meds:  albuterol  2 puff Inhalation Q6H   amLODipine  10 mg Oral Daily   enoxaparin (LOVENOX) injection  40 mg Subcutaneous Q24H   feeding supplement  237 mL Oral BID BM   fluticasone  1 spray Each Nare Daily   ipratropium  2 puff Inhalation Q6H   [START ON 09/20/2020] methylPREDNISolone (SOLU-MEDROL) injection  30 mg Intravenous Daily   mometasone-formoterol  2 puff Inhalation BID   multivitamin with minerals  1 tablet Oral Daily   mupirocin ointment   Nasal BID   nebivolol  5 mg Oral Daily   nystatin  5 mL Mouth/Throat QID   pantoprazole  80 mg Oral Daily   PARoxetine  10 mg Oral Daily   Continuous Infusions: PRN Meds:.hydrALAZINE, LORazepam, [DISCONTINUED] ondansetron **OR** ondansetron (ZOFRAN) IV, traZODone Medications Prior to Admission:  Prior to Admission medications   Medication Sig Start Date End Date Taking? Authorizing Provider  albuterol (VENTOLIN HFA) 108 (90 Base) MCG/ACT inhaler Inhale 2 puffs into the lungs every 6 (six) hours as needed for  wheezing or shortness of breath. 10/22/18  Yes Kasa, Wallis Bamberg, MD  budesonide (PULMICORT) 0.5 MG/2ML nebulizer solution INHALE 1 VIAL TWICE A DAY 09/01/20  Yes Kasa, Kurian, MD  ipratropium-albuterol (DUONEB) 0.5-2.5 (3) MG/3ML SOLN TAKE 3 MLS BY NEBULIZATION EVERY 4 (FOUR) HOURS AS NEEDED. DX:J44.9 06/06/20  Yes Kasa, Wallis Bamberg, MD  LAGEVRIO 200 MG CAPS Take 4 capsules by mouth 2 (two) times daily. 09/06/20  Yes [provider]  omeprazole (PRILOSEC) 40 MG capsule Take 40 mg by mouth daily.   Yes [provider]  RESTASIS 0.05 % ophthalmic emulsion  05/26/20  Yes  [provider]   Allergies  Allergen Reactions   Alpha Blocker Quinazolines    Clindamycin/Lincomycin Nausea Only   Doxycycline Other (See Comments)   Levaquin [Levofloxacin]    Penicillins    Singulair [Montelukast Sodium] Other (See Comments)    Flu like symptoms   Sulfa Antibiotics Other (See Comments)   Tamsulosin Other (See Comments)   Review of Systems  Respiratory:  Positive for shortness of breath.   Neurological:  Positive for weakness.   Physical Exam Cardiovascular:     Rate and Rhythm: Normal rate.  Pulmonary:     Effort: Tachypnea present.     Breath sounds: Decreased air movement present.  Skin:    General: Skin is warm and dry.  Neurological:     Mental Status: He is alert.    Vital Signs: BP (!) 116/92 (BP Location: Right Arm)   Pulse 83   Temp (!) 97.4 F (36.3 C) (Oral)   Resp 20   Ht 5\' 3"  (1.6 m)   Wt 60.1 kg   SpO2 95%   BMI 23.47 kg/m  Pain Scale: 0-10   Pain Score: 0-No pain   SpO2: SpO2: 95 % O2 Device:SpO2: 95 % O2 Flow Rate: .O2 Flow Rate (L/min): 5 L/min  IO: Intake/output summary:  Intake/Output Summary (Last 24 hours) at 09/19/2020 1334 Last data filed at 09/19/2020 1000 Gross per 24 hour  Intake 220 ml  Output 1225 ml  Net -1005 ml    LBM: Last BM Date: 09/18/20 Baseline Weight: Weight: 65 kg Most recent weight: Weight: 60.1 kg     Palliative  Assessment/Data: 40 % at best   Discussed with Dr 09/20/20   Time In: 1200 Time Out: 1315 Time Total: 75 minutes Greater than 50%  of this time was spent counseling and coordinating care related to the above assessment and plan.  Signed by: 1316, NP   Please contact Palliative Medicine Team phone at [phone] for questions and concerns.  For individual provider: See [phone]

## 2020-09-19 NOTE — Progress Notes (Signed)
Occupational Therapy Treatment Patient Details Name: Charles Webster MRN: [id number] DOB: 03-14-1945 Today's Date: 09/19/2020    History of present illness 75 y.o. male with medical history significant for COPD, chronic O2 supplementation, GERD, presents emergency department for chief concerns of shortness of breath.  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   OT comments  Charles Webster reports feeling "very depressed," after receiving news earlier today that his prognosis for returning to PLOF is guarded. His O2 sats remained in mid-upper 90s on 5L O2, although pt displayed labored breathing, reports feeling increased SOB with any movement whatsoever (e.g., rolling in bed). Discussed POC/DC; pt reports his daughter has requested that pt come live with her. Charles Webster expresses fear of "being a burden," but is open to considering this option. DC plan updated to reflect possibility of pt transferring to daughter's home rather than SNF. In this case, will need to update equipment recommendations based on daughter's home set-up. Patient states he will be meeting with his children tomorrow to discuss POC.   Follow Up Recommendations  Home health OT;Other (comment) (hospice/palliative care)    Equipment Recommendations       Recommendations for Other Services      Precautions / Restrictions Precautions Precaution Comments: Monitor O2/HR Restrictions Weight Bearing Restrictions: No       Mobility Bed Mobility Overal bed mobility: Needs Assistance Bed Mobility: Supine to Sit;Sit to Supine     Supine to sit: HOB elevated;Min guard Sit to supine: Min guard   General bed mobility comments: increased time, labored effort    Transfers                 General transfer comment: pt declined    Balance Overall balance assessment: Needs assistance Sitting-balance support: No upper extremity supported;Feet  supported Sitting balance-Leahy Scale: Good                                     ADL either performed or assessed with clinical judgement   ADL Overall ADL's : Needs assistance/impaired     Grooming: Wash/dry hands;Wash/dry face;Modified independent;Set up Grooming Details (indicate cue type and reason): increased SOB even with bed level grooming tasks                                     Vision Patient Visual Report: No change from baseline     Perception     Praxis      Cognition Arousal/Alertness: Awake/alert Behavior During Therapy: WFL for tasks assessed/performed;Anxious Overall Cognitive Status: Within Functional Limits for tasks assessed                                 General Comments: Pt reports feeling "very depressed"        Exercises Other Exercises Other Exercises: Breathing exercises, therapeutic listening   Shoulder Instructions       General Comments Labored breathing, pt reporting he feels he is "working hard" to breath, but SpO2 remained in mid-upper 90s on 5L O2    Pertinent Vitals/ Pain       Pain Assessment: No/denies pain  Home Living  Prior Functioning/Environment              Frequency  Min 2X/week        Progress Toward Goals  OT Goals(current goals can now be found in the care plan section)  Progress towards OT goals: Progressing toward goals  Acute Rehab OT Goals Patient Stated Goal: get breathing better and go home OT Goal Formulation: With patient/family Time For Goal Achievement: 09/29/20 Potential to Achieve Goals: Good  Plan Discharge plan needs to be updated;Frequency remains appropriate    Co-evaluation                 AM-PAC OT "6 Clicks" Daily Activity     Outcome Measure   Help from another person eating meals?: None Help from another person taking care of personal grooming?: A Little Help from  another person toileting, which includes using toliet, bedpan, or urinal?: A Little Help from another person bathing (including washing, rinsing, drying)?: A Little Help from another person to put on and taking off regular upper body clothing?: A Little Help from another person to put on and taking off regular lower body clothing?: A Little 6 Click Score: 19    End of Session Equipment Utilized During Treatment: Oxygen  OT Visit Diagnosis: Other abnormalities of gait and mobility (R26.89);Muscle weakness (generalized) (M62.81)   Activity Tolerance Patient tolerated treatment well   Patient Left in bed;with call bell/phone within reach;with bed alarm set   Nurse Communication          Time: 7425-9563 OT Time Calculation (min): 10 min  Charges: OT General Charges $OT Visit: 1 Visit OT Treatments $Self Care/Home Management : 8-22 mins  Charles Craver, PhD, MS, OTR/L 09/19/20, 4:28 PM

## 2020-09-19 NOTE — Plan of Care (Signed)
  Problem: Clinical Measurements: Goal: Ability to maintain clinical measurements within normal limits will improve Outcome: Progressing   

## 2020-09-19 NOTE — Progress Notes (Signed)
PROGRESS NOTE                                                                                                                                                                                                             Patient Demographics:    Charles Webster, is a 75 y.o. male, DOB - [birth year], ZOX:[id number]  Outpatient Primary MD for the patient is Feldpausch, [name], MD    LOS - 8  Admit date - 09/10/2020    Chief Complaint  Patient presents with   Respiratory Distress       Brief Narrative (HPI from H&P)    Charles Webster is a 75 y.o. male with medical history significant for COPD, chronic O2 supplementation, GERD, presents emergency department for chief concerns of shortness of breath.  ER he was diagnosed with acute hypoxic respiratory failure due to COPD exacerbation, he was placed on BiPAP and admitted to the hospital.   Subjective:   Patient in bed, appears comfortable, denies any headache, no fever, no chest pain or pressure, +ve shortness of breath , no abdominal pain. No new focal weakness.    Assessment  & Plan :     Acute Hypoxic Resp. Failure due to Acute on chronic COPD exacerbation along with acute on chronic CHF nonspecific, recent COVID-19 infection - he has most likely combination of COPD and CHF exacerbation, he with IV Lasix along with IV steroids and azithromycin for atypical coverage, initially on BiPAP for a few days now on 6-8 L nasal cannula oxygen, of note he uses 2-3 L oxygen at home as well at baseline, case discussed with patient's pulmonologist Dr. [name] on 09/14/2020 - terminal and advanced COPD, very frail and deconditioned at baseline- per pulmonary appropriate for palliative care to be called for goals of care discussion - consulted 09/15/20, he has now finished his azithromycin we will start tapering steroids gradually, IV Lasix being given on as-needed basis, overall marginally improved, but  long-term prognosis remains poor, as recommended by pulmonologist will involve palliative care for long-term goals of care.  Encouraged the patient to sit up in chair in the daytime use I-S and flutter valve for pulmonary toiletry.  Will advance activity and titrate down oxygen as possible.   2.  Recent COVID-19 infection in a patient who is fully vaccinated and was treated with  Paxil await.  Monitor inflammatory markers for now steroids should suffice.  I think main issue right now is COPD and CHF exacerbation.  3.  Hypertension.  Placed on Norvasc.  Monitor.  4.  Acute on diastolic CHF with EF 50%.  Echo noted, continue Lasix on as needed basis, now on Bystolic, chest pain-free .  5.  Recent right upper extremity wound present on admission.  Wound care team consulted.  6.  GERD.  On PPI.  7.  Mild Depression.  Placed on Paxil.    8. Diarrhea present on admission.  Resolved with supportive care.  9.  Run of nonsustained 13 beat asymptomatic V. tach.  EF 55%, electrolytes stable, in the light of his COPD have added Bystolic and monitor.   Recent Labs  Lab 09/13/20 0416 09/14/20 0512 09/14/20 0523 09/15/20 0704 09/16/20 0539 09/17/20 0407  WBC 13.7* 15.9*  --  17.8* 13.9* 16.9*  HGB 13.5 12.8*  --  14.0 13.9 14.4  HCT 40.8 37.8*  --  41.8 39.3 42.5  PLT 328 312  --  349 357 382  CRP 6.3*  --  2.9* 1.3* 0.8 0.6  BNP 138.6*  --   --   --   --   --   DDIMER 0.40  --   --   --   --   --   PROCALCITON <0.10  --   --   --   --   --   AST 27 24  --  ALT 31 32  --  36 34 37  ALKPHOS 71 70  --  73 71 80  BILITOT 0.7 0.9  --  0.8 0.9 1.2  ALBUMIN 3.2* 3.4*  --  3.5 3.3* 3.7         Condition - Extremely Guarded  Family Communication  :  son over the phone 09/11/20, message left for son Eris [phone] on his cell phone on 09/12/2020 at 10 AM, called 09/13/20 , daughter 09/16/20  Code Status :  Full  Consults  :  PCCM, palliative care  PUD Prophylaxis : PPI    Procedures  :     CTA -  1. No evidence of pulmonary embolism. 2. 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 3. Marked severity emphysematous lung disease with mild to moderate severity left apical and bilateral lower lobe scarring and/or atelectasis  TTE -  1. Left ventricular ejection fraction, by estimation, is 55 to 60%. The left ventricle has normal function. Left ventricular endocardial border not optimally defined to evaluate regional wall motion. Left ventricular diastolic parameters are consistent with Grade I diastolic dysfunction (impaired relaxation).  2. Right ventricular systolic function is normal. The right ventricular size is normal. Tricuspid regurgitation signal is inadequate for assessing PA pressure.  3. The mitral valve was not well visualized. No evidence of mitral valve regurgitation. No evidence of mitral stenosis.  4. The aortic valve was not well visualized. Aortic valve regurgitation is not visualized. No aortic stenosis is present.  5. The inferior vena cava is dilated in size with >50% respiratory variability, suggesting right atrial pressure of 8 mmHg.  6. Challenging image quality.      Disposition Plan  :    Status is: Inpatient  Remains inpatient appropriate because:IV treatments appropriate due to intensity of illness or inability to take PO  Dispo: The patient is from: Home  Anticipated d/c is to: Home              Patient currently is not medically stable to d/c.   Difficult to place patient No  DVT Prophylaxis  :    enoxaparin (LOVENOX) injection 40 mg Start: 09/10/20 2200 Place TED hose Start: 09/10/20 2129    Lab Results  Component Value Date   PLT 382 09/17/2020    Diet :  Diet Order             Diet Heart Room service appropriate? Yes; Fluid consistency: Thin  Diet effective now                    Inpatient Medications  Scheduled Meds:  albuterol  2 puff  Inhalation Q6H   amLODipine  10 mg Oral Daily   enoxaparin (LOVENOX) injection  40 mg Subcutaneous Q24H   fluticasone  1 spray Each Nare Daily   ipratropium  2 puff Inhalation Q6H   [START ON 09/20/2020] methylPREDNISolone (SOLU-MEDROL) injection  30 mg Intravenous Daily   mometasone-formoterol  2 puff Inhalation BID   mupirocin ointment   Nasal BID   nebivolol  5 mg Oral Daily   nystatin  5 mL Mouth/Throat QID   pantoprazole  80 mg Oral Daily   PARoxetine  10 mg Oral Daily   Continuous Infusions:   PRN Meds:.hydrALAZINE, LORazepam, [DISCONTINUED] ondansetron **OR** ondansetron (ZOFRAN) IV, traZODone  Antibiotics  :    Anti-infectives (From admission, onward)    Start     Dose/Rate Route Frequency Ordered Stop   09/17/20 1000  azithromycin (ZITHROMAX) tablet 250 mg        250 mg Oral Daily 09/16/20 1320 09/17/20 0952   09/15/20 1000  azithromycin (ZITHROMAX) tablet 250 mg        250 mg Oral Daily 09/14/20 1551 09/16/20 1047   09/13/20 1015  azithromycin (ZITHROMAX) tablet 250 mg        250 mg Oral Daily 09/13/20 0929 09/14/20 0952   09/11/20 1800  azithromycin (ZITHROMAX) 500 mg in sodium chloride 0.9 % 250 mL IVPB        500 mg 250 mL/hr over 60 Minutes Intravenous Every 24 hours 09/10/20 2131 09/12/20 1850   09/10/20 1730  cefTRIAXone (ROCEPHIN) 2 g in sodium chloride 0.9 % 100 mL IVPB  Status:  Discontinued        2 g 200 mL/hr over 30 Minutes Intravenous Every 24 hours 09/10/20 1728 09/10/20 2131   09/10/20 1730  azithromycin (ZITHROMAX) 500 mg in sodium chloride 0.9 % 250 mL IVPB  Status:  Discontinued        500 mg 250 mL/hr over 60 Minutes Intravenous Every 24 hours 09/10/20 1728 09/10/20 2131        Time Spent in minutes  30   Susa Raring M.D on 09/19/2020 at 10:24 AM  To page go to [email or web address]   [facility] -  Office  [phone]   See all Orders from today for further details    Objective:   Vitals:   09/18/20 1700 09/18/20 2040 09/18/20  2348 09/19/20 0524  BP: 107/73 137/87  (!) 116/92  Pulse: 89 100  83  Resp: 19 20  20   Temp: 97.8 F (36.6 C) (!) 97.5 F (36.4 C)  (!) 97.4 F (36.3 C)  TempSrc: Oral Axillary  Oral  SpO2: 97% 96% 92% 97%  Weight:      Height:        Wt  Readings from Last 3 Encounters:  09/11/20 60.1 kg  06/01/20 63.4 kg  10/22/18 60.3 kg     Intake/Output Summary (Last 24 hours) at 09/19/2020 1024 Last data filed at 09/19/2020 0527 Gross per 24 hour  Intake 100 ml  Output 1100 ml  Net -1000 ml     Physical Exam  Awake Alert, No new F.N deficits, Normal affect [place].AT,PERRAL Supple Neck,No JVD, No cervical lymphadenopathy appriciated.  Symmetrical Chest wall movement, moderate air movement bilaterally, bibasilar Rales and few wheezes RRR,No Gallops, Rubs or new Murmurs, No Parasternal Heave +ve B.Sounds, Abd Soft, No tenderness, No organomegaly appriciated, No rebound - guarding or rigidity. No Cyanosis, Clubbing or edema, No new Rash or bruise    Data Review:    CBC Recent Labs  Lab 09/13/20 0416 09/14/20 0512 09/15/20 0704 09/16/20 0539 09/17/20 0407  WBC 13.7* 15.9* 17.8* 13.9* 16.9*  HGB 13.5 12.8* 14.0 13.9 14.4  HCT 40.8 37.8* 41.8 39.3 42.5  PLT 328 312 349 357 382  MCV 91.5 92.2 93.7 90.1 90.8  MCH 30.3 31.2 31.4 31.9 30.8  MCHC 33.1 33.9 33.5 35.4 33.9  RDW 14.1 13.9 13.8 13.5 13.3  LYMPHSABS 0.3* 0.3* 0.4* 0.2* 0.2*  MONOABS 0.3 0.7 0.8 0.6 0.7  EOSABS 0.0 0.0 0.0 0.0 0.0  BASOSABS 0.0 0.0 0.0 0.0 0.0    Recent Labs  Lab 09/13/20 0416 09/14/20 0512 09/14/20 0523 09/15/20 0704 09/16/20 0539 09/17/20 0407 09/18/20 0401  NA 138 137  --  137 135 134* 136  K 4.3 4.1  --  4.6 4.4 5.0 4.4  CL 94* 96*  --  95* 92* 92* 92*  CO2 34* 31  --  30 35* 31 33*  GLUCOSE 137* 138*  --  130* 131* 128* 113*  BUN 51* 51*  --  49* 52* 55* 64*  CREATININE 1.12 1.05  --  0.99 1.12 1.13 1.11  CALCIUM 9.1 9.3  --  9.5 9.4 9.6 9.7  AST 27 24  --  29 19 21   --   ALT 31  32  --  36 34 37  --   ALKPHOS 71 70  --  73 71 80  --   BILITOT 0.7 0.9  --  0.8 0.9 1.2  --   ALBUMIN 3.2* 3.4*  --  3.5 3.3* 3.7  --   MG 2.8*  --   --   --  2.6*  --   --   CRP 6.3*  --  2.9* 1.3* 0.8 0.6  --   DDIMER 0.40  --   --   --   --   --   --   PROCALCITON <0.10  --   --   --   --   --   --   BNP 138.6*  --   --   --   --   --   --     ------------------------------------------------------------------------------------------------------------------ No results for input(s): CHOL, HDL, LDLCALC, TRIG, CHOLHDL, LDLDIRECT in the last 72 hours.  No results found for: HGBA1C ------------------------------------------------------------------------------------------------------------------ No results for input(s): TSH, T4TOTAL, T3FREE, THYROIDAB in the last 72 hours.  Invalid input(s): FREET3  Cardiac Enzymes No results for input(s): CKMB, TROPONINI, MYOGLOBIN in the last 168 hours.  Invalid input(s): CK ------------------------------------------------------------------------------------------------------------------    Component Value Date/Time   BNP 138.6 (H) 09/13/2020 0416     Radiology Reports CT Angio Chest Pulmonary Embolism (PE) W or WO Contrast  Result Date: 09/12/2020 CLINICAL DATA:  Suspected pulmonary  embolism. EXAM: CT ANGIOGRAPHY CHEST WITH CONTRAST TECHNIQUE: Multidetector CT imaging of the chest was performed using the standard protocol during bolus administration of intravenous contrast. Multiplanar CT image reconstructions and MIPs were obtained to evaluate the vascular anatomy. CONTRAST:  75mL OMNIPAQUE IOHEXOL 350 MG/ML SOLN COMPARISON:  None. FINDINGS: Cardiovascular: There is mild to moderate severity calcification of the aortic arch. Satisfactory opacification of the pulmonary arteries to the segmental level. No evidence of pulmonary embolism. Normal heart size with moderate severity coronary artery calcification. No pericardial effusion.  Mediastinum/Nodes: Small, partially calcified pretracheal and bilateral hilar nodes are seen. Thyroid gland, trachea, and esophagus demonstrate no significant findings. Lungs/Pleura: There is marked severity emphysematous lung disease. Postoperative changes are seen on the right, consistent with the patient's history of partial right lobectomy. Subsequent right-sided volume loss is seen with left to right shift of the superior mediastinal structures. A 3.4 cm x 2.5 cm x 4.1 cm area of fluid attenuation, with a thin partially calcified surrounding wall, is seen within the posterior aspect of the upper right lung (approximately 21 Hounsfield units). This is also likely postoperative in origin. Mild to moderate severity areas of scarring and atelectasis are seen along the left apex and posterior aspect of the bilateral lower lobes. Similar appearing changes are seen within the anterior aspect of the upper right lung. No pneumothorax is identified. Upper Abdomen: No acute abnormality. Musculoskeletal: Chronic and postoperative deformities are seen involving multiple right-sided ribs. Chronic fourth, fifth and sixth left rib fractures are seen. Review of the MIP images confirms the above findings. IMPRESSION: 1. No evidence of pulmonary embolism. 2. 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 3. Marked severity emphysematous lung disease with mild to moderate severity left apical and bilateral lower lobe scarring and/or atelectasis. Electronically Signed   By: Aram Candela M.D.   On: 09/12/2020 01:17   DG Chest Port 1 View  Result Date: 09/19/2020 CLINICAL DATA:  75 year old male with a history of shortness of breath EXAM: PORTABLE CHEST 1 VIEW COMPARISON:  09/17/2020 FINDINGS: Cardiomediastinal silhouette unchanged in size and contour. No evidence of central vascular congestion. No interlobular septal thickening. Architectural distortion again  demonstrated with reticular pattern of opacities at the bilateral lung bases, postsurgical changes in the right hilar/suprahilar region, and pleuroparenchymal thickening at the right apex. Emphysematous changes on the anterior view. No pneumothorax. Coarsened interstitial markings, with no new confluent airspace disease. No acute displaced fracture. Partially healed left-sided rib fractures IMPRESSION: Similar appearance of the chest x-ray, with chronic changes/scarring, postoperative changes, and emphysema. Electronically Signed   By: Gilmer Mor D.O.   On: 09/19/2020 08:47   DG Chest Port 1 View  Result Date: 09/17/2020 CLINICAL DATA:  Shortness of breath and COVID infection for 1 week. COPD EXAM: PORTABLE CHEST 1 VIEW COMPARISON:  09/14/2020 FINDINGS: Tracheal deviation right. Normal heart size. Trace right pleural fluid or thickening, with similar blunting of the costophrenic angle. Extensive surgical changes in the superior right hemithorax, with resultant volume loss. Underlying advanced emphysema. No pneumothorax. Bibasilar pulmonary opacities are favored to represent areas of scarring and not significantly changed. No new pulmonary opacity. Remote left rib fractures. IMPRESSION: Relatively similar appearance of the chest since 09/14/2020. Surgical changes and volume loss in the right hemithorax with similar pleural fluid or thickening and bibasilar scarring. Advanced emphysema. Electronically Signed   By: Jeronimo Greaves M.D.   On: 09/17/2020 08:11   DG Chest Port 1 View  Result Date:  09/14/2020 CLINICAL DATA:  Shortness of breath; history COPD, bronchitis, GERD, hiatal hernia, former smoker EXAM: PORTABLE CHEST 1 VIEW COMPARISON:  Portable exam 0752 hours compared to 09/12/2020 radiographic and CT angio chest exams FINDINGS: Normal heart size and pulmonary vascularity. Volume loss in RIGHT hemithorax with mediastinal shift to the RIGHT. Atherosclerotic calcification aorta. Postsurgical changes and  marked volume loss in RIGHT upper lobe with persistent pleuroparenchymal thickening at apex. Underlying emphysematous and chronic bronchitic changes. Bibasilar interstitial infiltrates versus chronic interstitial lung disease. Calcified granulomata LEFT upper lobe. Remaining lungs clear. Trace RIGHT pleural effusion. No pneumothorax. Bones demineralized with old fractures of the anterior LEFT fourth fifth and sixth ribs. IMPRESSION: Postoperative changes of the upper RIGHT hemithorax with volume loss in RIGHT chest. COPD changes with RIGHT upper lobe scarring. Bibasilar shall infiltrates versus chronic interstitial lung disease unchanged. Aortic Atherosclerosis (ICD10-I70.0) and Emphysema (ICD10-J43.9). Electronically Signed   By: Ulyses Southward M.D.   On: 09/14/2020 08:31   DG Chest Port 1 View  Result Date: 09/12/2020 CLINICAL DATA:  Respiratory distress. EXAM: PORTABLE CHEST 1 VIEW COMPARISON:  CTA chest from same day.  Chest x-ray from yesterday. FINDINGS: Normal heart size. Similar postsurgical changes, scarring, and volume loss in the right lung apex with rightward tracheal shift. Chronically coarsened interstitial markings again noted with lung hyperinflation and emphysematous changes. Similar reticulonodular densities at both lung bases. Similar scarring at the right costophrenic angle. No acute osseous abnormality. IMPRESSION: 1. Similar mild reticulonodular densities at both lung bases which may be infectious or inflammatory. 2. COPD with stable postsurgical changes, scarring, and volume loss in the right lung apex. Electronically Signed   By: Obie Dredge M.D.   On: 09/12/2020 07:41   DG Chest Port 1 View  Result Date: 09/11/2020 CLINICAL DATA:  Respiratory distress EXAM: PORTABLE CHEST 1 VIEW COMPARISON:  None. FINDINGS: Normal mediastinum and cardiac silhouette. There is volume loss in the RIGHT hemithorax related to prior surgery. There is mild airspace disease at the lung bases. No  pneumothorax. No pleural fluid. IMPRESSION: Bibasilar mild airspace disease suggests pulmonary edema. Postsurgical volume loss in the RIGHT hemithorax Electronically Signed   By: Genevive Bi M.D.   On: 09/11/2020 07:33   DG Chest Port 1 View  Result Date: 09/10/2020 CLINICAL DATA:  Possible sepsis, history of COVID-19 positivity EXAM: PORTABLE CHEST 1 VIEW COMPARISON:  11/21/2017 FINDINGS: Cardiac shadow is within normal limits. The lungs are hyperinflated. Persistent scarring and volume loss is noted in the right upper lobe related to prior surgery and stable. Will rib fractures are noted on the right with healing. Mild atelectasis versus scarring is noted in the left base. No confluent focal infiltrate is seen. Aortic calcifications are noted. IMPRESSION: Chronic appearing changes. COPD without acute abnormality. Electronically Signed   By: Alcide Clever M.D.   On: 09/10/2020 17:55   ECHOCARDIOGRAM COMPLETE  Result Date: 09/12/2020    ECHOCARDIOGRAM REPORT   Patient Name:   JACKIE LITTLEJOHN Date of Exam: 09/11/2020 Medical Rec #:  [id number]     Height:       63.0 in Accession #:    [id number]    Weight:       132.5 lb Date of Birth:  [birth year]    BSA:          1.623 m Patient Age:    74 years      BP:           170/94 mmHg Patient Gender: M  HR:           96 bpm. Exam Location:  [facility] Procedure: 2D Echo and Intracardiac Opacification Agent Indications:     CHF I50.31  History:         Patient has no prior history of Echocardiogram examinations.  Sonographer:     Overton Mam RDCS Referring Phys:  Effie Shy [facility] Diagnosing Phys: Lorine Bears MD  Sonographer Comments: Technically challenging study due to limited acoustic windows, suboptimal parasternal window, suboptimal apical window and suboptimal subcostal window. IMPRESSIONS  1. Left ventricular ejection fraction, by estimation, is 55 to 60%. The left ventricle has normal function. Left ventricular endocardial border not  optimally defined to evaluate regional wall motion. Left ventricular diastolic parameters are consistent with Grade I diastolic dysfunction (impaired relaxation).  2. Right ventricular systolic function is normal. The right ventricular size is normal. Tricuspid regurgitation signal is inadequate for assessing PA pressure.  3. The mitral valve was not well visualized. No evidence of mitral valve regurgitation. No evidence of mitral stenosis.  4. The aortic valve was not well visualized. Aortic valve regurgitation is not visualized. No aortic stenosis is present.  5. The inferior vena cava is dilated in size with >50% respiratory variability, suggesting right atrial pressure of 8 mmHg.  6. Challenging image quality. FINDINGS  Left Ventricle: Left ventricular ejection fraction, by estimation, is 55 to 60%. The left ventricle has normal function. Left ventricular endocardial border not optimally defined to evaluate regional wall motion. Definity contrast agent was given IV to delineate the left ventricular endocardial borders. The left ventricular internal cavity size was normal in size. There is no left ventricular hypertrophy. Left ventricular diastolic parameters are consistent with Grade I diastolic dysfunction (impaired relaxation). Right Ventricle: The right ventricular size is normal. No increase in right ventricular wall thickness. Right ventricular systolic function is normal. Tricuspid regurgitation signal is inadequate for assessing PA pressure. Left Atrium: Left atrial size was normal in size. Right Atrium: Right atrial size was normal in size. Pericardium: There is no evidence of pericardial effusion. Mitral Valve: The mitral valve was not well visualized. No evidence of mitral valve regurgitation. No evidence of mitral valve stenosis. Tricuspid Valve: The tricuspid valve is not well visualized. Tricuspid valve regurgitation is not demonstrated. No evidence of tricuspid stenosis. Aortic Valve: The aortic  valve was not well visualized. Aortic valve regurgitation is not visualized. No aortic stenosis is present. Aortic valve peak gradient measures 2.6 mmHg. Pulmonic Valve: The pulmonic valve was not well visualized. Pulmonic valve regurgitation is not visualized. No evidence of pulmonic stenosis. Aorta: The aortic root was not well visualized. Venous: The inferior vena cava is dilated in size with greater than 50% respiratory variability, suggesting right atrial pressure of 8 mmHg. IAS/Shunts: No atrial level shunt detected by color flow Doppler.  LEFT VENTRICLE PLAX 2D LVIDd:         3.86 cm Diastology LVIDs:         2.67 cm LV e' lateral:   7.72 cm/s LV PW:         1.04 cm LV E/e' lateral: 6.1 LV IVS:        0.88 cm  LEFT ATRIUM           Index      RIGHT ATRIUM          Index LA Vol (A4C): 12.9 ml 7.95 ml/m RA Area:     7.62 cm  RA Volume:   15.90 ml 9.80 ml/m  AORTIC VALVE AV Vmax:      80.10 cm/s AV Peak Grad: 2.6 mmHg LVOT Vmax:    73.80 cm/s LVOT Vmean:   49.700 cm/s LVOT VTI:     0.104 m  AORTA Ao Root diam: 3.20 cm MITRAL VALVE MV Area (PHT): 2.36 cm    SHUNTS MV Decel Time: 322 msec    Systemic VTI: 0.10 m MV E velocity: 47.10 cm/s MV A velocity: 84.00 cm/s MV E/A ratio:  0.56 Lorine Bears MD Electronically signed by Lorine Bears MD Signature Date/Time: 09/12/2020/7:49:28 AM    Final

## 2020-09-19 NOTE — Progress Notes (Signed)
Initial Nutrition Assessment  INTERVENTION:   -Ensure Plus PO BID, each provides 350 kcals and 13g protein  -Multivitamin with minerals daily  NUTRITION DIAGNOSIS:   Increased nutrient needs related to acute illness as evidenced by estimated needs.  GOAL:   Patient will meet greater than or equal to 90% of their needs  MONITOR:   PO intake, Supplement acceptance, Labs, Weight trends, I & O's  REASON FOR ASSESSMENT:   Malnutrition Screening Tool    ASSESSMENT:   75 y.o. male with medical history significant for COPD, chronic O2 supplementation, GERD, presents emergency department for chief concerns of shortness of breath.  ER he was diagnosed with acute hypoxic respiratory failure due to COPD exacerbation, he was placed on BiPAP and admitted to the hospital. Recently COVID-19+ 8/9.  8/13: admitted  Patient consumed 50% of breakfast this morning. Very little PO documented between 8/16 and today. Palliative care to see for GOC given advanced COPD.  Will order Ensure supplements for additional kcals and protein.  Per weight records, pt has lost 7 lbs since 5/4 (5% wt loss x 3.5 months, insignificant for time frame).  Medications reviewed.  Labs reviewed.   NUTRITION - FOCUSED PHYSICAL EXAM:  Unable to complete at this time  Diet Order:   Diet Order             Diet Heart Room service appropriate? Yes; Fluid consistency: Thin  Diet effective now                   EDUCATION NEEDS:   No education needs have been identified at this time  Skin:  Skin Assessment: Skin Integrity Issues: Skin Integrity Issues:: Other (Comment) Other: 8/13 skin tear on arm  Last BM:  8/21 -type 3  Height:   Ht Readings from Last 1 Encounters:  09/10/20 5\' 3"  (1.6 m)    Weight:   Wt Readings from Last 1 Encounters:  09/11/20 60.1 kg    BMI:  Body mass index is 23.47 kg/m.  Estimated Nutritional Needs:   Kcal:  1550-1750  Protein:  75-90g  Fluid:   1.7L/day   09/13/20, MS, RD, LDN Inpatient Clinical Dietitian Contact information available via Amion

## 2020-09-19 NOTE — Progress Notes (Signed)
PT Cancellation Note  Patient Details Name: Charles Webster MRN: [id number] DOB: 08-16-1945   Cancelled Treatment:    Reason Eval/Treat Not Completed: Other (comment)  Offered and encouraged session.  Pt stated he just received "bad news" from the doctor and is upset.  He thought he would be able to return to [facility] but is now realizing he will not be able to care for himself at home.  He stated he is still getting up to the bathroom with staff but gait is limited due to SOB.  He declined session or offer of company at this time.  Will offer session again at a later time/date.   Danielle Dess 09/19/2020, 1:56 PM

## 2020-09-20 DIAGNOSIS — Z515 Encounter for palliative care: Secondary | ICD-10-CM | POA: Diagnosis not present

## 2020-09-20 DIAGNOSIS — R531 Weakness: Secondary | ICD-10-CM

## 2020-09-20 DIAGNOSIS — J441 Chronic obstructive pulmonary disease with (acute) exacerbation: Secondary | ICD-10-CM | POA: Diagnosis not present

## 2020-09-20 DIAGNOSIS — R0602 Shortness of breath: Secondary | ICD-10-CM | POA: Diagnosis not present

## 2020-09-20 DIAGNOSIS — J9621 Acute and chronic respiratory failure with hypoxia: Secondary | ICD-10-CM | POA: Diagnosis not present

## 2020-09-20 LAB — CBC WITH DIFFERENTIAL/PLATELET
Abs Immature Granulocytes: 0.1 10*3/uL — ABNORMAL HIGH (ref 0.00–0.07)
Basophils Absolute: 0 10*3/uL (ref 0.0–0.1)
Basophils Relative: 0 %
Eosinophils Absolute: 0 10*3/uL (ref 0.0–0.5)
Eosinophils Relative: 0 %
HCT: 39.9 % (ref 39.0–52.0)
Hemoglobin: 13.5 g/dL (ref 13.0–17.0)
Immature Granulocytes: 1 %
Lymphocytes Relative: 3 %
Lymphs Abs: 0.5 10*3/uL — ABNORMAL LOW (ref 0.7–4.0)
MCH: 31.1 pg (ref 26.0–34.0)
MCHC: 33.8 g/dL (ref 30.0–36.0)
MCV: 91.9 fL (ref 80.0–100.0)
Monocytes Absolute: 1.7 10*3/uL — ABNORMAL HIGH (ref 0.1–1.0)
Monocytes Relative: 10 %
Neutro Abs: 15.2 10*3/uL — ABNORMAL HIGH (ref 1.7–7.7)
Neutrophils Relative %: 86 %
Platelets: 338 10*3/uL (ref 150–400)
RBC: 4.34 MIL/uL (ref 4.22–5.81)
RDW: 13.2 % (ref 11.5–15.5)
WBC: 17.5 10*3/uL — ABNORMAL HIGH (ref 4.0–10.5)
nRBC: 0 % (ref 0.0–0.2)

## 2020-09-20 LAB — COMPREHENSIVE METABOLIC PANEL
ALT: 30 U/L (ref 0–44)
AST: 17 U/L (ref 15–41)
Albumin: 3.5 g/dL (ref 3.5–5.0)
Alkaline Phosphatase: 75 U/L (ref 38–126)
Anion gap: 10 (ref 5–15)
BUN: 80 mg/dL — ABNORMAL HIGH (ref 8–23)
CO2: 36 mmol/L — ABNORMAL HIGH (ref 22–32)
Calcium: 9.9 mg/dL (ref 8.9–10.3)
Chloride: 95 mmol/L — ABNORMAL LOW (ref 98–111)
Creatinine, Ser: 0.98 mg/dL (ref 0.61–1.24)
GFR, Estimated: >60 mL/min (ref >60)
Glucose, Bld: 109 mg/dL — ABNORMAL HIGH (ref 70–99)
Potassium: 3.9 mmol/L (ref 3.5–5.1)
Sodium: 141 mmol/L (ref 135–145)
Total Bilirubin: 1 mg/dL (ref 0.3–1.2)
Total Protein: 6.6 g/dL (ref 6.5–8.1)

## 2020-09-20 LAB — MAGNESIUM: Magnesium: 2.8 mg/dL — ABNORMAL HIGH (ref 1.7–2.4)

## 2020-09-20 LAB — BRAIN NATRIURETIC PEPTIDE: B Natriuretic Peptide: 77 pg/mL (ref 0.0–100.0)

## 2020-09-20 MED ORDER — METHYLPREDNISOLONE SODIUM SUCC 40 MG IJ SOLR
20.0000 mg | Freq: Every day | INTRAMUSCULAR | Status: DC
  Administered 2020-09-21: 20 mg via INTRAVENOUS
  Filled 2020-09-20: qty 1

## 2020-09-20 NOTE — Progress Notes (Signed)
Physical Therapy Treatment Patient Details Name: Charles Webster MRN: [id number] DOB: Sep 11, 1945 Today's Date: 09/20/2020    History of Present Illness 75 y.o. male with medical history significant for COPD, chronic O2 supplementation, GERD, presents emergency department for chief concerns of shortness of breath.  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    PT Comments    Pt was long sitting in bed with both children at bedside. He was on 5 L o2 throughout session. Does have slight SOB/WOB noted upon arriving. With encouragement, pt agrees top OOB activity. Pt is severely deconditioned and with minimal activity endorses fatigue. He was able to exit R side of bed, stand to RW, and takes a few steps to recliner. Increased WOB/SOB noted with prolonged recovery time however sao2 >91% and HR WFL throughout. Pt will benefit from rehab at DC to assist him with improving activity tolerance while assisting him to PLOF.    Follow Up Recommendations  SNF;Supervision/Assistance - 24 hour     Equipment Recommendations  Other (comment) (defer to next level of care)       Precautions / Restrictions Precautions Precautions: Fall Precaution Comments: Monitor O2/HR (pt on 5 L o2 throughout) Restrictions Weight Bearing Restrictions: No    Mobility  Bed Mobility Overal bed mobility: Needs Assistance Bed Mobility: Supine to Sit;Sit to Supine     Supine to sit: HOB elevated;Min guard     General bed mobility comments: increased time to perform with vcs for improved technque and sequencing    Transfers Overall transfer level: Needs assistance Equipment used: Rolling walker (2 wheeled) Transfers: Sit to/from Stand Sit to Stand: Min guard         General transfer comment: CGA for safety. vcs for handplacement and fwd wt shift  Ambulation/Gait Ambulation/Gait assistance: Min guard Gait Distance (Feet): 5  Feet Assistive device: Rolling walker (2 wheeled) Gait Pattern/deviations: Step-through pattern;Trunk flexed Gait velocity: decreased   General Gait Details: Pt ambulated ~ 5 ft from EOB to recliner. He does have increased WOB/SOB. prolonged recovery time. HR/sao2 stable on 5 L with sao2 dropping to 92% at lowest     Balance Overall balance assessment: Needs assistance Sitting-balance support: No upper extremity supported;Feet supported Sitting balance-Leahy Scale: Good     Standing balance support: During functional activity;Bilateral upper extremity supported Standing balance-Leahy Scale: Good      Cognition Arousal/Alertness: Awake/alert Behavior During Therapy: Flat affect;Anxious Overall Cognitive Status: Within Functional Limits for tasks assessed        General Comments: Pt is A and O x 4             Pertinent Vitals/Pain Pain Assessment: No/denies pain     PT Goals (current goals can now be found in the care plan section) Acute Rehab PT Goals Patient Stated Goal: breath better Progress towards PT goals: Progressing toward goals    Frequency    Min 2X/week      PT Plan Current plan remains appropriate       AM-PAC PT "6 Clicks" Mobility   Outcome Measure  Help needed turning from your back to your side while in a flat bed without using bedrails?: A Little Help needed moving from lying on your back to sitting on the side of a flat bed without using bedrails?: A Little Help needed moving to and from a bed to a chair (including a wheelchair)?: A Little Help needed standing up from  a chair using your arms (e.g., wheelchair or bedside chair)?: A Little Help needed to walk in hospital room?: A Lot Help needed climbing 3-5 steps with a railing? : A Lot 6 Click Score: 16    End of Session Equipment Utilized During Treatment: Oxygen (5 L throughout) Activity Tolerance: Patient limited by fatigue;Other (comment) (limited by activity tolerance) Patient  left: in chair;with call bell/phone within reach;with chair alarm set;with family/visitor present Nurse Communication: Mobility status PT Visit Diagnosis: Muscle weakness (generalized) (M62.81);Difficulty in walking, not elsewhere classified (R26.2)     Time: 1275-1700 PT Time Calculation (min) (ACUTE ONLY): 16 min  Charges:  $Therapeutic Activity: 8-22 mins                     Jetta Lout PTA 09/20/20, 2:51 PM

## 2020-09-20 NOTE — TOC Progression Note (Signed)
Transition of Care [facility]) - Progression Note    Patient Details  Name: Charles Webster MRN: [id number] Date of Birth: Mar 17, 1945  [facility] CM/SW Contact  [name], LCSW Phone Number: 09/20/2020, 1:32 PM  Clinical Narrative:  Per [name], NP with palliative team, patient now wants SNF placement for short-term rehab. Son's preference is [facility]. Left message for admissions coordinator to notify. Patient has not worked with PT since 8/17. Corrie Dandy said she reinforced working with therapy during their conversation.  Expected Discharge Plan: Home w Home Health Services Barriers to Discharge: Continued Medical Work up  Expected Discharge Plan and Services Expected Discharge Plan: Home w Home Health Services     Post Acute Care Choice: Home Health Living arrangements for the past 2 months: Single Family Home                           HH Arranged: RN, PT, OT, Nurse's Aide HH Agency: [facility] Date HH Agency Contacted: 09/15/20   Representative spoke with at [facility] Agency: [name]   Social Determinants of Health (SDOH) Interventions    Readmission Risk Interventions No flowsheet data found.

## 2020-09-20 NOTE — Progress Notes (Signed)
PROGRESS NOTE                                                                                                                                                                                                             Patient Demographics:    Charles Webster, is a 75 y.o. male, DOB - [birth year], ZOX:[id number]  Outpatient Primary MD for the patient is Maryjane Hurter Madaline Guthrie, MD    LOS - 9  Admit date - 09/10/2020    Chief Complaint  Patient presents with   Respiratory Distress       Brief Narrative (HPI from H&P)    Charles Webster is a 75 y.o. male with medical history significant for COPD, chronic O2 supplementation, GERD, presents emergency department for chief concerns of shortness of breath.  ER he was diagnosed with acute hypoxic respiratory failure due to COPD exacerbation, he was placed on BiPAP and admitted to the hospital.   Subjective:   Patient in bed appears to be in no distress says no headache chest or abdominal pain, shortness of breath is some better today.   Assessment  & Plan :     Acute Hypoxic Resp. Failure due to Acute on chronic COPD exacerbation along with acute on chronic CHF nonspecific, recent COVID-19 infection - he has most likely combination of COPD and CHF exacerbation, he with IV Lasix along with IV steroids and azithromycin for atypical coverage, initially on BiPAP for a few days now on 6-8 L nasal cannula oxygen, of note he uses 2-3 L oxygen at home as well at baseline, case discussed with patient's pulmonologist Dr. Belia Heman on 09/14/2020 - terminal and advanced COPD, very frail and deconditioned at baseline- per pulmonary appropriate for palliative care to be called for goals of care discussion - consulted 09/15/20. Encouraged the patient to sit up in chair in the daytime use I-S and flutter valve for pulmonary toiletry.  Will advance activity and titrate down oxygen as possible.  He has now finished his  azithromycin we will start tapering steroids gradually, IV Lasix being given on as-needed basis, overall marginally improved, but long-term prognosis remains poor, as recommended by pulmonologist will involve palliative care for long-term goals of care.  Family meeting today may decide to become home hospice in a day or 2.   2.  Recent COVID-19 infection in  a patient who is fully vaccinated and was treated with Paxil await.  Monitor inflammatory markers for now steroids should suffice.  I think main issue right now is COPD and CHF exacerbation.  3.  Hypertension.  Placed on Norvasc.  Monitor.  4.  Acute on diastolic CHF with EF 50%.  Echo noted, continue Lasix on as needed basis, now on Bystolic, chest pain-free .  5.  Recent right upper extremity wound present on admission.  Wound care team consulted.  6.  GERD.  On PPI.  7.  Mild Depression.  Placed on Paxil.    8. Diarrhea present on admission.  Resolved with supportive care.  9.  Run of nonsustained 13 beat asymptomatic V. tach.  EF 55%, electrolytes stable, in the light of his COPD have added Bystolic and monitor.   Recent Labs  Lab 09/14/20 0512 09/14/20 0523 09/15/20 0704 09/16/20 0539 09/17/20 0407 09/20/20 0755  WBC 15.9*  --  17.8* 13.9* 16.9* 17.5*  HGB 12.8*  --  14.0 13.9 14.4 13.5  HCT 37.8*  --  41.8 39.3 42.5 39.9  PLT 312  --  349 357 382 338  CRP  --  2.9* 1.3* 0.8 0.6  --   BNP  --   --   --   --   --  77.0  AST 24  --  ALT 32  --  36 34 37 30  ALKPHOS 70  --  73 71 80 75  BILITOT 0.9  --  0.8 0.9 1.2 1.0  ALBUMIN 3.4*  --  3.5 3.3* 3.7 3.5         Condition - Extremely Guarded  Family Communication  :  son over the phone 09/11/20, message left for son Lamoyne [phone] on his cell phone on 09/12/2020 at 10 AM, called 09/13/20 , daughter 09/16/20  Code Status :  Full  Consults  :  PCCM, palliative care  PUD Prophylaxis : PPI   Procedures  :     CTA -  1. No evidence of pulmonary  embolism. 2. 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 3. Marked severity emphysematous lung disease with mild to moderate severity left apical and bilateral lower lobe scarring and/or atelectasis  TTE -  1. Left ventricular ejection fraction, by estimation, is 55 to 60%. The left ventricle has normal function. Left ventricular endocardial border not optimally defined to evaluate regional wall motion. Left ventricular diastolic parameters are consistent with Grade I diastolic dysfunction (impaired relaxation).  2. Right ventricular systolic function is normal. The right ventricular size is normal. Tricuspid regurgitation signal is inadequate for assessing PA pressure.  3. The mitral valve was not well visualized. No evidence of mitral valve regurgitation. No evidence of mitral stenosis.  4. The aortic valve was not well visualized. Aortic valve regurgitation is not visualized. No aortic stenosis is present.  5. The inferior vena cava is dilated in size with >50% respiratory variability, suggesting right atrial pressure of 8 mmHg.  6. Challenging image quality.      Disposition Plan  :    Status is: Inpatient  Remains inpatient appropriate because:IV treatments appropriate due to intensity of illness or inability to take PO  Dispo: The patient is from: Home              Anticipated d/c is to: Home              Patient currently  is not medically stable to d/c.   Difficult to place patient No  DVT Prophylaxis  :    enoxaparin (LOVENOX) injection 40 mg Start: 09/10/20 2200 Place TED hose Start: 09/10/20 2129    Lab Results  Component Value Date   PLT 338 09/20/2020    Diet :  Diet Order             Diet Heart Room service appropriate? Yes; Fluid consistency: Thin  Diet effective now                    Inpatient Medications  Scheduled Meds:  albuterol  2 puff Inhalation Q6H   amLODipine  10 mg Oral Daily    enoxaparin (LOVENOX) injection  40 mg Subcutaneous Q24H   feeding supplement  237 mL Oral BID BM   fluticasone  1 spray Each Nare Daily   ipratropium  2 puff Inhalation Q6H   [START ON 09/21/2020] methylPREDNISolone (SOLU-MEDROL) injection  20 mg Intravenous Daily   mometasone-formoterol  2 puff Inhalation BID   multivitamin with minerals  1 tablet Oral Daily   mupirocin ointment   Nasal BID   nebivolol  5 mg Oral Daily   nystatin  5 mL Mouth/Throat QID   pantoprazole  80 mg Oral Daily   PARoxetine  10 mg Oral Daily   Continuous Infusions:   PRN Meds:.hydrALAZINE, LORazepam, [DISCONTINUED] ondansetron **OR** ondansetron (ZOFRAN) IV, traZODone  Antibiotics  :    Anti-infectives (From admission, onward)    Start     Dose/Rate Route Frequency Ordered Stop   09/17/20 1000  azithromycin (ZITHROMAX) tablet 250 mg        250 mg Oral Daily 09/16/20 1320 09/17/20 0952   09/15/20 1000  azithromycin (ZITHROMAX) tablet 250 mg        250 mg Oral Daily 09/14/20 1551 09/16/20 1047   09/13/20 1015  azithromycin (ZITHROMAX) tablet 250 mg        250 mg Oral Daily 09/13/20 0929 09/14/20 0952   09/11/20 1800  azithromycin (ZITHROMAX) 500 mg in sodium chloride 0.9 % 250 mL IVPB        500 mg 250 mL/hr over 60 Minutes Intravenous Every 24 hours 09/10/20 2131 09/12/20 1850   09/10/20 1730  cefTRIAXone (ROCEPHIN) 2 g in sodium chloride 0.9 % 100 mL IVPB  Status:  Discontinued        2 g 200 mL/hr over 30 Minutes Intravenous Every 24 hours 09/10/20 1728 09/10/20 2131   09/10/20 1730  azithromycin (ZITHROMAX) 500 mg in sodium chloride 0.9 % 250 mL IVPB  Status:  Discontinued        500 mg 250 mL/hr over 60 Minutes Intravenous Every 24 hours 09/10/20 1728 09/10/20 2131        Time Spent in minutes  30   Susa Raring M.D on 09/20/2020 at 10:39 AM  To page go to [email or web address]   [facility] -  Office  [phone]   See all Orders from today for further details    Objective:    Vitals:   09/19/20 1936 09/20/20 0439 09/20/20 0458 09/20/20 0749  BP: 120/76 110/86 122/78 124/76  Pulse: 71 71 80 78  Resp: (!) 22 20 20 16   Temp: 98.5 F (36.9 C) 98.1 F (36.7 C) 98 F (36.7 C) 97.6 F (36.4 C)  TempSrc: Oral Oral Oral Oral  SpO2: 99% 99% 92% 96%  Weight:      Height:  Wt Readings from Last 3 Encounters:  09/11/20 60.1 kg  06/01/20 63.4 kg  10/22/18 60.3 kg     Intake/Output Summary (Last 24 hours) at 09/20/2020 1039 Last data filed at 09/20/2020 0751 Gross per 24 hour  Intake 0 ml  Output 850 ml  Net -850 ml     Physical Exam  Awake Alert, No new F.N deficits, Normal affect [place].AT,PERRAL Supple Neck,No JVD, No cervical lymphadenopathy appriciated.  Symmetrical Chest wall movement, Good air movement bilaterally, CTAB RRR,No Gallops, Rubs or new Murmurs, No Parasternal Heave +ve B.Sounds, Abd Soft, No tenderness, No organomegaly appriciated, No rebound - guarding or rigidity. No Cyanosis, Clubbing or edema, No new Rash or bruise    Data Review:    CBC Recent Labs  Lab 09/14/20 0512 09/15/20 0704 09/16/20 0539 09/17/20 0407 09/20/20 0755  WBC 15.9* 17.8* 13.9* 16.9* 17.5*  HGB 12.8* 14.0 13.9 14.4 13.5  HCT 37.8* 41.8 39.3 42.5 39.9  PLT 312 349 357 382 338  MCV 92.2 93.7 90.1 90.8 91.9  MCH 31.2 31.4 31.9 30.8 31.1  MCHC 33.9 33.5 35.4 33.9 33.8  RDW 13.9 13.8 13.5 13.3 13.2  LYMPHSABS 0.3* 0.4* 0.2* 0.2* 0.5*  MONOABS 0.7 0.8 0.6 0.7 1.7*  EOSABS 0.0 0.0 0.0 0.0 0.0  BASOSABS 0.0 0.0 0.0 0.0 0.0    Recent Labs  Lab 09/14/20 0512 09/14/20 0523 09/15/20 0704 09/16/20 0539 09/17/20 0407 09/18/20 0401 09/20/20 0755  NA 137  --  137 135 134* 136 141  K 4.1  --  4.6 4.4 5.0 4.4 3.9  CL 96*  --  95* 92* 92* 92* 95*  CO2 31  --  30 35* 31 33* 36*  GLUCOSE 138*  --  130* 131* 128* 113* 109*  BUN 51*  --  49* 52* 55* 64* 80*  CREATININE 1.05  --  0.99 1.12 1.13 1.11 0.98  CALCIUM 9.3  --  9.5 9.4 9.6 9.7 9.9  AST  24  --  --  17  ALT 32  --  36 34 37  --  30  ALKPHOS 70  --  73 71 80  --  75  BILITOT 0.9  --  0.8 0.9 1.2  --  1.0  ALBUMIN 3.4*  --  3.5 3.3* 3.7  --  3.5  MG  --   --   --  2.6*  --   --  2.8*  CRP  --  2.9* 1.3* 0.8 0.6  --   --   BNP  --   --   --   --   --   --  77.0    ------------------------------------------------------------------------------------------------------------------ No results for input(s): CHOL, HDL, LDLCALC, TRIG, CHOLHDL, LDLDIRECT in the last 72 hours.  No results found for: HGBA1C ------------------------------------------------------------------------------------------------------------------ No results for input(s): TSH, T4TOTAL, T3FREE, THYROIDAB in the last 72 hours.  Invalid input(s): FREET3  Cardiac Enzymes No results for input(s): CKMB, TROPONINI, MYOGLOBIN in the last 168 hours.  Invalid input(s): CK ------------------------------------------------------------------------------------------------------------------    Component Value Date/Time   BNP 77.0 09/20/2020 0755     Radiology Reports CT Angio Chest Pulmonary Embolism (PE) W or WO Contrast  Result Date: 09/12/2020 CLINICAL DATA:  Suspected pulmonary embolism. EXAM: CT ANGIOGRAPHY CHEST WITH CONTRAST TECHNIQUE: Multidetector CT imaging of the chest was performed using the standard protocol during bolus administration of intravenous contrast. Multiplanar CT image reconstructions and MIPs were obtained to evaluate the vascular anatomy. CONTRAST:  75mL OMNIPAQUE IOHEXOL  350 MG/ML SOLN COMPARISON:  None. FINDINGS: Cardiovascular: There is mild to moderate severity calcification of the aortic arch. Satisfactory opacification of the pulmonary arteries to the segmental level. No evidence of pulmonary embolism. Normal heart size with moderate severity coronary artery calcification. No pericardial effusion. Mediastinum/Nodes: Small, partially calcified pretracheal and bilateral hilar nodes  are seen. Thyroid gland, trachea, and esophagus demonstrate no significant findings. Lungs/Pleura: There is marked severity emphysematous lung disease. Postoperative changes are seen on the right, consistent with the patient's history of partial right lobectomy. Subsequent right-sided volume loss is seen with left to right shift of the superior mediastinal structures. A 3.4 cm x 2.5 cm x 4.1 cm area of fluid attenuation, with a thin partially calcified surrounding wall, is seen within the posterior aspect of the upper right lung (approximately 21 Hounsfield units). This is also likely postoperative in origin. Mild to moderate severity areas of scarring and atelectasis are seen along the left apex and posterior aspect of the bilateral lower lobes. Similar appearing changes are seen within the anterior aspect of the upper right lung. No pneumothorax is identified. Upper Abdomen: No acute abnormality. Musculoskeletal: Chronic and postoperative deformities are seen involving multiple right-sided ribs. Chronic fourth, fifth and sixth left rib fractures are seen. Review of the MIP images confirms the above findings. IMPRESSION: 1. No evidence of pulmonary embolism. 2. 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 3. Marked severity emphysematous lung disease with mild to moderate severity left apical and bilateral lower lobe scarring and/or atelectasis. Electronically Signed   By: Aram Candela M.D.   On: 09/12/2020 01:17   DG Chest Port 1 View  Result Date: 09/19/2020 CLINICAL DATA:  75 year old male with a history of shortness of breath EXAM: PORTABLE CHEST 1 VIEW COMPARISON:  09/17/2020 FINDINGS: Cardiomediastinal silhouette unchanged in size and contour. No evidence of central vascular congestion. No interlobular septal thickening. Architectural distortion again demonstrated with reticular pattern of opacities at the bilateral lung bases, postsurgical  changes in the right hilar/suprahilar region, and pleuroparenchymal thickening at the right apex. Emphysematous changes on the anterior view. No pneumothorax. Coarsened interstitial markings, with no new confluent airspace disease. No acute displaced fracture. Partially healed left-sided rib fractures IMPRESSION: Similar appearance of the chest x-ray, with chronic changes/scarring, postoperative changes, and emphysema. Electronically Signed   By: Gilmer Mor D.O.   On: 09/19/2020 08:47   DG Chest Port 1 View  Result Date: 09/17/2020 CLINICAL DATA:  Shortness of breath and COVID infection for 1 week. COPD EXAM: PORTABLE CHEST 1 VIEW COMPARISON:  09/14/2020 FINDINGS: Tracheal deviation right. Normal heart size. Trace right pleural fluid or thickening, with similar blunting of the costophrenic angle. Extensive surgical changes in the superior right hemithorax, with resultant volume loss. Underlying advanced emphysema. No pneumothorax. Bibasilar pulmonary opacities are favored to represent areas of scarring and not significantly changed. No new pulmonary opacity. Remote left rib fractures. IMPRESSION: Relatively similar appearance of the chest since 09/14/2020. Surgical changes and volume loss in the right hemithorax with similar pleural fluid or thickening and bibasilar scarring. Advanced emphysema. Electronically Signed   By: Jeronimo Greaves M.D.   On: 09/17/2020 08:11   DG Chest Port 1 View  Result Date: 09/14/2020 CLINICAL DATA:  Shortness of breath; history COPD, bronchitis, GERD, hiatal hernia, former smoker EXAM: PORTABLE CHEST 1 VIEW COMPARISON:  Portable exam 0752 hours compared to 09/12/2020 radiographic and CT angio chest exams FINDINGS: Normal heart size and pulmonary vascularity. Volume loss  in RIGHT hemithorax with mediastinal shift to the RIGHT. Atherosclerotic calcification aorta. Postsurgical changes and marked volume loss in RIGHT upper lobe with persistent pleuroparenchymal thickening at apex.  Underlying emphysematous and chronic bronchitic changes. Bibasilar interstitial infiltrates versus chronic interstitial lung disease. Calcified granulomata LEFT upper lobe. Remaining lungs clear. Trace RIGHT pleural effusion. No pneumothorax. Bones demineralized with old fractures of the anterior LEFT fourth fifth and sixth ribs. IMPRESSION: Postoperative changes of the upper RIGHT hemithorax with volume loss in RIGHT chest. COPD changes with RIGHT upper lobe scarring. Bibasilar shall infiltrates versus chronic interstitial lung disease unchanged. Aortic Atherosclerosis (ICD10-I70.0) and Emphysema (ICD10-J43.9). Electronically Signed   By: Ulyses SouthwardMark  Boles M.D.   On: 09/14/2020 08:31   DG Chest Port 1 View  Result Date: 09/12/2020 CLINICAL DATA:  Respiratory distress. EXAM: PORTABLE CHEST 1 VIEW COMPARISON:  CTA chest from same day.  Chest x-ray from yesterday. FINDINGS: Normal heart size. Similar postsurgical changes, scarring, and volume loss in the right lung apex with rightward tracheal shift. Chronically coarsened interstitial markings again noted with lung hyperinflation and emphysematous changes. Similar reticulonodular densities at both lung bases. Similar scarring at the right costophrenic angle. No acute osseous abnormality. IMPRESSION: 1. Similar mild reticulonodular densities at both lung bases which may be infectious or inflammatory. 2. COPD with stable postsurgical changes, scarring, and volume loss in the right lung apex. Electronically Signed   By: Obie DredgeWilliam T Derry M.D.   On: 09/12/2020 07:41   DG Chest Port 1 View  Result Date: 09/11/2020 CLINICAL DATA:  Respiratory distress EXAM: PORTABLE CHEST 1 VIEW COMPARISON:  None. FINDINGS: Normal mediastinum and cardiac silhouette. There is volume loss in the RIGHT hemithorax related to prior surgery. There is mild airspace disease at the lung bases. No pneumothorax. No pleural fluid. IMPRESSION: Bibasilar mild airspace disease suggests pulmonary edema.  Postsurgical volume loss in the RIGHT hemithorax Electronically Signed   By: Genevive BiStewart  Edmunds M.D.   On: 09/11/2020 07:33   DG Chest Port 1 View  Result Date: 09/10/2020 CLINICAL DATA:  Possible sepsis, history of COVID-19 positivity EXAM: PORTABLE CHEST 1 VIEW COMPARISON:  11/21/2017 FINDINGS: Cardiac shadow is within normal limits. The lungs are hyperinflated. Persistent scarring and volume loss is noted in the right upper lobe related to prior surgery and stable. Will rib fractures are noted on the right with healing. Mild atelectasis versus scarring is noted in the left base. No confluent focal infiltrate is seen. Aortic calcifications are noted. IMPRESSION: Chronic appearing changes. COPD without acute abnormality. Electronically Signed   By: Alcide CleverMark  Lukens M.D.   On: 09/10/2020 17:55   ECHOCARDIOGRAM COMPLETE  Result Date: 09/12/2020    ECHOCARDIOGRAM REPORT   Patient Name:   Charles Webster Date of Exam: 09/11/2020 Medical Rec #:  [id number]     Height:       63.0 in Accession #:    [id number]    Weight:       132.5 lb Date of Birth:  Jan 07, 1946    BSA:          1.623 m Patient Age:    74 years      BP:           170/94 mmHg Patient Gender: M             HR:           96 bpm. Exam Location:  [facility] Procedure: 2D Echo and Intracardiac Opacification Agent Indications:     CHF I50.31  History:  Patient has no prior history of Echocardiogram examinations.  Sonographer:     Overton Mam RDCS Referring Phys:  Effie Shy [place] [facility] Diagnosing Phys: Lorine Bears MD  Sonographer Comments: Technically challenging study due to limited acoustic windows, suboptimal parasternal window, suboptimal apical window and suboptimal subcostal window. IMPRESSIONS  1. Left ventricular ejection fraction, by estimation, is 55 to 60%. The left ventricle has normal function. Left ventricular endocardial border not optimally defined to evaluate regional wall motion. Left ventricular diastolic parameters are consistent with  Grade I diastolic dysfunction (impaired relaxation).  2. Right ventricular systolic function is normal. The right ventricular size is normal. Tricuspid regurgitation signal is inadequate for assessing PA pressure.  3. The mitral valve was not well visualized. No evidence of mitral valve regurgitation. No evidence of mitral stenosis.  4. The aortic valve was not well visualized. Aortic valve regurgitation is not visualized. No aortic stenosis is present.  5. The inferior vena cava is dilated in size with >50% respiratory variability, suggesting right atrial pressure of 8 mmHg.  6. Challenging image quality. FINDINGS  Left Ventricle: Left ventricular ejection fraction, by estimation, is 55 to 60%. The left ventricle has normal function. Left ventricular endocardial border not optimally defined to evaluate regional wall motion. Definity contrast agent was given IV to delineate the left ventricular endocardial borders. The left ventricular internal cavity size was normal in size. There is no left ventricular hypertrophy. Left ventricular diastolic parameters are consistent with Grade I diastolic dysfunction (impaired relaxation). Right Ventricle: The right ventricular size is normal. No increase in right ventricular wall thickness. Right ventricular systolic function is normal. Tricuspid regurgitation signal is inadequate for assessing PA pressure. Left Atrium: Left atrial size was normal in size. Right Atrium: Right atrial size was normal in size. Pericardium: There is no evidence of pericardial effusion. Mitral Valve: The mitral valve was not well visualized. No evidence of mitral valve regurgitation. No evidence of mitral valve stenosis. Tricuspid Valve: The tricuspid valve is not well visualized. Tricuspid valve regurgitation is not demonstrated. No evidence of tricuspid stenosis. Aortic Valve: The aortic valve was not well visualized. Aortic valve regurgitation is not visualized. No aortic stenosis is present.  Aortic valve peak gradient measures 2.6 mmHg. Pulmonic Valve: The pulmonic valve was not well visualized. Pulmonic valve regurgitation is not visualized. No evidence of pulmonic stenosis. Aorta: The aortic root was not well visualized. Venous: The inferior vena cava is dilated in size with greater than 50% respiratory variability, suggesting right atrial pressure of 8 mmHg. IAS/Shunts: No atrial level shunt detected by color flow Doppler.  LEFT VENTRICLE PLAX 2D LVIDd:         3.86 cm Diastology LVIDs:         2.67 cm LV e' lateral:   7.72 cm/s LV PW:         1.04 cm LV E/e' lateral: 6.1 LV IVS:        0.88 cm  LEFT ATRIUM           Index      RIGHT ATRIUM          Index LA Vol (A4C): 12.9 ml 7.95 ml/m RA Area:     7.62 cm                                  RA Volume:   15.90 ml 9.80 ml/m  AORTIC VALVE AV Vmax:  80.10 cm/s AV Peak Grad: 2.6 mmHg LVOT Vmax:    73.80 cm/s LVOT Vmean:   49.700 cm/s LVOT VTI:     0.104 m  AORTA Ao Root diam: 3.20 cm MITRAL VALVE MV Area (PHT): 2.36 cm    SHUNTS MV Decel Time: 322 msec    Systemic VTI: 0.10 m MV E velocity: 47.10 cm/s MV A velocity: 84.00 cm/s MV E/A ratio:  0.56 Lorine Bears MD Electronically signed by Lorine Bears MD Signature Date/Time: 09/12/2020/7:49:28 AM    Final

## 2020-09-20 NOTE — Progress Notes (Signed)
Occupational Therapy Treatment Patient Details Name: Charles Webster MRN: [id number] DOB: August 12, 1945 Today's Date: 09/20/2020    History of present illness 75 y.o. male with medical history significant for COPD, chronic O2 supplementation, GERD, presents emergency department for chief concerns of shortness of breath.  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   OT comments  Upon entering the room, pt seated in recliner chair with family present in the room. Pt has been up in chair for ~ 45 minutes at this time. Pt reports fatigue and dizziness. Vitals checked with BP of 96/62 seated in recliner chair, HR ranging from 40- 150's ( RN notified and she reports hx of A-fib), and O2 saturation remains 90% or above while on 6 L O2 via [place]. Pt standing with min guard and transfers to bed with min HHA. Sit >supine with min guard. OT changing recommendation to short term rehab and discussing that recommendation further with pt and family in the room. Pt continues to benefit from OT intervention. All needs within reach.     Follow Up Recommendations  SNF;Supervision - Intermittent    Equipment Recommendations  Other (comment) (defer to next venue of care)       Precautions / Restrictions Precautions Precautions: Fall Precaution Comments: Monitor O2/HR Restrictions Weight Bearing Restrictions: No       Mobility Bed Mobility Overal bed mobility: Needs Assistance Bed Mobility: Sit to Supine     Supine to sit: HOB elevated;Min guard Sit to supine: Min guard   General bed mobility comments: increased time and min cuing for technique    Transfers Overall transfer level: Needs assistance Equipment used: 1 person hand held assist Transfers: Sit to/from [employer] Sit to Stand: Min guard Stand pivot transfers: Min assist       General transfer comment: CGA for safety. vcs for handplacement and  fwd wt shift    Balance Overall balance assessment: Needs assistance Sitting-balance support: No upper extremity supported;Feet supported Sitting balance-Leahy Scale: Good     Standing balance support: During functional activity;Bilateral upper extremity supported Standing balance-Leahy Scale: Fair                             ADL either performed or assessed with clinical judgement     Vision Patient Visual Report: No change from baseline            Cognition Arousal/Alertness: Awake/alert Behavior During Therapy: Flat affect;Anxious Overall Cognitive Status: Within Functional Limits for tasks assessed                                 General Comments: Pt is A and O x 4                   Pertinent Vitals/ Pain       Pain Assessment: No/denies pain         Frequency  Min 2X/week        Progress Toward Goals  OT Goals(current goals can now be found in the care plan section)  Progress towards OT goals: Progressing toward goals  Acute Rehab OT Goals Patient Stated Goal: breathe better OT Goal Formulation: With patient/family Time For Goal Achievement: 09/29/20 Potential to Achieve Goals: Good  Plan Discharge plan needs to be updated;Frequency remains appropriate  AM-PAC OT "6 Clicks" Daily Activity     Outcome Measure   Help from another person eating meals?: None Help from another person taking care of personal grooming?: A Little Help from another person toileting, which includes using toliet, bedpan, or urinal?: A Little Help from another person bathing (including washing, rinsing, drying)?: A Little Help from another person to put on and taking off regular upper body clothing?: A Little Help from another person to put on and taking off regular lower body clothing?: A Little 6 Click Score: 19    End of Session Equipment Utilized During Treatment: Oxygen  OT Visit Diagnosis: Other abnormalities of gait and mobility  (R26.89);Muscle weakness (generalized) (M62.81)   Activity Tolerance Patient tolerated treatment well   Patient Left in bed;with call bell/phone within reach;with bed alarm set   Nurse Communication Mobility status        Time: 1442-1500 OT Time Calculation (min): 18 min  Charges: OT General Charges $OT Visit: 1 Visit OT Treatments $Therapeutic Activity: 8-22 mins  Jackquline Denmark, MS, OTR/L , CBIS ascom [phone]  09/20/20, 3:47 PM

## 2020-09-20 NOTE — Progress Notes (Addendum)
Patient ID: [name], male   DOB: 18-Jun-1945, 75 y.o.   MRN: [id number]    Progress Note from the Palliative Medicine Team at [facility]   Patient Name: Charles Webster        Date: 09/20/2020 DOB: 1945-02-29  Age: 75 y.o. MRN#: [id number] Attending Physician: Thurnell Lose, MD Primary Care Physician: Sofie Hartigan, MD Admit Date: 09/10/2020   Medical records reviewed   75 y.o. male  admitted on 09/10/2020 with past  medical history significant for COPD, chronic O2 supplementation, recent Covid, GERD, presents emergency department for chief concerns of shortness of breath.   In ER he was diagnosed with acute hypoxic respiratory failure due to COPD exacerbation, he was placed on BiPAP and admitted to the hospital.   Today is day 9 of this hospital stay.  Patient remains very frail and deconditioned, long-term prognosis is likely limited.   Patient and  family face treatment option decisions, advanced directive decisions and anticipatory care needs.  This NP visited patient at the bedside as a follow up to  yesterday's [place], and to meet as scheduled with his daughter Addison Bailey and son/ JW for continued conversation regarding current medical situation and the neck steps in transition of care.  I met with both patient and his son and daughter.  Detailed conversation and education regarding current medical situation, diagnoses, COPD as the disease process and its trajectory, and medical management of COPD  Both patient and his children verbalized an understanding of the seriousness of his current medical situation and his high risk for decompensation.  However at this time all remain hopeful for improvement, they are open to all offered and available medical interventions to prolong life.   Patient does not believe that his condition is end-stage or terminal and tells me "I have more life left in me".  Conversation and education had today regarding best case scenario versus  worst-case scenario.  In light of patient's high risk for decompensation education offered on hospice benefit.  Goals of care document completed  Plan of Care: -DNR/DNI -Continue to treat the treatable and hope for improvement -SNF for short-term rehab -Recommend outpatient community-based palliative to follow at SNF    Discussed with patient the importance of continued conversation with family and their  medical providers regarding overall plan of care and treatment options,  ensuring decisions are within the context of the patients values and GOCs.  Questions and concerns addressed   Discussed with Dr Candiss Norse  Total time spent on the unit was 75  minutes  Greater than 50% of the time was spent in counseling and coordination of care  Wadie Lessen NP  Palliative Medicine Team Team Phone # [phone] Pager [phone]

## 2020-09-20 NOTE — Progress Notes (Signed)
Mobility Specialist - Progress Note   09/20/20 1400  Mobility  Activity Refused mobility  Mobility performed by Mobility specialist    Pt politely declined mobility. Just finished getting up with PT prior to entry, requesting to rest at this time. Will attempt session another date/time.    Filiberto Pinks Mobility Specialist 09/20/20, 2:05 PM

## 2020-09-20 NOTE — NC FL2 (Signed)
[place] MEDICAID FL2 LEVEL OF CARE SCREENING TOOL     IDENTIFICATION  Patient Name: Charles Webster Birthdate: 03/13/1945 Sex: male Admission Date (Current Location): 09/10/2020  [facility] and [place] Number:  Chiropodist and Address:  [facility], [address], [place], [place] [address]      Provider Number: [id number]  Attending Physician Name and Address:  Leroy Sea, MD  Relative Name and Phone Number:       Current Level of Care: Hospital Recommended Level of Care: Skilled Nursing Facility Prior Approval Number:    Date Approved/Denied:   PASRR Number: [id number]  Discharge Plan: SNF    Current Diagnoses: Patient Active Problem List   Diagnosis Date Noted   Palliative care by specialist    Shortness of breath 09/10/2020   COVID-19 virus infection 09/10/2020   COPD exacerbation (HCC) 05/05/2016   Allergic rhinitis 07/14/2013   Duodenitis 12/04/2012   Hiatal hernia 12/04/2012    Orientation RESPIRATION BLADDER Height & Weight     Self, Time, Situation, Place  O2 (Nasal Canula 5 L. Hasn't been on bipap since 8/15.) Incontinent, External catheter Weight: 132 lb 7.9 oz (60.1 kg) Height:  5\' 3"  (160 cm)  BEHAVIORAL SYMPTOMS/MOOD NEUROLOGICAL BOWEL NUTRITION STATUS   (None)  (None) Continent Diet (Heart healthy)  AMBULATORY STATUS COMMUNICATION OF NEEDS Skin   Limited Assist Verbally Skin abrasions, Bruising                       Personal Care Assistance Level of Assistance  Bathing, Feeding, Dressing Bathing Assistance: Limited assistance Feeding assistance: Independent Dressing Assistance: Limited assistance     Functional Limitations Info  Sight, Hearing, Speech Sight Info: Adequate Hearing Info: Adequate Speech Info: Adequate    SPECIAL CARE FACTORS FREQUENCY  OT (By licensed OT)     PT Frequency: 5 x week OT Frequency: 5 x week            Contractures Contractures Info: Not present     Additional Factors Info  Code Status, Allergies Code Status Info: DNR Allergies Info: Alpha Blocker Quinazolines, Clindamycin/lincomycin, Doxycycline, Levaquin (Levofloxacin), Penicillins, Singulair (Montelukast Sodium), Sulfa Antibiotics, Tamsulosin           Current Medications (09/20/2020):  This is the current hospital active medication list Current Facility-Administered Medications  Medication Dose Route Frequency Provider Last Rate Last Admin   albuterol (VENTOLIN HFA) 108 (90 Base) MCG/ACT inhaler 2 puff  2 puff Inhalation Q6H Kasa, Kurian, MD   2 puff at 09/20/20 0849   amLODipine (NORVASC) tablet 10 mg  10 mg Oral Daily 09/22/20, MD   10 mg at 09/20/20 0849   enoxaparin (LOVENOX) injection 40 mg  40 mg Subcutaneous Q24H Cox, Amy N, DO   40 mg at 09/19/20 2201   feeding supplement (ENSURE ENLIVE / ENSURE PLUS) liquid 237 mL  237 mL Oral BID BM 2202, MD   237 mL at 09/20/20 0901   fluticasone (FLONASE) 50 MCG/ACT nasal spray 1 spray  1 spray Each Nare Daily 09/22/20, MD   1 spray at 09/20/20 0902   hydrALAZINE (APRESOLINE) tablet 10 mg  10 mg Oral Q6H PRN Cox, Amy N, DO       ipratropium (ATROVENT HFA) inhaler 2 puff  2 puff Inhalation Q6H 09/22/20, MD   2 puff at 09/20/20 0858   LORazepam (ATIVAN) tablet 0.5 mg  0.5 mg Oral BID PRN  Leroy Sea, MD   0.5 mg at 09/15/20 2028   [START ON 09/21/2020] methylPREDNISolone sodium succinate (SOLU-MEDROL) 40 mg/mL injection 20 mg  20 mg Intravenous Daily Leroy Sea, MD       mometasone-formoterol [facility]) 200-5 MCG/ACT inhaler 2 puff  2 puff Inhalation BID Salena Saner, MD   2 puff at 09/20/20 0902   multivitamin with minerals tablet 1 tablet  1 tablet Oral Daily Leroy Sea, MD   1 tablet at 09/20/20 2423   mupirocin ointment (BACTROBAN) 2 %   Nasal BID Charlotte Sanes, MD   Given at 09/20/20 0902   nebivolol (BYSTOLIC) tablet 5 mg  5 mg Oral Daily Leroy Sea, MD   5 mg  at 09/20/20 0849   nystatin (MYCOSTATIN) 100000 UNIT/ML suspension 500,000 Units  5 mL Mouth/Throat QID Leroy Sea, MD   500,000 Units at 09/20/20 0849   ondansetron (ZOFRAN) injection 4 mg  4 mg Intravenous Q6H PRN Cox, Amy N, DO       pantoprazole (PROTONIX) EC tablet 80 mg  80 mg Oral Daily Cox, Amy N, DO   80 mg at 09/20/20 0849   PARoxetine (PAXIL) tablet 10 mg  10 mg Oral Daily Leroy Sea, MD   10 mg at 09/20/20 0850   traZODone (DESYREL) tablet 25 mg  25 mg Oral QHS PRN Mansy, Jan A, MD   25 mg at 09/12/20 0129     Discharge Medications: Please see discharge summary for a list of discharge medications.  Relevant Imaging Results:  Relevant Lab Results:   Additional Information SS#: [id number]. COVID Vaccines Pfizer 04/01/19, 04/22/19. Need to find out if he's had booster.  [name], LCSW

## 2020-09-21 DIAGNOSIS — J441 Chronic obstructive pulmonary disease with (acute) exacerbation: Secondary | ICD-10-CM | POA: Diagnosis not present

## 2020-09-21 LAB — CBC WITH DIFFERENTIAL/PLATELET
Abs Immature Granulocytes: 0.11 10*3/uL — ABNORMAL HIGH (ref 0.00–0.07)
Basophils Absolute: 0 10*3/uL (ref 0.0–0.1)
Basophils Relative: 0 %
Eosinophils Absolute: 0 10*3/uL (ref 0.0–0.5)
Eosinophils Relative: 0 %
HCT: 39.1 % (ref 39.0–52.0)
Hemoglobin: 13.1 g/dL (ref 13.0–17.0)
Immature Granulocytes: 1 %
Lymphocytes Relative: 3 %
Lymphs Abs: 0.5 10*3/uL — ABNORMAL LOW (ref 0.7–4.0)
MCH: 30.4 pg (ref 26.0–34.0)
MCHC: 33.5 g/dL (ref 30.0–36.0)
MCV: 90.7 fL (ref 80.0–100.0)
Monocytes Absolute: 1.4 10*3/uL — ABNORMAL HIGH (ref 0.1–1.0)
Monocytes Relative: 8 %
Neutro Abs: 15.3 10*3/uL — ABNORMAL HIGH (ref 1.7–7.7)
Neutrophils Relative %: 88 %
Platelets: 331 10*3/uL (ref 150–400)
RBC: 4.31 MIL/uL (ref 4.22–5.81)
RDW: 13.2 % (ref 11.5–15.5)
WBC: 17.2 10*3/uL — ABNORMAL HIGH (ref 4.0–10.5)
nRBC: 0 % (ref 0.0–0.2)

## 2020-09-21 LAB — COMPREHENSIVE METABOLIC PANEL
ALT: 34 U/L (ref 0–44)
AST: 20 U/L (ref 15–41)
Albumin: 3.2 g/dL — ABNORMAL LOW (ref 3.5–5.0)
Alkaline Phosphatase: 72 U/L (ref 38–126)
Anion gap: 11 (ref 5–15)
BUN: 68 mg/dL — ABNORMAL HIGH (ref 8–23)
CO2: 33 mmol/L — ABNORMAL HIGH (ref 22–32)
Calcium: 9.9 mg/dL (ref 8.9–10.3)
Chloride: 94 mmol/L — ABNORMAL LOW (ref 98–111)
Creatinine, Ser: 1.02 mg/dL (ref 0.61–1.24)
GFR, Estimated: >60 mL/min (ref >60)
Glucose, Bld: 117 mg/dL — ABNORMAL HIGH (ref 70–99)
Potassium: 3.8 mmol/L (ref 3.5–5.1)
Sodium: 138 mmol/L (ref 135–145)
Total Bilirubin: 1 mg/dL (ref 0.3–1.2)
Total Protein: 6.1 g/dL — ABNORMAL LOW (ref 6.5–8.1)

## 2020-09-21 LAB — MAGNESIUM: Magnesium: 2.7 mg/dL — ABNORMAL HIGH (ref 1.7–2.4)

## 2020-09-21 LAB — BRAIN NATRIURETIC PEPTIDE: B Natriuretic Peptide: 93.5 pg/mL (ref 0.0–100.0)

## 2020-09-21 MED ORDER — LIDOCAINE VISCOUS HCL 2 % MT SOLN
15.0000 mL | Freq: Three times a day (TID) | OROMUCOSAL | Status: DC
  Administered 2020-09-21: 15 mL via OROMUCOSAL
  Filled 2020-09-21 (×11): qty 15

## 2020-09-21 NOTE — Progress Notes (Signed)
Occupational Therapy Treatment Patient Details Name: Charles Webster MRN: [id number] DOB: 09/24/1945 Today's Date: 09/21/2020    History of present illness 75 y.o. male with medical history significant for COPD, chronic O2 supplementation, GERD, presents emergency department for chief concerns of shortness of breath.  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   OT comments  Upon entering the room, pt transferring self from [facility] to bed with supervision. Pt was able to perform hygiene independently with increased time. Pt needing time to recover once returning to bed but agreeable to B UE exercises. Pt's O2 saturation remaining above 93% during entire session but he does continue to feel very fatigued and SOB. Use of towel pulled tight pt performs 1 set of 10 forward rows, chest presses, bicep curls, and straight arm raises with increased time and cuing for pursed lip breathing. Pt remains in bed at end of session secondary to fatigue. He remains on 6L via [place] this session.    Follow Up Recommendations  SNF;Supervision - Intermittent    Equipment Recommendations  Other (comment) (defer to next venue of care)       Precautions / Restrictions Precautions Precautions: Fall Precaution Comments: Monitor O2/HR Restrictions Weight Bearing Restrictions: No       Mobility Bed Mobility Overal bed mobility: Needs Assistance Bed Mobility: Sit to Supine       Sit to supine: Supervision                    Balance Overall balance assessment: Needs assistance Sitting-balance support: No upper extremity supported;Feet supported Sitting balance-Leahy Scale: Good                                     ADL either performed or assessed with clinical judgement   ADL Overall ADL's : Needs assistance/impaired                         Toilet Transfer: Supervision/safety;BSC                    Vision Patient Visual Report: No change from baseline            Cognition Arousal/Alertness: Awake/alert Behavior During Therapy: Flat affect;Anxious Overall Cognitive Status: Within Functional Limits for tasks assessed                                 General Comments: Pt is A and O x 4                   Pertinent Vitals/ Pain       Pain Assessment: No/denies pain         Frequency  Min 2X/week        Progress Toward Goals  OT Goals(current goals can now be found in the care plan section)  Progress towards OT goals: Progressing toward goals  Acute Rehab OT Goals Patient Stated Goal: breathe better OT Goal Formulation: With patient/family Time For Goal Achievement: 09/29/20 Potential to Achieve Goals: Good  Plan Discharge plan needs to be updated;Frequency remains appropriate       AM-PAC OT "6 Clicks" Daily Activity     Outcome Measure   Help from another person eating meals?: None Help from  another person taking care of personal grooming?: None Help from another person toileting, which includes using toliet, bedpan, or urinal?: A Little Help from another person bathing (including washing, rinsing, drying)?: A Little Help from another person to put on and taking off regular upper body clothing?: A Little Help from another person to put on and taking off regular lower body clothing?: A Little 6 Click Score: 20    End of Session Equipment Utilized During Treatment: Oxygen (6Ls)  OT Visit Diagnosis: Other abnormalities of gait and mobility (R26.89);Muscle weakness (generalized) (M62.81)   Activity Tolerance Patient tolerated treatment well   Patient Left in bed;with call bell/phone within reach;with bed alarm set   Nurse Communication Mobility status        Time: 4825-0037 OT Time Calculation (min): 23 min  Charges: OT General Charges $OT Visit: 1 Visit OT Treatments $Self Care/Home Management : 8-22  mins $Therapeutic Exercise: 8-22 mins  Jackquline Denmark, MS, OTR/L , CBIS ascom [phone]  09/21/20, 4:56 PM

## 2020-09-21 NOTE — Progress Notes (Addendum)
PROGRESS NOTE    Charles Webster   HLK:[id number]  DOB: [birth year]  PCP: Marina Goodell, MD    DOA: 09/10/2020 LOS: 10    Brief Narrative / Hospital Course to Date:   Per HPI from H&P - "Charles Webster is a 75 y.o. male with medical history significant for COPD, chronic O2 supplementation, GERD, presents emergency department for chief concerns of shortness of breath.  ER he was diagnosed with acute hypoxic respiratory failure due to COPD exacerbation, he was placed on BiPAP and admitted to the hospital."  Assessment & Plan   Principal Problem:   COPD exacerbation (HCC) Active Problems:   Hiatal hernia   Shortness of breath   COVID-19 virus infection   Palliative care by specialist   Acute on chronic respiratory failure with hypoxia due to COPD with acute exacerbation Acute on chronic CHF unspecified Recent COVID-19 infection Treated with IV steroids, Zithromax, IV Lasix. Initially required BiPAP for a few days, has been weaned down to 5 L/min nasal cannula oxygen.   Baseline O2 requirement is 2 to 3 L at home. Follows with pulmonologist Dr. Belia Heman, has terminal/advanced COPD with severe deconditioning at baseline. Palliative care consulted for GOC discussions. Up in chair/OOB daily. Pulmonary hygiene with I-S and flutter. Wean oxygen as tolerated, maintain sats above 88% --Taper steroids --Monitor volume status and give IV Lasix intermittently, as needed --Prognosis long-term is poor --Appreciate palliative care discussions  Recent COVID-19 infection -patient fully vaccinated and was treated with Paxlovid.  On steroids as above.  Main issue with his hypoxia seems to be COPD and CHF.  Acute on chronic diastolic CHF -EF 73%.  Started on [employer]. --Intermittent IV Lasix as needed --Monitor volume status  Right upper extremity wound -present on admission.  Wound care consulted.    GERD -on Paxil  Diarrhea -present on admission, resolved with supportive  care.  Nonsustained V. Tach -asymptomatic, 13 beat run seen on telemetry.  Monitor and replace electrolytes, maintain K>4, andMf>2.  Telemetry.   Hypertension -continue Norvasc.  Now also on Bystolic.  As needed oral hydralazine      Patient BMI: Body mass index is 23.47 kg/m.   DVT prophylaxis: enoxaparin (LOVENOX) injection 40 mg Start: 09/10/20 2200 Place TED hose Start: 09/10/20 2129   Diet:  Diet Orders (From admission, onward)     Start     Ordered   09/10/20 2129  Diet Heart Room service appropriate? Yes; Fluid consistency: Thin  Diet effective now       Question Answer Comment  Room service appropriate? Yes   Fluid consistency: Thin      09/10/20 2130              Code Status: DNR   Subjective 09/21/20    Patient seen this morning at bedside.  Reports overall feeling well.  Complains of mouth pain and only tolerating cold liquids right now and ensures.  Anything solid exacerbates his pain he has no other acute complaints.   Disposition Plan & Communication   Status is: Inpatient  Remains inpatient appropriate because:Inpatient level of care appropriate due to severity of illness, oxygen requirements remain above baseline and patient remains on IV medications as above  Dispo: The patient is from: Home              Anticipated d/c is to: SNF              Patient currently is not medically stable to d/c.  Difficult to place patient No   Consults, Procedures, Significant Events   Consultants:  PCCM Palliative care  Procedures:  CTA -  1. No evidence of pulmonary embolism. 2. 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 3. Marked severity emphysematous lung disease with mild to moderate severity left apical and bilateral lower lobe scarring and/or atelectasis   TTE -  1. Left ventricular ejection fraction, by estimation, is 55 to 60%. The left ventricle has normal function. Left ventricular  endocardial border not optimally defined to evaluate regional wall motion. Left ventricular diastolic parameters are consistent with Grade I diastolic dysfunction (impaired relaxation).  2. Right ventricular systolic function is normal. The right ventricular size is normal. Tricuspid regurgitation signal is inadequate for assessing PA pressure.  3. The mitral valve was not well visualized. No evidence of mitral valve regurgitation. No evidence of mitral stenosis.  4. The aortic valve was not well visualized. Aortic valve regurgitation is not visualized. No aortic stenosis is present.  5. The inferior vena cava is dilated in size with >50% respiratory variability, suggesting right atrial pressure of 8 mmHg.  6. Challenging image quality.  Antimicrobials:  Anti-infectives (From admission, onward)    Start     Dose/Rate Route Frequency Ordered Stop   09/17/20 1000  azithromycin (ZITHROMAX) tablet 250 mg        250 mg Oral Daily 09/16/20 1320 09/17/20 0952   09/15/20 1000  azithromycin (ZITHROMAX) tablet 250 mg        250 mg Oral Daily 09/14/20 1551 09/16/20 1047   09/13/20 1015  azithromycin (ZITHROMAX) tablet 250 mg        250 mg Oral Daily 09/13/20 0929 09/14/20 0952   09/11/20 1800  azithromycin (ZITHROMAX) 500 mg in sodium chloride 0.9 % 250 mL IVPB        500 mg 250 mL/hr over 60 Minutes Intravenous Every 24 hours 09/10/20 2131 09/12/20 1850   09/10/20 1730  cefTRIAXone (ROCEPHIN) 2 g in sodium chloride 0.9 % 100 mL IVPB  Status:  Discontinued        2 g 200 mL/hr over 30 Minutes Intravenous Every 24 hours 09/10/20 1728 09/10/20 2131   09/10/20 1730  azithromycin (ZITHROMAX) 500 mg in sodium chloride 0.9 % 250 mL IVPB  Status:  Discontinued        500 mg 250 mL/hr over 60 Minutes Intravenous Every 24 hours 09/10/20 1728 09/10/20 2131         Micro    Objective   Vitals:   09/20/20 0749 09/20/20 1917 09/21/20 0500 09/21/20 0743  BP: 124/76 124/61 124/62 127/71  Pulse: 78 84 80 81   Resp: 16 16 16 16   Temp: 97.6 F (36.4 C) 97.7 F (36.5 C) 97.8 F (36.6 C) (!) 97.5 F (36.4 C)  TempSrc: Oral Oral  Oral  SpO2: 96% 97% 98% 96%  Weight:      Height:        Intake/Output Summary (Last 24 hours) at 09/21/2020 1528 Last data filed at 09/21/2020 1434 Gross per 24 hour  Intake 480 ml  Output 400 ml  Net 80 ml   Filed Weights   09/10/20 1724 09/11/20 1056  Weight: 65 kg 60.1 kg    Physical Exam:  General exam: awake, alert, no acute distress HEENT: moist mucus membranes, hearing grossly normal  Respiratory system: Severely diminished with poor air movement, no wheezes, rales or rhonchi, normal respiratory effort at rest on 5 L/min  nasal cannula oxygen. Cardiovascular system: normal S1/S2, RRR, no pedal edema.   Gastrointestinal system: soft, NT, ND, no HSM felt, +bowel sounds. Central nervous system: A&O x3. no gross focal neurologic deficits, normal speech Extremities: moves all, no edema, normal tone Skin: dry, intact, normal temperature Psychiatry: normal mood, congruent affect, judgement and insight appear normal  Labs   Data Reviewed: I have personally reviewed following labs and imaging studies  CBC: Recent Labs  Lab 09/15/20 0704 09/16/20 0539 09/17/20 0407 09/20/20 0755 09/21/20 0425  WBC 17.8* 13.9* 16.9* 17.5* 17.2*  NEUTROABS 16.3* 13.1* 15.8* 15.2* 15.3*  HGB 14.0 13.9 14.4 13.5 13.1  HCT 41.8 39.3 42.5 39.9 39.1  MCV 93.7 90.1 90.8 91.9 90.7  PLT 349 357 382 338 331   Basic Metabolic Panel: Recent Labs  Lab 09/16/20 0539 09/17/20 0407 09/18/20 0401 09/20/20 0755 09/21/20 0425  NA 135 134* 136 141 138  K 4.4 5.0 4.4 3.9 3.8  CL 92* 92* 92* 95* 94*  CO2 35* 31 33* 36* 33*  GLUCOSE 131* 128* 113* 109* 117*  BUN 52* 55* 64* 80* 68*  CREATININE 1.12 1.13 1.11 0.98 1.02  CALCIUM 9.4 9.6 9.7 9.9 9.9  MG 2.6*  --   --  2.8* 2.7*   GFR: Estimated Creatinine Clearance: 50.4 mL/min (by C-G formula based on SCr of 1.02  mg/dL). Liver Function Tests: Recent Labs  Lab 09/15/20 0704 09/16/20 0539 09/17/20 0407 09/20/20 0755 09/21/20 0425  AST 29 19 21 17 20   ALT 36 34 37 30 34  ALKPHOS 73 71 80 75 72  BILITOT 0.8 0.9 1.2 1.0 1.0  PROT 6.8 6.6 7.1 6.6 6.1*  ALBUMIN 3.5 3.3* 3.7 3.5 3.2*   No results for input(s): LIPASE, AMYLASE in the last 168 hours. No results for input(s): AMMONIA in the last 168 hours. Coagulation Profile: No results for input(s): INR, PROTIME in the last 168 hours. Cardiac Enzymes: No results for input(s): CKTOTAL, CKMB, CKMBINDEX, TROPONINI in the last 168 hours. BNP (last 3 results) No results for input(s): PROBNP in the last 8760 hours. HbA1C: No results for input(s): HGBA1C in the last 72 hours. CBG: No results for input(s): GLUCAP in the last 168 hours. Lipid Profile: No results for input(s): CHOL, HDL, LDLCALC, TRIG, CHOLHDL, LDLDIRECT in the last 72 hours. Thyroid Function Tests: No results for input(s): TSH, T4TOTAL, FREET4, T3FREE, THYROIDAB in the last 72 hours. Anemia Panel: No results for input(s): VITAMINB12, FOLATE, FERRITIN, TIBC, IRON, RETICCTPCT in the last 72 hours. Sepsis Labs: No results for input(s): PROCALCITON, LATICACIDVEN in the last 168 hours.  No results found for this or any previous visit (from the past 240 hour(s)).    Imaging Studies   No results found.   Medications   Scheduled Meds:  albuterol  2 puff Inhalation Q6H   amLODipine  10 mg Oral Daily   enoxaparin (LOVENOX) injection  40 mg Subcutaneous Q24H   feeding supplement  237 mL Oral BID BM   fluticasone  1 spray Each Nare Daily   ipratropium  2 puff Inhalation Q6H   lidocaine  15 mL Mouth/Throat TID PC & HS   methylPREDNISolone (SOLU-MEDROL) injection  20 mg Intravenous Daily   mometasone-formoterol  2 puff Inhalation BID   multivitamin with minerals  1 tablet Oral Daily   mupirocin ointment   Nasal BID   nebivolol  5 mg Oral Daily   nystatin  5 mL Mouth/Throat QID    pantoprazole  80 mg Oral Daily  PARoxetine  10 mg Oral Daily   Continuous Infusions:     LOS: 10 days    Time spent: 30 minutes    Pennie Banter, DO [facility]  09/21/2020, 3:28 PM      If 7PM-7AM, please contact night-coverage. How to contact the [facility] Attending or Consulting provider 7A - 7P or covering provider during after hours 7P -7A, for this patient?    Check the care team in [facility] and look for a) attending/consulting TRH provider listed and b) the [facility] team listed Log into [email or web address] and use [place]'s universal password to access. If you do not have the password, please contact the hospital operator. Locate the [facility] provider you are looking for under [facility] and page to a number that you can be directly reached. If you still have difficulty reaching the provider, please page the [facility] (Director on Call) for the Hospitalists listed on amion for assistance.

## 2020-09-22 DIAGNOSIS — J441 Chronic obstructive pulmonary disease with (acute) exacerbation: Secondary | ICD-10-CM | POA: Diagnosis not present

## 2020-09-22 LAB — CBC WITH DIFFERENTIAL/PLATELET
Abs Immature Granulocytes: 0.08 10*3/uL — ABNORMAL HIGH (ref 0.00–0.07)
Basophils Absolute: 0 10*3/uL (ref 0.0–0.1)
Basophils Relative: 0 %
Eosinophils Absolute: 0 10*3/uL (ref 0.0–0.5)
Eosinophils Relative: 0 %
HCT: 38.4 % — ABNORMAL LOW (ref 39.0–52.0)
Hemoglobin: 12.8 g/dL — ABNORMAL LOW (ref 13.0–17.0)
Immature Granulocytes: 1 %
Lymphocytes Relative: 3 %
Lymphs Abs: 0.5 10*3/uL — ABNORMAL LOW (ref 0.7–4.0)
MCH: 30.4 pg (ref 26.0–34.0)
MCHC: 33.3 g/dL (ref 30.0–36.0)
MCV: 91.2 fL (ref 80.0–100.0)
Monocytes Absolute: 1.8 10*3/uL — ABNORMAL HIGH (ref 0.1–1.0)
Monocytes Relative: 11 %
Neutro Abs: 13.7 10*3/uL — ABNORMAL HIGH (ref 1.7–7.7)
Neutrophils Relative %: 85 %
Platelets: 330 10*3/uL (ref 150–400)
RBC: 4.21 MIL/uL — ABNORMAL LOW (ref 4.22–5.81)
RDW: 13.3 % (ref 11.5–15.5)
WBC: 16 10*3/uL — ABNORMAL HIGH (ref 4.0–10.5)
nRBC: 0 % (ref 0.0–0.2)

## 2020-09-22 LAB — BRAIN NATRIURETIC PEPTIDE: B Natriuretic Peptide: 87.7 pg/mL (ref 0.0–100.0)

## 2020-09-22 LAB — MAGNESIUM: Magnesium: 2.6 mg/dL — ABNORMAL HIGH (ref 1.7–2.4)

## 2020-09-22 MED ORDER — IPRATROPIUM-ALBUTEROL 0.5-2.5 (3) MG/3ML IN SOLN
3.0000 mL | Freq: Four times a day (QID) | RESPIRATORY_TRACT | Status: DC
  Administered 2020-09-22 – 2020-09-23 (×5): 3 mL via RESPIRATORY_TRACT
  Filled 2020-09-22 (×5): qty 3

## 2020-09-22 MED ORDER — PREDNISONE 20 MG PO TABS
20.0000 mg | ORAL_TABLET | Freq: Every day | ORAL | Status: DC
  Administered 2020-09-22 – 2020-09-23 (×2): 20 mg via ORAL
  Filled 2020-09-22 (×2): qty 1

## 2020-09-22 MED ORDER — BUDESONIDE 0.25 MG/2ML IN SUSP
0.2500 mg | Freq: Two times a day (BID) | RESPIRATORY_TRACT | Status: DC
  Administered 2020-09-22 – 2020-09-23 (×2): 0.25 mg via RESPIRATORY_TRACT
  Filled 2020-09-22 (×2): qty 2

## 2020-09-22 MED ORDER — ALBUTEROL SULFATE (2.5 MG/3ML) 0.083% IN NEBU: 2.5000 mg | INHALATION_SOLUTION | RESPIRATORY_TRACT | Status: DC | PRN

## 2020-09-22 NOTE — Progress Notes (Signed)
Mobility Specialist - Progress Note   09/22/20 1549  Mobility  Activity Ambulated in room;Transferred to/from [facility]  Level of Assistance Modified independent, requires aide device or extra time  Assistive Device Front wheel walker  Distance Ambulated (ft) 10 ft  Mobility Ambulated with assistance in room;Out of bed for toileting  Mobility Response Tolerated well  Mobility performed by Mobility specialist  $Mobility charge 1 Mobility    Pre-mobility: 78 HR, 94% SpO2 During mobility: 96 HR, 83% SpO2 Post-mobility: 73 HR, 95% SpO2   Pt lying in bed upon arrival, utilizing 4.5L. Bumped to 6L for session. Pt stood at EOB, no dizziness. Incremental movements for energy conservation. Ambulated 5' forward and retro with RW prior to sitting on BSC. No LOB. O2 desat to 83% during activity. Pt has bloody BM and voices exhaustion from seated peri-care, mobility offered to assist but pt politely declined. Also reports abdominal pain this date. Pt returned to bed with needs in reach. Back on 4.5L. RN notified.    Filiberto Pinks Mobility Specialist 09/22/20, 4:24 PM

## 2020-09-22 NOTE — Progress Notes (Signed)
Patient had bloody BM.  MD made aware.  Will continue to monitor.

## 2020-09-22 NOTE — TOC Progression Note (Signed)
Transition of Care [facility]) - Progression Note    Patient Details  Name: PRIDE GONZALES MRN: [id number] Date of Birth: October 20, 1945  Transition of Care [facility]) CM/SW Contact  [name], LCSW Phone Number: 09/22/2020, 10:52 AM  Clinical Narrative:   Gave bed offers with CMS scores to patient. He asked CSW to follow up with his children. Called both son and daughter to give offers and those that were still pending. They will discuss tonight or in the morning to determine preference.  Expected Discharge Plan: Home w Home Health Services Barriers to Discharge: Continued Medical Work up  Expected Discharge Plan and Services Expected Discharge Plan: Home w Home Health Services     Post Acute Care Choice: Home Health Living arrangements for the past 2 months: Single Family Home                           HH Arranged: RN, PT, OT, Nurse's Aide HH Agency: [facility] Date HH Agency Contacted: 09/15/20   Representative spoke with at [facility] Agency: [name]   Social Determinants of Health (SDOH) Interventions    Readmission Risk Interventions No flowsheet data found.

## 2020-09-22 NOTE — Care Management Important Message (Signed)
Important Message  Patient Details  Name: Charles Webster MRN: [id number] Date of Birth: 1945-07-25   Medicare Important Message Given:  Yes     Johnell Comings 09/22/2020, 1:12 PM

## 2020-09-22 NOTE — Progress Notes (Signed)
PROGRESS NOTE    Charles Webster   HQI:[id number]  DOB: [birth year]  PCP: Marina Goodell, MD    DOA: 09/10/2020 LOS: 11    Brief Narrative / Hospital Course to Date:   Per HPI from H&P - "Charles Webster is a 75 y.o. male with medical history significant for COPD, chronic O2 supplementation, GERD, presents emergency department for chief concerns of shortness of breath.  ER he was diagnosed with acute hypoxic respiratory failure due to COPD exacerbation, he was placed on BiPAP and admitted to the hospital."  Assessment & Plan   Principal Problem:   COPD exacerbation (HCC) Active Problems:   Hiatal hernia   Shortness of breath   COVID-19 virus infection   Palliative care by specialist   Acute on chronic respiratory failure with hypoxia due to COPD with acute exacerbation Acute on chronic CHF unspecified Recent COVID-19 infection Treated with IV steroids, Zithromax, IV Lasix. Initially required BiPAP for a few days, has been weaned down to 5 L/min nasal cannula oxygen.   Baseline O2 requirement is 2 to 3 L at home. Follows with pulmonologist Dr. Belia Heman, has terminal/advanced COPD with severe deconditioning at baseline. Palliative care consulted for GOC discussions. --Up in chair/OOB daily. --Pulmonary hygiene with I-S and flutter. --Wean oxygen as tolerated, maintain sats above 88% --Taper steroids --Pulmicort nebs twice daily --Scheduled DuoNebs and as needed albuterol neb --Monitor volume status and give IV Lasix intermittently, as needed --Prognosis long-term is poor --Appreciate palliative care discussions  Recent COVID-19 infection -patient fully vaccinated and was treated with Paxlovid.  On steroids as above.  Main issue with his hypoxia seems to be COPD and CHF.  Acute on chronic diastolic CHF -EF 13%.  Started on [employer]. --Intermittent IV Lasix as needed --Monitor volume status  Right upper extremity wound -present on admission.  Wound care consulted.     GERD -on Paxil  Diarrhea -present on admission, resolved with supportive care.  Nonsustained V. Tach -asymptomatic, 13 beat run seen on telemetry.  Monitor and replace electrolytes, maintain K>4, andMf>2.  Telemetry.   Hypertension -continue Norvasc.  Now also on Bystolic.  As needed oral hydralazine    Patient BMI: Body mass index is 23.47 kg/m.   DVT prophylaxis: enoxaparin (LOVENOX) injection 40 mg Start: 09/10/20 2200 Place TED hose Start: 09/10/20 2129   Diet:  Diet Orders (From admission, onward)     Start     Ordered   09/10/20 2129  Diet Heart Room service appropriate? Yes; Fluid consistency: Thin  Diet effective now       Question Answer Comment  Room service appropriate? Yes   Fluid consistency: Thin      09/10/20 2130              Code Status: DNR   Subjective 09/22/20    Patient asking that we resume nebulizer treatments because they work much better for him than inhalers.  Due to his severe shortness of breath is not able to use an inhaler very well.  Only uses nebs at home and says they work much better.  He has no other acute complaints.   Disposition Plan & Communication   Status is: Inpatient  Remains inpatient appropriate because:Inpatient level of care appropriate due to severity of illness, oxygen requirements remain above baseline and patient remains on IV medications as above  Dispo: The patient is from: Home              Anticipated d/c is to:  SNF              Patient currently is not medically stable to d/c.   Difficult to place patient No   Consults, Procedures, Significant Events   Consultants:  PCCM Palliative care  Procedures:  CTA -  1. No evidence of pulmonary embolism. 2. 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 3. Marked severity emphysematous lung disease with mild to moderate severity left apical and bilateral lower lobe scarring and/or atelectasis   TTE -   1. Left ventricular ejection fraction, by estimation, is 55 to 60%. The left ventricle has normal function. Left ventricular endocardial border not optimally defined to evaluate regional wall motion. Left ventricular diastolic parameters are consistent with Grade I diastolic dysfunction (impaired relaxation).  2. Right ventricular systolic function is normal. The right ventricular size is normal. Tricuspid regurgitation signal is inadequate for assessing PA pressure.  3. The mitral valve was not well visualized. No evidence of mitral valve regurgitation. No evidence of mitral stenosis.  4. The aortic valve was not well visualized. Aortic valve regurgitation is not visualized. No aortic stenosis is present.  5. The inferior vena cava is dilated in size with >50% respiratory variability, suggesting right atrial pressure of 8 mmHg.  6. Challenging image quality.  Antimicrobials:  Anti-infectives (From admission, onward)    Start     Dose/Rate Route Frequency Ordered Stop   09/17/20 1000  azithromycin (ZITHROMAX) tablet 250 mg        250 mg Oral Daily 09/16/20 1320 09/17/20 0952   09/15/20 1000  azithromycin (ZITHROMAX) tablet 250 mg        250 mg Oral Daily 09/14/20 1551 09/16/20 1047   09/13/20 1015  azithromycin (ZITHROMAX) tablet 250 mg        250 mg Oral Daily 09/13/20 0929 09/14/20 0952   09/11/20 1800  azithromycin (ZITHROMAX) 500 mg in sodium chloride 0.9 % 250 mL IVPB        500 mg 250 mL/hr over 60 Minutes Intravenous Every 24 hours 09/10/20 2131 09/12/20 1850   09/10/20 1730  cefTRIAXone (ROCEPHIN) 2 g in sodium chloride 0.9 % 100 mL IVPB  Status:  Discontinued        2 g 200 mL/hr over 30 Minutes Intravenous Every 24 hours 09/10/20 1728 09/10/20 2131   09/10/20 1730  azithromycin (ZITHROMAX) 500 mg in sodium chloride 0.9 % 250 mL IVPB  Status:  Discontinued        500 mg 250 mL/hr over 60 Minutes Intravenous Every 24 hours 09/10/20 1728 09/10/20 2131         Micro    Objective    Vitals:   09/22/20 0318 09/22/20 0736 09/22/20 1343 09/22/20 1502  BP: (!) 101/52 106/72  96/61  Pulse: 80 79  77  Resp: 18     Temp: (!) 97.3 F (36.3 C) (!) 97.5 F (36.4 C)  97.7 F (36.5 C)  TempSrc: Oral Oral  Oral  SpO2: 100% 95% 92% 94%  Weight:      Height:        Intake/Output Summary (Last 24 hours) at 09/22/2020 1543 Last data filed at 09/22/2020 1351 Gross per 24 hour  Intake 360 ml  Output 400 ml  Net -40 ml   Filed Weights   09/10/20 1724 09/11/20 1056  Weight: 65 kg 60.1 kg    Physical Exam:  General exam: awake, alert, no acute distress Respiratory system: Severely diminished with poor  air movement, no wheezes, rales or rhonchi, normal respiratory effort at rest on 5 L/min nasal cannula oxygen. Cardiovascular system: normal S1/S2, RRR, no pedal edema.   Gastrointestinal system: soft, NT, ND, no HSM felt, +bowel sounds. Central nervous system: A&O x3. no gross focal neurologic deficits, normal speech Psychiatry: normal mood, congruent affect, judgement and insight appear normal  Labs   Data Reviewed: I have personally reviewed following labs and imaging studies  CBC: Recent Labs  Lab 09/16/20 0539 09/17/20 0407 09/20/20 0755 09/21/20 0425 09/22/20 0643  WBC 13.9* 16.9* 17.5* 17.2* 16.0*  NEUTROABS 13.1* 15.8* 15.2* 15.3* 13.7*  HGB 13.9 14.4 13.5 13.1 12.8*  HCT 39.3 42.5 39.9 39.1 38.4*  MCV 90.1 90.8 91.9 90.7 91.2  PLT 357 382 338 331 330   Basic Metabolic Panel: Recent Labs  Lab 09/16/20 0539 09/17/20 0407 09/18/20 0401 09/20/20 0755 09/21/20 0425 09/22/20 0643  NA 135 134* 136 141 138  --   K 4.4 5.0 4.4 3.9 3.8  --   CL 92* 92* 92* 95* 94*  --   CO2 35* 31 33* 36* 33*  --   GLUCOSE 131* 128* 113* 109* 117*  --   BUN 52* 55* 64* 80* 68*  --   CREATININE 1.12 1.13 1.11 0.98 1.02  --   CALCIUM 9.4 9.6 9.7 9.9 9.9  --   MG 2.6*  --   --  2.8* 2.7* 2.6*   GFR: Estimated Creatinine Clearance: 50.4 mL/min (by C-G formula based  on SCr of 1.02 mg/dL). Liver Function Tests: Recent Labs  Lab 09/16/20 0539 09/17/20 0407 09/20/20 0755 09/21/20 0425  AST 19 21 17 20   ALT 34 37 30 34  ALKPHOS 71 80 75 72  BILITOT 0.9 1.2 1.0 1.0  PROT 6.6 7.1 6.6 6.1*  ALBUMIN 3.3* 3.7 3.5 3.2*   No results for input(s): LIPASE, AMYLASE in the last 168 hours. No results for input(s): AMMONIA in the last 168 hours. Coagulation Profile: No results for input(s): INR, PROTIME in the last 168 hours. Cardiac Enzymes: No results for input(s): CKTOTAL, CKMB, CKMBINDEX, TROPONINI in the last 168 hours. BNP (last 3 results) No results for input(s): PROBNP in the last 8760 hours. HbA1C: No results for input(s): HGBA1C in the last 72 hours. CBG: No results for input(s): GLUCAP in the last 168 hours. Lipid Profile: No results for input(s): CHOL, HDL, LDLCALC, TRIG, CHOLHDL, LDLDIRECT in the last 72 hours. Thyroid Function Tests: No results for input(s): TSH, T4TOTAL, FREET4, T3FREE, THYROIDAB in the last 72 hours. Anemia Panel: No results for input(s): VITAMINB12, FOLATE, FERRITIN, TIBC, IRON, RETICCTPCT in the last 72 hours. Sepsis Labs: No results for input(s): PROCALCITON, LATICACIDVEN in the last 168 hours.  No results found for this or any previous visit (from the past 240 hour(s)).    Imaging Studies   No results found.   Medications   Scheduled Meds:  amLODipine  10 mg Oral Daily   budesonide (PULMICORT) nebulizer solution  0.25 mg Nebulization BID   enoxaparin (LOVENOX) injection  40 mg Subcutaneous Q24H   feeding supplement  237 mL Oral BID BM   fluticasone  1 spray Each Nare Daily   ipratropium-albuterol  3 mL Nebulization Q6H   lidocaine  15 mL Mouth/Throat TID PC & HS   multivitamin with minerals  1 tablet Oral Daily   mupirocin ointment   Nasal BID   nebivolol  5 mg Oral Daily   nystatin  5 mL Mouth/Throat QID  pantoprazole  80 mg Oral Daily   PARoxetine  10 mg Oral Daily   predniSONE  20 mg Oral Q  breakfast   Continuous Infusions:     LOS: 11 days    Time spent: 30 minutes with > 50% spent at bedside and in coordination of care     Pennie Banter, DO [facility]  09/22/2020, 3:43 PM      If 7PM-7AM, please contact night-coverage. How to contact the [facility] Attending or Consulting provider 7A - 7P or covering provider during after hours 7P -7A, for this patient?    Check the care team in [facility] and look for a) attending/consulting TRH provider listed and b) the [facility] team listed Log into [email or web address] and use [place]'s universal password to access. If you do not have the password, please contact the hospital operator. Locate the [facility] provider you are looking for under [facility] and page to a number that you can be directly reached. If you still have difficulty reaching the provider, please page the [facility] (Director on Call) for the Hospitalists listed on amion for assistance.

## 2020-09-23 DIAGNOSIS — H04123 Dry eye syndrome of bilateral lacrimal glands: Secondary | ICD-10-CM | POA: Diagnosis not present

## 2020-09-23 DIAGNOSIS — K5900 Constipation, unspecified: Secondary | ICD-10-CM | POA: Diagnosis not present

## 2020-09-23 DIAGNOSIS — R42 Dizziness and giddiness: Secondary | ICD-10-CM | POA: Diagnosis not present

## 2020-09-23 DIAGNOSIS — U071 COVID-19: Secondary | ICD-10-CM | POA: Diagnosis not present

## 2020-09-23 DIAGNOSIS — Z8616 Personal history of COVID-19: Secondary | ICD-10-CM | POA: Diagnosis not present

## 2020-09-23 DIAGNOSIS — S41101S Unspecified open wound of right upper arm, sequela: Secondary | ICD-10-CM | POA: Diagnosis not present

## 2020-09-23 DIAGNOSIS — J189 Pneumonia, unspecified organism: Secondary | ICD-10-CM | POA: Diagnosis not present

## 2020-09-23 DIAGNOSIS — I10 Essential (primary) hypertension: Secondary | ICD-10-CM | POA: Diagnosis not present

## 2020-09-23 DIAGNOSIS — I130 Hypertensive heart and chronic kidney disease with heart failure and stage 1 through stage 4 chronic kidney disease, or unspecified chronic kidney disease: Secondary | ICD-10-CM | POA: Diagnosis not present

## 2020-09-23 DIAGNOSIS — J9620 Acute and chronic respiratory failure, unspecified whether with hypoxia or hypercapnia: Secondary | ICD-10-CM | POA: Diagnosis not present

## 2020-09-23 DIAGNOSIS — I959 Hypotension, unspecified: Secondary | ICD-10-CM | POA: Diagnosis not present

## 2020-09-23 DIAGNOSIS — M6281 Muscle weakness (generalized): Secondary | ICD-10-CM | POA: Diagnosis not present

## 2020-09-23 DIAGNOSIS — R5381 Other malaise: Secondary | ICD-10-CM | POA: Diagnosis not present

## 2020-09-23 DIAGNOSIS — K219 Gastro-esophageal reflux disease without esophagitis: Secondary | ICD-10-CM | POA: Diagnosis not present

## 2020-09-23 DIAGNOSIS — F419 Anxiety disorder, unspecified: Secondary | ICD-10-CM | POA: Diagnosis not present

## 2020-09-23 DIAGNOSIS — Z7401 Bed confinement status: Secondary | ICD-10-CM | POA: Diagnosis not present

## 2020-09-23 DIAGNOSIS — J449 Chronic obstructive pulmonary disease, unspecified: Secondary | ICD-10-CM | POA: Diagnosis not present

## 2020-09-23 DIAGNOSIS — D649 Anemia, unspecified: Secondary | ICD-10-CM | POA: Diagnosis not present

## 2020-09-23 DIAGNOSIS — I5032 Chronic diastolic (congestive) heart failure: Secondary | ICD-10-CM | POA: Diagnosis not present

## 2020-09-23 DIAGNOSIS — J441 Chronic obstructive pulmonary disease with (acute) exacerbation: Secondary | ICD-10-CM | POA: Diagnosis not present

## 2020-09-23 DIAGNOSIS — R262 Difficulty in walking, not elsewhere classified: Secondary | ICD-10-CM | POA: Diagnosis not present

## 2020-09-23 DIAGNOSIS — E569 Vitamin deficiency, unspecified: Secondary | ICD-10-CM | POA: Diagnosis not present

## 2020-09-23 DIAGNOSIS — J309 Allergic rhinitis, unspecified: Secondary | ICD-10-CM | POA: Diagnosis not present

## 2020-09-23 DIAGNOSIS — F32A Depression, unspecified: Secondary | ICD-10-CM | POA: Diagnosis not present

## 2020-09-23 LAB — CBC WITH DIFFERENTIAL/PLATELET
Abs Immature Granulocytes: 0.13 10*3/uL — ABNORMAL HIGH (ref 0.00–0.07)
Basophils Absolute: 0 10*3/uL (ref 0.0–0.1)
Basophils Relative: 0 %
Eosinophils Absolute: 0 10*3/uL (ref 0.0–0.5)
Eosinophils Relative: 0 %
HCT: 38.1 % — ABNORMAL LOW (ref 39.0–52.0)
Hemoglobin: 12.6 g/dL — ABNORMAL LOW (ref 13.0–17.0)
Immature Granulocytes: 1 %
Lymphocytes Relative: 4 %
Lymphs Abs: 0.6 10*3/uL — ABNORMAL LOW (ref 0.7–4.0)
MCH: 30.4 pg (ref 26.0–34.0)
MCHC: 33.1 g/dL (ref 30.0–36.0)
MCV: 92 fL (ref 80.0–100.0)
Monocytes Absolute: 1.6 10*3/uL — ABNORMAL HIGH (ref 0.1–1.0)
Monocytes Relative: 11 %
Neutro Abs: 13.2 10*3/uL — ABNORMAL HIGH (ref 1.7–7.7)
Neutrophils Relative %: 84 %
Platelets: 315 10*3/uL (ref 150–400)
RBC: 4.14 MIL/uL — ABNORMAL LOW (ref 4.22–5.81)
RDW: 13.3 % (ref 11.5–15.5)
WBC: 15.6 10*3/uL — ABNORMAL HIGH (ref 4.0–10.5)
nRBC: 0 % (ref 0.0–0.2)

## 2020-09-23 LAB — BRAIN NATRIURETIC PEPTIDE: B Natriuretic Peptide: 95.8 pg/mL (ref 0.0–100.0)

## 2020-09-23 LAB — MAGNESIUM: Magnesium: 2.5 mg/dL — ABNORMAL HIGH (ref 1.7–2.4)

## 2020-09-23 MED ORDER — ALBUTEROL SULFATE HFA 108 (90 BASE) MCG/ACT IN AERS
2.0000 | INHALATION_SPRAY | RESPIRATORY_TRACT | 3 refills | Status: AC | PRN
Start: 2020-09-23 — End: ?

## 2020-09-23 MED ORDER — PAROXETINE HCL 10 MG PO TABS: 10.0000 mg | ORAL_TABLET | Freq: Every day | ORAL | Status: DC

## 2020-09-23 MED ORDER — NYSTATIN 100000 UNIT/ML MT SUSP: 5.0000 mL | Freq: Four times a day (QID) | OROMUCOSAL | 0 refills | Status: DC

## 2020-09-23 MED ORDER — NEBIVOLOL HCL 5 MG PO TABS: 5.0000 mg | ORAL_TABLET | Freq: Every day | ORAL | Status: DC

## 2020-09-23 MED ORDER — ADULT MULTIVITAMIN W/MINERALS CH: 1.0000 | ORAL_TABLET | Freq: Every day | ORAL | Status: DC

## 2020-09-23 MED ORDER — FLUTICASONE PROPIONATE 50 MCG/ACT NA SUSP: 1.0000 | Freq: Every day | NASAL | 2 refills | Status: DC

## 2020-09-23 MED ORDER — PREDNISONE 10 MG PO TABS: 10.0000 mg | ORAL_TABLET | Freq: Every day | ORAL | 0 refills | Status: AC

## 2020-09-23 MED ORDER — IPRATROPIUM-ALBUTEROL 0.5-2.5 (3) MG/3ML IN SOLN: 3.0000 mL | Freq: Three times a day (TID) | RESPIRATORY_TRACT | 5 refills | Status: DC

## 2020-09-23 MED ORDER — ENSURE ENLIVE PO LIQD: 237.0000 mL | Freq: Two times a day (BID) | ORAL | 12 refills | Status: AC

## 2020-09-23 MED ORDER — LIDOCAINE VISCOUS HCL 2 % MT SOLN: 15.0000 mL | Freq: Three times a day (TID) | OROMUCOSAL | 0 refills | Status: DC

## 2020-09-23 NOTE — Progress Notes (Signed)
[facility] Room [address] [facility]) Hospital Liaison RN note  Notified by Charlynn Court, SW with [facility] of patient/family request for [facility] Palliative services at discharge.  Discharge noted to be today to [facility].  Please call with any outpatient palliative questions or concerns.  Thank you for the opportunity to participate in this patient's care.  Haynes Bast, BSN, RN [facility] Liaison [phone]

## 2020-09-23 NOTE — TOC Transition Note (Signed)
Transition of Care [facility]) - CM/SW Discharge Note   Patient Details  Name: ROCIO WOLAK MRN: [id number] Date of Birth: Jun 23, 1945  Transition of Care [facility]) CM/SW Contact:  [name], LCSW Phone Number: 09/23/2020, 2:24 PM   Clinical Narrative:   Patient has orders to discharge to [facility] today. RN will call report to [phone] (Room 703). EMS transport has been arranged. They said there are several in front of him on the list. Daughter confirmed interest in outpatient palliative services. Referral made to Haynes Bast, RN with [facility]. No further concerns. CSW signing off.    Final next level of care: Skilled Nursing Facility Barriers to Discharge: Barriers Resolved   Patient Goals and CMS Choice     Choice offered to / list presented to : Patient, Adult Children  Discharge Placement PASRR number recieved: 09/20/20            Patient chooses bed at: [facility] [place] Patient to be transferred to facility by: EMS Name of family member notified: Charla Maness Patient and family notified of of transfer: 09/23/20  Discharge Plan and Services     Post Acute Care Choice: Home Health                    HH Arranged: RN, PT, OT, Nurse's Aide HH Agency: [facility] Date [facility] Agency Contacted: 09/15/20   Representative spoke with at [facility] Agency: [name]  Social Determinants of Health (SDOH) Interventions     Readmission Risk Interventions No flowsheet data found.

## 2020-09-23 NOTE — Discharge Summary (Addendum)
Physician Discharge Summary  [name] TML:[id number] DOB: October 11, 1945 DOA: 09/10/2020  PCP: Charles Goodell, MD  Admit date: 09/10/2020 Discharge date: 09/23/2020  Admitted From: home Disposition:  SNF  Recommendations for Outpatient Follow-up:  Follow up with PCP in 1-2 weeks Please obtain BMP/CBC in one week Palliative Care to follow outpatient at SNF Follow up with Pulmonology   Home Health: No  Equipment/Devices: None   Discharge Condition: Stable CODE STATUS: DNR  Diet recommendation: Heart Healthy     Discharge Diagnoses: Principal Problem:   COPD exacerbation (HCC) Active Problems:   Hiatal hernia   Shortness of breath   COVID-19 virus infection   Palliative care by specialist  PRIMARY PROBLEM - COPD EXACERBATION    Summary of HPI and Hospital Course:   Per HPI from H&P - "Charles Webster is a 75 y.o. male with medical history significant for COPD, chronic O2 supplementation, GERD, presents emergency department for chief concerns of shortness of breath.  ER he was diagnosed with acute hypoxic respiratory failure due to COPD exacerbation, he was placed on BiPAP and admitted to the hospital."   Acute on chronic respiratory failure with hypoxia due to COPD with acute exacerbation With concurrent  Acute on chronic CHF unspecified and  Recent COVID-19 infection no longer active, not treated for covid this admission.  Treated with IV steroids, Zithromax, IV Lasix. Initially required BiPAP for a few days, has been weaned down to 4-5 L/min nasal cannula O2.   Prior baseline O2 requirement is 2 to 3 L at home. Follows with pulmonologist Dr. Belia Webster, has terminal/advanced COPD with severe deconditioning at baseline. Palliative care consulted for GOC discussions and to follow in outpatient setting. --Up in chair daily, physical activity as tolerated --Pulmonary hygiene with I-S and flutter. --Supplement oxygen, maintain sats above 88% --Taper steroids - discharge  with 3 more days prednisone 10mg  --Pulmicort nebs twice daily --Scheduled DuoNebs and as needed albuterol nebs --Prognosis long-term is poor --Appreciate continued palliative care discussions   Recent COVID-19 infection -patient fully vaccinated and was treated with Paxlovid.   On steroids as above.   Main issue with his hypoxia seems to be COPD and CHF.   Acute on chronic diastolic CHF -EF .   Started on 68%. --Intermittent IV Lasix as needed --Monitor volume status   Right upper extremity wound -present on admission.  Wound care consulted.     GERD -on PPI  Depression/Anxiety - started on Paxil and seems tolerating well.    Diarrhea -present on admission, resolved with supportive care.   Nonsustained V. Tach -asymptomatic, 13 beat run seen on telemetry.  Monitor and replace electrolytes, maintain K>4, andMf>2.  Telemetry.     Hypertension -started on Norvasc and also Bystolic for CHF.   BP's on soft side now on lower dose steroids.  Will stop the Norvasc at discharge.   Continue Bystolic.   Follow up on BP control closely.          Discharge Instructions   Discharge Instructions     (HEART FAILURE PATIENTS) Call MD:  Anytime you have any of the following symptoms: 1) 3 pound weight gain in 24 hours or 5 pounds in 1 week 2) shortness of breath, with or without a dry hacking cough 3) swelling in the hands, feet or stomach 4) if you have to sleep on extra pillows at night in order to breathe.   Complete by: As directed    Call MD for:   Complete by:  As directed    Worsening shortness of breath or needing increasingly more oxygen to keep O2 level above 88%   Call MD for:  extreme fatigue   Complete by: As directed    Call MD for:  persistant dizziness or light-headedness   Complete by: As directed    Call MD for:  persistant nausea and vomiting   Complete by: As directed    Call MD for:  severe uncontrolled pain   Complete by: As directed    Call MD for:   temperature >100.4   Complete by: As directed    Diet - low sodium heart healthy   Complete by: As directed    Discharge instructions   Complete by: As directed    Continue Prednisone for just 3 more days.  We did most of the steroid taper here in the hospital.  Follow up with your PCP and Pulmonologist closely, preferably within a couple of weeks.   Discharge wound care:   Complete by: As directed    Cleanse Right forearm skin tear daily with normal saline, pat dry.  Cover with folded piece of xeroform gauze, top with dry gause 4x4's and secure with Kerlix or paper tape.   Increase activity slowly   Complete by: As directed       Allergies as of 09/23/2020       Reactions   Alpha Blocker Quinazolines    Clindamycin/lincomycin Nausea Only   Doxycycline Other (See Comments)   Levaquin [levofloxacin]    Penicillins    Singulair [montelukast Sodium] Other (See Comments)   Flu like symptoms   Sulfa Antibiotics Other (See Comments)   Tamsulosin Other (See Comments)        Medication List     STOP taking these medications    Lagevrio 200 MG Caps Generic drug: Molnupiravir       TAKE these medications    albuterol 108 (90 Base) MCG/ACT inhaler Commonly known as: VENTOLIN HFA Inhale 2 puffs into the lungs every 4 (four) hours as needed for wheezing or shortness of breath. What changed: when to take this   budesonide 0.5 MG/2ML nebulizer solution Commonly known as: PULMICORT INHALE 1 VIAL TWICE A DAY   feeding supplement Liqd Take 237 mLs by mouth 2 (two) times daily between meals.   fluticasone 50 MCG/ACT nasal spray Commonly known as: FLONASE Place 1 spray into both nostrils daily. Start taking on: September 24, 2020   ipratropium-albuterol 0.5-2.5 (3) MG/3ML Soln Commonly known as: DUONEB Take 3 mLs by nebulization every 8 (eight) hours. Dx:J44.9 What changed:  when to take this reasons to take this   lidocaine 2 % solution Commonly known as:  XYLOCAINE Use as directed 15 mLs in the mouth or throat 4 (four) times daily - after meals and at bedtime. As needed for mouth pain   multivitamin with minerals Tabs tablet Take 1 tablet by mouth daily. Start taking on: September 24, 2020   nebivolol 5 MG tablet Commonly known as: BYSTOLIC Take 1 tablet (5 mg total) by mouth daily. Start taking on: September 24, 2020   nystatin 100000 UNIT/ML suspension Commonly known as: MYCOSTATIN Use as directed 5 mLs (500,000 Units total) in the mouth or throat 4 (four) times daily.   omeprazole 40 MG capsule Commonly known as: PRILOSEC Take 40 mg by mouth daily.   PARoxetine 10 MG tablet Commonly known as: PAXIL Take 1 tablet (10 mg total) by mouth daily. Start taking on: September 24, 2020  predniSONE 10 MG tablet Commonly known as: DELTASONE Take 1 tablet (10 mg total) by mouth daily for 3 days. What changed:  medication strength how much to take when to take this   Restasis 0.05 % ophthalmic emulsion Generic drug: cycloSPORINE               Discharge Care Instructions  (From admission, onward)           Start     Ordered   09/23/20 0000  Discharge wound care:       Comments: Cleanse Right forearm skin tear daily with normal saline, pat dry.  Cover with folded piece of xeroform gauze, top with dry gause 4x4's and secure with Kerlix or paper tape.   09/23/20 1302            Follow-up Information     Erin Fulling, MD. Schedule an appointment as soon as possible for a visit in 2 week(s).   Specialties: Pulmonary Disease, Cardiology Why: Hospital follow up for advanced COPD Contact information: [address] [place] [address] [phone]                Allergies  Allergen Reactions   Alpha Blocker Quinazolines    Clindamycin/Lincomycin Nausea Only   Doxycycline Other (See Comments)   Levaquin [Levofloxacin]    Penicillins    Singulair [Montelukast Sodium] Other (See Comments)    Flu  like symptoms   Sulfa Antibiotics Other (See Comments)   Tamsulosin Other (See Comments)     If you experience worsening of your admission symptoms, develop shortness of breath, life threatening emergency, suicidal or homicidal thoughts you must seek medical attention immediately by calling 911 or calling your MD immediately  if symptoms less severe.    Please note   You were cared for by a hospitalist during your hospital stay. If you have any questions about your discharge medications or the care you received while you were in the hospital after you are discharged, you can call the unit and asked to speak with the hospitalist on call if the hospitalist that took care of you is not available. Once you are discharged, your primary care physician will handle any further medical issues. Please note that NO REFILLS for any discharge medications will be authorized once you are discharged, as it is imperative that you return to your primary care physician (or establish a relationship with a primary care physician if you do not have one) for your aftercare needs so that they can reassess your need for medications and monitor your lab values.   Consultations: PCCM Palliative Care    Procedures/Studies: CT Angio Chest Pulmonary Embolism (PE) W or WO Contrast  Result Date: 09/12/2020 CLINICAL DATA:  Suspected pulmonary embolism. EXAM: CT ANGIOGRAPHY CHEST WITH CONTRAST TECHNIQUE: Multidetector CT imaging of the chest was performed using the standard protocol during bolus administration of intravenous contrast. Multiplanar CT image reconstructions and MIPs were obtained to evaluate the vascular anatomy. CONTRAST:  75mL OMNIPAQUE IOHEXOL 350 MG/ML SOLN COMPARISON:  None. FINDINGS: Cardiovascular: There is mild to moderate severity calcification of the aortic arch. Satisfactory opacification of the pulmonary arteries to the segmental level. No evidence of pulmonary embolism. Normal heart size with moderate  severity coronary artery calcification. No pericardial effusion. Mediastinum/Nodes: Small, partially calcified pretracheal and bilateral hilar nodes are seen. Thyroid gland, trachea, and esophagus demonstrate no significant findings. Lungs/Pleura: There is marked severity emphysematous lung disease. Postoperative changes are seen on the right,  consistent with the patient's history of partial right lobectomy. Subsequent right-sided volume loss is seen with left to right shift of the superior mediastinal structures. A 3.4 cm x 2.5 cm x 4.1 cm area of fluid attenuation, with a thin partially calcified surrounding wall, is seen within the posterior aspect of the upper right lung (approximately 21 Hounsfield units). This is also likely postoperative in origin. Mild to moderate severity areas of scarring and atelectasis are seen along the left apex and posterior aspect of the bilateral lower lobes. Similar appearing changes are seen within the anterior aspect of the upper right lung. No pneumothorax is identified. Upper Abdomen: No acute abnormality. Musculoskeletal: Chronic and postoperative deformities are seen involving multiple right-sided ribs. Chronic fourth, fifth and sixth left rib fractures are seen. Review of the MIP images confirms the above findings. IMPRESSION: 1. No evidence of pulmonary embolism. 2. 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 3. Marked severity emphysematous lung disease with mild to moderate severity left apical and bilateral lower lobe scarring and/or atelectasis. Electronically Signed   By: Aram Candela M.D.   On: 09/12/2020 01:17   DG Chest Port 1 View  Result Date: 09/19/2020 CLINICAL DATA:  75 year old male with a history of shortness of breath EXAM: PORTABLE CHEST 1 VIEW COMPARISON:  09/17/2020 FINDINGS: Cardiomediastinal silhouette unchanged in size and contour. No evidence of central vascular congestion. No  interlobular septal thickening. Architectural distortion again demonstrated with reticular pattern of opacities at the bilateral lung bases, postsurgical changes in the right hilar/suprahilar region, and pleuroparenchymal thickening at the right apex. Emphysematous changes on the anterior view. No pneumothorax. Coarsened interstitial markings, with no new confluent airspace disease. No acute displaced fracture. Partially healed left-sided rib fractures IMPRESSION: Similar appearance of the chest x-ray, with chronic changes/scarring, postoperative changes, and emphysema. Electronically Signed   By: Gilmer Mor D.O.   On: 09/19/2020 08:47   DG Chest Port 1 View  Result Date: 09/17/2020 CLINICAL DATA:  Shortness of breath and COVID infection for 1 week. COPD EXAM: PORTABLE CHEST 1 VIEW COMPARISON:  09/14/2020 FINDINGS: Tracheal deviation right. Normal heart size. Trace right pleural fluid or thickening, with similar blunting of the costophrenic angle. Extensive surgical changes in the superior right hemithorax, with resultant volume loss. Underlying advanced emphysema. No pneumothorax. Bibasilar pulmonary opacities are favored to represent areas of scarring and not significantly changed. No new pulmonary opacity. Remote left rib fractures. IMPRESSION: Relatively similar appearance of the chest since 09/14/2020. Surgical changes and volume loss in the right hemithorax with similar pleural fluid or thickening and bibasilar scarring. Advanced emphysema. Electronically Signed   By: Jeronimo Greaves M.D.   On: 09/17/2020 08:11   DG Chest Port 1 View  Result Date: 09/14/2020 CLINICAL DATA:  Shortness of breath; history COPD, bronchitis, GERD, hiatal hernia, former smoker EXAM: PORTABLE CHEST 1 VIEW COMPARISON:  Portable exam 0752 hours compared to 09/12/2020 radiographic and CT angio chest exams FINDINGS: Normal heart size and pulmonary vascularity. Volume loss in RIGHT hemithorax with mediastinal shift to the RIGHT.  Atherosclerotic calcification aorta. Postsurgical changes and marked volume loss in RIGHT upper lobe with persistent pleuroparenchymal thickening at apex. Underlying emphysematous and chronic bronchitic changes. Bibasilar interstitial infiltrates versus chronic interstitial lung disease. Calcified granulomata LEFT upper lobe. Remaining lungs clear. Trace RIGHT pleural effusion. No pneumothorax. Bones demineralized with old fractures of the anterior LEFT fourth fifth and sixth ribs. IMPRESSION: Postoperative changes of the upper RIGHT hemithorax with volume loss  in RIGHT chest. COPD changes with RIGHT upper lobe scarring. Bibasilar shall infiltrates versus chronic interstitial lung disease unchanged. Aortic Atherosclerosis (ICD10-I70.0) and Emphysema (ICD10-J43.9). Electronically Signed   By: Ulyses SouthwardMark  Boles M.D.   On: 09/14/2020 08:31   DG Chest Port 1 View  Result Date: 09/12/2020 CLINICAL DATA:  Respiratory distress. EXAM: PORTABLE CHEST 1 VIEW COMPARISON:  CTA chest from same day.  Chest x-ray from yesterday. FINDINGS: Normal heart size. Similar postsurgical changes, scarring, and volume loss in the right lung apex with rightward tracheal shift. Chronically coarsened interstitial markings again noted with lung hyperinflation and emphysematous changes. Similar reticulonodular densities at both lung bases. Similar scarring at the right costophrenic angle. No acute osseous abnormality. IMPRESSION: 1. Similar mild reticulonodular densities at both lung bases which may be infectious or inflammatory. 2. COPD with stable postsurgical changes, scarring, and volume loss in the right lung apex. Electronically Signed   By: Obie DredgeWilliam T Derry M.D.   On: 09/12/2020 07:41   DG Chest Port 1 View  Result Date: 09/11/2020 CLINICAL DATA:  Respiratory distress EXAM: PORTABLE CHEST 1 VIEW COMPARISON:  None. FINDINGS: Normal mediastinum and cardiac silhouette. There is volume loss in the RIGHT hemithorax related to prior surgery.  There is mild airspace disease at the lung bases. No pneumothorax. No pleural fluid. IMPRESSION: Bibasilar mild airspace disease suggests pulmonary edema. Postsurgical volume loss in the RIGHT hemithorax Electronically Signed   By: Genevive BiStewart  Edmunds M.D.   On: 09/11/2020 07:33   DG Chest Port 1 View  Result Date: 09/10/2020 CLINICAL DATA:  Possible sepsis, history of COVID-19 positivity EXAM: PORTABLE CHEST 1 VIEW COMPARISON:  11/21/2017 FINDINGS: Cardiac shadow is within normal limits. The lungs are hyperinflated. Persistent scarring and volume loss is noted in the right upper lobe related to prior surgery and stable. Will rib fractures are noted on the right with healing. Mild atelectasis versus scarring is noted in the left base. No confluent focal infiltrate is seen. Aortic calcifications are noted. IMPRESSION: Chronic appearing changes. COPD without acute abnormality. Electronically Signed   By: Alcide CleverMark  Lukens M.D.   On: 09/10/2020 17:55   ECHOCARDIOGRAM COMPLETE  Result Date: 09/12/2020    ECHOCARDIOGRAM REPORT   Patient Name:   Loma MessingJERRY W Webster Date of Exam: 09/11/2020 Medical Rec #:  [id number]     Height:       63.0 in Accession #:    [id number]    Weight:       132.5 lb Date of Birth:  12/03/1945    BSA:          1.623 m Patient Age:    74 years      BP:           170/94 mmHg Patient Gender: M             HR:           96 bpm. Exam Location:  [facility] Procedure: 2D Echo and Intracardiac Opacification Agent Indications:     CHF I50.31  History:         Patient has no prior history of Echocardiogram examinations.  Sonographer:     Overton Mamikeshia Johnson RDCS Referring Phys:  Effie [id number] [address] ScotlandRASHANT K [facility] LLCINGH Diagnosing Phys: Lorine BearsMuhammad Arida MD  Sonographer Comments: Technically challenging study due to limited acoustic windows, suboptimal parasternal window, suboptimal apical window and suboptimal subcostal window. IMPRESSIONS  1. Left ventricular ejection fraction, by estimation, is 55 to 60%. The left ventricle has normal  function. Left ventricular endocardial border not  optimally defined to evaluate regional wall motion. Left ventricular diastolic parameters are consistent with Grade I diastolic dysfunction (impaired relaxation).  2. Right ventricular systolic function is normal. The right ventricular size is normal. Tricuspid regurgitation signal is inadequate for assessing PA pressure.  3. The mitral valve was not well visualized. No evidence of mitral valve regurgitation. No evidence of mitral stenosis.  4. The aortic valve was not well visualized. Aortic valve regurgitation is not visualized. No aortic stenosis is present.  5. The inferior vena cava is dilated in size with >50% respiratory variability, suggesting right atrial pressure of 8 mmHg.  6. Challenging image quality. FINDINGS  Left Ventricle: Left ventricular ejection fraction, by estimation, is 55 to 60%. The left ventricle has normal function. Left ventricular endocardial border not optimally defined to evaluate regional wall motion. Definity contrast agent was given IV to delineate the left ventricular endocardial borders. The left ventricular internal cavity size was normal in size. There is no left ventricular hypertrophy. Left ventricular diastolic parameters are consistent with Grade I diastolic dysfunction (impaired relaxation). Right Ventricle: The right ventricular size is normal. No increase in right ventricular wall thickness. Right ventricular systolic function is normal. Tricuspid regurgitation signal is inadequate for assessing PA pressure. Left Atrium: Left atrial size was normal in size. Right Atrium: Right atrial size was normal in size. Pericardium: There is no evidence of pericardial effusion. Mitral Valve: The mitral valve was not well visualized. No evidence of mitral valve regurgitation. No evidence of mitral valve stenosis. Tricuspid Valve: The tricuspid valve is not well visualized. Tricuspid valve regurgitation is not demonstrated. No evidence  of tricuspid stenosis. Aortic Valve: The aortic valve was not well visualized. Aortic valve regurgitation is not visualized. No aortic stenosis is present. Aortic valve peak gradient measures 2.6 mmHg. Pulmonic Valve: The pulmonic valve was not well visualized. Pulmonic valve regurgitation is not visualized. No evidence of pulmonic stenosis. Aorta: The aortic root was not well visualized. Venous: The inferior vena cava is dilated in size with greater than 50% respiratory variability, suggesting right atrial pressure of 8 mmHg. IAS/Shunts: No atrial level shunt detected by color flow Doppler.  LEFT VENTRICLE PLAX 2D LVIDd:         3.86 cm Diastology LVIDs:         2.67 cm LV e' lateral:   7.72 cm/s LV PW:         1.04 cm LV E/e' lateral: 6.1 LV IVS:        0.88 cm  LEFT ATRIUM           Index      RIGHT ATRIUM          Index LA Vol (A4C): 12.9 ml 7.95 ml/m RA Area:     7.62 cm                                  RA Volume:   15.90 ml 9.80 ml/m  AORTIC VALVE AV Vmax:      80.10 cm/s AV Peak Grad: 2.6 mmHg LVOT Vmax:    73.80 cm/s LVOT Vmean:   49.700 cm/s LVOT VTI:     0.104 m  AORTA Ao Root diam: 3.20 cm MITRAL VALVE MV Area (PHT): 2.36 cm    SHUNTS MV Decel Time: 322 msec    Systemic VTI: 0.10 m MV E velocity: 47.10 cm/s MV A velocity: 84.00 cm/s MV E/A ratio:  0.56  Lorine Bears MD Electronically signed by Lorine Bears MD Signature Date/Time: 09/12/2020/7:49:28 AM    Final       Subjective: pt doing well this AM. Reports feeling weak in general.  Breathing stable, baseline shortness of breath.  He says switching back to nebs helping.  No acute complaints.    Discharge Exam: Vitals:   09/23/20 0722 09/23/20 0819  BP:  104/64  Pulse:  69  Resp:  17  Temp:  (!) 97.5 F (36.4 C)  SpO2: 94% 96%   Vitals:   09/23/20 0116 09/23/20 0431 09/23/20 0722 09/23/20 0819  BP:  98/65  104/64  Pulse:  76  69  Resp:  18  17  Temp:  98.4 F (36.9 C)  (!) 97.5 F (36.4 C)  TempSrc:  Oral    SpO2: 94% 98%  94% 96%  Weight:      Height:        General: Pt is alert, awake, not in acute distress, frail, underweight Cardiovascular: RRR, S1/S2 +, no rubs, no gallops Respiratory: improved aeration, clear bilaterally, no wheezing, no rhonchi Abdominal: Soft, NT, ND, bowel sounds + Extremities: no edema, no cyanosis    The results of significant diagnostics from this hospitalization (including imaging, microbiology, ancillary and laboratory) are listed below for reference.     Microbiology: No results found for this or any previous visit (from the past 240 hour(s)).   Labs: BNP (last 3 results) Recent Labs    09/21/20 0425 09/22/20 0643 09/23/20 0434  BNP 93.5 87.7 95.8   Basic Metabolic Panel: Recent Labs  Lab 09/17/20 0407 09/18/20 0401 09/20/20 0755 09/21/20 0425 09/22/20 0643 09/23/20 0434  NA 134* 136 141 138  --   --   K 5.0 4.4 3.9 3.8  --   --   CL 92* 92* 95* 94*  --   --   CO2 31 33* 36* 33*  --   --   GLUCOSE 128* 113* 109* 117*  --   --   BUN 55* 64* 80* 68*  --   --   CREATININE 1.13 1.11 0.98 1.02  --   --   CALCIUM 9.6 9.7 9.9 9.9  --   --   MG  --   --  2.8* 2.7* 2.6* 2.5*   Liver Function Tests: Recent Labs  Lab 09/17/20 0407 09/20/20 0755 09/21/20 0425  AST ALT 37 30 34  ALKPHOS 80 75 72  BILITOT 1.2 1.0 1.0  PROT 7.1 6.6 6.1*  ALBUMIN 3.7 3.5 3.2*   No results for input(s): LIPASE, AMYLASE in the last 168 hours. No results for input(s): AMMONIA in the last 168 hours. CBC: Recent Labs  Lab 09/17/20 0407 09/20/20 0755 09/21/20 0425 09/22/20 0643 09/23/20 0434  WBC 16.9* 17.5* 17.2* 16.0* 15.6*  NEUTROABS 15.8* 15.2* 15.3* 13.7* 13.2*  HGB 14.4 13.5 13.1 12.8* 12.6*  HCT 42.5 39.9 39.1 38.4* 38.1*  MCV 90.8 91.9 90.7 91.2 92.0  PLT 382 338 331 330 315   Cardiac Enzymes: No results for input(s): CKTOTAL, CKMB, CKMBINDEX, TROPONINI in the last 168 hours. BNP: Invalid input(s): POCBNP CBG: No results for input(s): GLUCAP  in the last 168 hours. D-Dimer No results for input(s): DDIMER in the last 72 hours. Hgb A1c No results for input(s): HGBA1C in the last 72 hours. Lipid Profile No results for input(s): CHOL, HDL, LDLCALC, TRIG, CHOLHDL, LDLDIRECT in the last 72 hours. Thyroid function studies No results for input(s): TSH,  T4TOTAL, T3FREE, THYROIDAB in the last 72 hours.  Invalid input(s): FREET3 Anemia work up No results for input(s): VITAMINB12, FOLATE, FERRITIN, TIBC, IRON, RETICCTPCT in the last 72 hours. Urinalysis    Component Value Date/Time   COLORURINE YELLOW (A) 09/10/2020 1829   APPEARANCEUR CLEAR (A) 09/10/2020 1829   APPEARANCEUR Clear 11/08/2012 1714   LABSPEC 1.013 09/10/2020 1829   LABSPEC 1.006 11/08/2012 1714   PHURINE 6.0 09/10/2020 1829   GLUCOSEU NEGATIVE 09/10/2020 1829   GLUCOSEU Negative 11/08/2012 1714   HGBUR MODERATE (A) 09/10/2020 1829   BILIRUBINUR NEGATIVE 09/10/2020 1829   BILIRUBINUR Negative 11/08/2012 1714   KETONESUR 5 (A) 09/10/2020 1829   PROTEINUR NEGATIVE 09/10/2020 1829   NITRITE NEGATIVE 09/10/2020 1829   LEUKOCYTESUR NEGATIVE 09/10/2020 1829   LEUKOCYTESUR Negative 11/08/2012 1714   Sepsis Labs Invalid input(s): PROCALCITONIN,  WBC,  LACTICIDVEN Microbiology No results found for this or any previous visit (from the past 240 hour(s)).   Time coordinating discharge: Over 30 minutes  SIGNED:   Pennie Banter, DO [facility] 09/23/2020, 1:04 PM   If 7PM-7AM, please contact night-coverage [email or web address]

## 2020-09-23 NOTE — Progress Notes (Signed)
Nsg Discharge Note  Report given to [facility] at [facility] Patient waiting for [facility] at this time. AVS instructions placed in discharge packet.   Admit Date:  09/10/2020 Discharge date: 09/23/2020   [name] to be D/C'd Skilled nursing facility per MD order.  AVS completed.  Copy for chart, and copy for patient signed, and dated. Patient/caregiver able to verbalize understanding.  Discharge Medication: Allergies as of 09/23/2020       Reactions   Alpha Blocker Quinazolines    Clindamycin/lincomycin Nausea Only   Doxycycline Other (See Comments)   Levaquin [levofloxacin]    Penicillins    Singulair [montelukast Sodium] Other (See Comments)   Flu like symptoms   Sulfa Antibiotics Other (See Comments)   Tamsulosin Other (See Comments)        Medication List     STOP taking these medications    Lagevrio 200 MG Caps Generic drug: Molnupiravir       TAKE these medications    albuterol 108 (90 Base) MCG/ACT inhaler Commonly known as: VENTOLIN HFA Inhale 2 puffs into the lungs every 4 (four) hours as needed for wheezing or shortness of breath. What changed: when to take this   budesonide 0.5 MG/2ML nebulizer solution Commonly known as: PULMICORT INHALE 1 VIAL TWICE A DAY   feeding supplement Liqd Take 237 mLs by mouth 2 (two) times daily between meals.   fluticasone 50 MCG/ACT nasal spray Commonly known as: FLONASE Place 1 spray into both nostrils daily. Start taking on: September 24, 2020   ipratropium-albuterol 0.5-2.5 (3) MG/3ML Soln Commonly known as: DUONEB Take 3 mLs by nebulization every 8 (eight) hours. Dx:J44.9 What changed:  when to take this reasons to take this   lidocaine 2 % solution Commonly known as: XYLOCAINE Use as directed 15 mLs in the mouth or throat 4 (four) times daily - after meals and at bedtime. As needed for mouth pain   multivitamin with minerals Tabs tablet Take 1 tablet by mouth daily. Start taking on: September 24, 2020    nebivolol 5 MG tablet Commonly known as: BYSTOLIC Take 1 tablet (5 mg total) by mouth daily. Start taking on: September 24, 2020   nystatin 100000 UNIT/ML suspension Commonly known as: MYCOSTATIN Use as directed 5 mLs (500,000 Units total) in the mouth or throat 4 (four) times daily.   omeprazole 40 MG capsule Commonly known as: PRILOSEC Take 40 mg by mouth daily.   PARoxetine 10 MG tablet Commonly known as: PAXIL Take 1 tablet (10 mg total) by mouth daily. Start taking on: September 24, 2020   predniSONE 10 MG tablet Commonly known as: DELTASONE Take 1 tablet (10 mg total) by mouth daily for 3 days. What changed:  medication strength how much to take when to take this   Restasis 0.05 % ophthalmic emulsion Generic drug: cycloSPORINE               Discharge Care Instructions  (From admission, onward)           Start     Ordered   09/23/20 0000  Discharge wound care:       Comments: Cleanse Right forearm skin tear daily with normal saline, pat dry.  Cover with folded piece of xeroform gauze, top with dry gause 4x4's and secure with Kerlix or paper tape.   09/23/20 1302            Discharge Assessment: Vitals:   09/23/20 0722 09/23/20 0819  BP:  104/64  Pulse:  69  Resp:  17  Temp:  (!) 97.5 F (36.4 C)  SpO2: 94% 96%   Skin clean, dry and intact without evidence of skin break down, no evidence of skin tears noted. IV catheter discontinued intact. Site without signs and symptoms of complications - no redness or edema noted at insertion site, patient denies c/o pain - only slight tenderness at site.  Dressing with slight pressure applied.  D/c Instructions-Education: Discharge instructions given to patient/family with verbalized understanding. D/c education completed with patient/family including follow up instructions, medication list, d/c activities limitations if indicated, with other d/c instructions as indicated by MD - patient able to verbalize  understanding, all questions fully answered. Patient instructed to return to ED, call 911, or call MD for any changes in condition.  Patient escorted via WC, and D/C home via private auto.  Boykin Nearing, RN 09/23/2020 3:32 PM

## 2020-09-23 NOTE — TOC Progression Note (Addendum)
Transition of Care [facility]) - Progression Note    Patient Details  Name: Charles Webster MRN: [id number] Date of Birth: 1946-01-06  Transition of Care [facility]) CM/SW Contact  [name], LCSW Phone Number: 09/23/2020, 10:08 AM  Clinical Narrative:  Per daughter, SNF preference is [facility]. They have not responded to referral yet. Admissions coordinator is aware and will review.  12:18 pm: Dr. Denton Lank said patient is stable for discharge. [facility] has made a bed offer and said they have a bed available today. They can manage his oxygen needs as long as he is below 10 L. Sent her Dr. Denton Lank a secure chat to let her know. Daughter is aware of plan for discharge today.  1:18 pm: Updated patient. Daughter on her way from [facility]. Asked her to let me know when she arrives so transport can be arranged.  Expected Discharge Plan: Home w Home Health Services Barriers to Discharge: Continued Medical Work up  Expected Discharge Plan and Services Expected Discharge Plan: Home w Home Health Services     Post Acute Care Choice: Home Health Living arrangements for the past 2 months: Single Family Home                           HH Arranged: RN, PT, OT, Nurse's Aide HH Agency: [facility] Date HH Agency Contacted: 09/15/20   Representative spoke with at [facility] Agency: [name]   Social Determinants of Health (SDOH) Interventions    Readmission Risk Interventions No flowsheet data found.

## 2020-09-26 DIAGNOSIS — S41101S Unspecified open wound of right upper arm, sequela: Secondary | ICD-10-CM | POA: Diagnosis not present

## 2020-09-26 DIAGNOSIS — J441 Chronic obstructive pulmonary disease with (acute) exacerbation: Secondary | ICD-10-CM | POA: Diagnosis not present

## 2020-09-26 DIAGNOSIS — J9620 Acute and chronic respiratory failure, unspecified whether with hypoxia or hypercapnia: Secondary | ICD-10-CM | POA: Diagnosis not present

## 2020-09-26 DIAGNOSIS — K219 Gastro-esophageal reflux disease without esophagitis: Secondary | ICD-10-CM | POA: Diagnosis not present

## 2020-09-26 DIAGNOSIS — F419 Anxiety disorder, unspecified: Secondary | ICD-10-CM | POA: Diagnosis not present

## 2020-09-26 DIAGNOSIS — F32A Depression, unspecified: Secondary | ICD-10-CM | POA: Diagnosis not present

## 2020-09-26 DIAGNOSIS — I10 Essential (primary) hypertension: Secondary | ICD-10-CM | POA: Diagnosis not present

## 2020-09-26 DIAGNOSIS — I5032 Chronic diastolic (congestive) heart failure: Secondary | ICD-10-CM | POA: Diagnosis not present

## 2020-09-27 DIAGNOSIS — J441 Chronic obstructive pulmonary disease with (acute) exacerbation: Secondary | ICD-10-CM | POA: Diagnosis not present

## 2020-09-27 DIAGNOSIS — F419 Anxiety disorder, unspecified: Secondary | ICD-10-CM | POA: Diagnosis not present

## 2020-09-27 DIAGNOSIS — I130 Hypertensive heart and chronic kidney disease with heart failure and stage 1 through stage 4 chronic kidney disease, or unspecified chronic kidney disease: Secondary | ICD-10-CM | POA: Diagnosis not present

## 2020-09-27 DIAGNOSIS — J9620 Acute and chronic respiratory failure, unspecified whether with hypoxia or hypercapnia: Secondary | ICD-10-CM | POA: Diagnosis not present

## 2020-09-27 DIAGNOSIS — K219 Gastro-esophageal reflux disease without esophagitis: Secondary | ICD-10-CM | POA: Diagnosis not present

## 2020-09-30 DIAGNOSIS — J9620 Acute and chronic respiratory failure, unspecified whether with hypoxia or hypercapnia: Secondary | ICD-10-CM | POA: Diagnosis not present

## 2020-09-30 DIAGNOSIS — K5900 Constipation, unspecified: Secondary | ICD-10-CM | POA: Diagnosis not present

## 2020-09-30 DIAGNOSIS — J441 Chronic obstructive pulmonary disease with (acute) exacerbation: Secondary | ICD-10-CM | POA: Diagnosis not present

## 2020-10-04 DIAGNOSIS — J441 Chronic obstructive pulmonary disease with (acute) exacerbation: Secondary | ICD-10-CM | POA: Diagnosis not present

## 2020-10-04 DIAGNOSIS — K5900 Constipation, unspecified: Secondary | ICD-10-CM | POA: Diagnosis not present

## 2020-10-04 DIAGNOSIS — J9620 Acute and chronic respiratory failure, unspecified whether with hypoxia or hypercapnia: Secondary | ICD-10-CM | POA: Diagnosis not present

## 2020-10-04 DIAGNOSIS — D649 Anemia, unspecified: Secondary | ICD-10-CM | POA: Diagnosis not present

## 2020-10-06 DIAGNOSIS — K5900 Constipation, unspecified: Secondary | ICD-10-CM | POA: Diagnosis not present

## 2020-10-06 DIAGNOSIS — J9620 Acute and chronic respiratory failure, unspecified whether with hypoxia or hypercapnia: Secondary | ICD-10-CM | POA: Diagnosis not present

## 2020-10-06 DIAGNOSIS — J441 Chronic obstructive pulmonary disease with (acute) exacerbation: Secondary | ICD-10-CM | POA: Diagnosis not present

## 2020-10-06 DIAGNOSIS — D649 Anemia, unspecified: Secondary | ICD-10-CM | POA: Diagnosis not present

## 2020-10-11 DIAGNOSIS — J441 Chronic obstructive pulmonary disease with (acute) exacerbation: Secondary | ICD-10-CM | POA: Diagnosis not present

## 2020-10-11 DIAGNOSIS — K5900 Constipation, unspecified: Secondary | ICD-10-CM | POA: Diagnosis not present

## 2020-10-11 DIAGNOSIS — D649 Anemia, unspecified: Secondary | ICD-10-CM | POA: Diagnosis not present

## 2020-10-11 DIAGNOSIS — J9620 Acute and chronic respiratory failure, unspecified whether with hypoxia or hypercapnia: Secondary | ICD-10-CM | POA: Diagnosis not present

## 2020-10-12 DIAGNOSIS — J9620 Acute and chronic respiratory failure, unspecified whether with hypoxia or hypercapnia: Secondary | ICD-10-CM | POA: Diagnosis not present

## 2020-10-12 DIAGNOSIS — J189 Pneumonia, unspecified organism: Secondary | ICD-10-CM | POA: Diagnosis not present

## 2020-10-14 DIAGNOSIS — J189 Pneumonia, unspecified organism: Secondary | ICD-10-CM | POA: Diagnosis not present

## 2020-10-14 DIAGNOSIS — J9620 Acute and chronic respiratory failure, unspecified whether with hypoxia or hypercapnia: Secondary | ICD-10-CM | POA: Diagnosis not present

## 2020-10-14 DIAGNOSIS — D649 Anemia, unspecified: Secondary | ICD-10-CM | POA: Diagnosis not present

## 2020-10-18 DIAGNOSIS — J9620 Acute and chronic respiratory failure, unspecified whether with hypoxia or hypercapnia: Secondary | ICD-10-CM | POA: Diagnosis not present

## 2020-10-18 DIAGNOSIS — R42 Dizziness and giddiness: Secondary | ICD-10-CM | POA: Diagnosis not present

## 2020-10-18 DIAGNOSIS — J189 Pneumonia, unspecified organism: Secondary | ICD-10-CM | POA: Diagnosis not present

## 2020-10-18 DIAGNOSIS — D649 Anemia, unspecified: Secondary | ICD-10-CM | POA: Diagnosis not present

## 2020-10-18 DIAGNOSIS — I10 Essential (primary) hypertension: Secondary | ICD-10-CM | POA: Diagnosis not present

## 2020-10-21 DIAGNOSIS — J9620 Acute and chronic respiratory failure, unspecified whether with hypoxia or hypercapnia: Secondary | ICD-10-CM | POA: Diagnosis not present

## 2020-10-21 DIAGNOSIS — D649 Anemia, unspecified: Secondary | ICD-10-CM | POA: Diagnosis not present

## 2020-10-21 DIAGNOSIS — I10 Essential (primary) hypertension: Secondary | ICD-10-CM | POA: Diagnosis not present

## 2020-10-24 ENCOUNTER — Inpatient Hospital Stay: Payer: Medicare Other | Admitting: Primary Care

## 2020-10-25 DIAGNOSIS — J441 Chronic obstructive pulmonary disease with (acute) exacerbation: Secondary | ICD-10-CM | POA: Diagnosis not present

## 2020-10-25 DIAGNOSIS — F419 Anxiety disorder, unspecified: Secondary | ICD-10-CM | POA: Diagnosis not present

## 2020-10-25 DIAGNOSIS — K219 Gastro-esophageal reflux disease without esophagitis: Secondary | ICD-10-CM | POA: Diagnosis not present

## 2020-10-25 DIAGNOSIS — I130 Hypertensive heart and chronic kidney disease with heart failure and stage 1 through stage 4 chronic kidney disease, or unspecified chronic kidney disease: Secondary | ICD-10-CM | POA: Diagnosis not present

## 2020-10-25 DIAGNOSIS — I959 Hypotension, unspecified: Secondary | ICD-10-CM | POA: Diagnosis not present

## 2020-10-25 DIAGNOSIS — J9620 Acute and chronic respiratory failure, unspecified whether with hypoxia or hypercapnia: Secondary | ICD-10-CM | POA: Diagnosis not present

## 2020-11-10 DIAGNOSIS — M6281 Muscle weakness (generalized): Secondary | ICD-10-CM | POA: Diagnosis not present

## 2020-11-10 DIAGNOSIS — J441 Chronic obstructive pulmonary disease with (acute) exacerbation: Secondary | ICD-10-CM | POA: Diagnosis not present

## 2020-11-10 DIAGNOSIS — F32A Depression, unspecified: Secondary | ICD-10-CM | POA: Diagnosis not present

## 2020-11-10 DIAGNOSIS — I130 Hypertensive heart and chronic kidney disease with heart failure and stage 1 through stage 4 chronic kidney disease, or unspecified chronic kidney disease: Secondary | ICD-10-CM | POA: Diagnosis not present

## 2020-11-11 DIAGNOSIS — J449 Chronic obstructive pulmonary disease, unspecified: Secondary | ICD-10-CM | POA: Diagnosis not present

## 2020-11-11 DIAGNOSIS — M6281 Muscle weakness (generalized): Secondary | ICD-10-CM | POA: Diagnosis not present

## 2020-11-11 DIAGNOSIS — F419 Anxiety disorder, unspecified: Secondary | ICD-10-CM | POA: Diagnosis not present

## 2020-11-11 DIAGNOSIS — I130 Hypertensive heart and chronic kidney disease with heart failure and stage 1 through stage 4 chronic kidney disease, or unspecified chronic kidney disease: Secondary | ICD-10-CM | POA: Diagnosis not present

## 2020-11-11 DIAGNOSIS — J9611 Chronic respiratory failure with hypoxia: Secondary | ICD-10-CM | POA: Diagnosis not present

## 2020-11-11 DIAGNOSIS — I5033 Acute on chronic diastolic (congestive) heart failure: Secondary | ICD-10-CM | POA: Diagnosis not present

## 2020-11-11 DIAGNOSIS — Z8701 Personal history of pneumonia (recurrent): Secondary | ICD-10-CM | POA: Diagnosis not present

## 2020-11-11 DIAGNOSIS — K219 Gastro-esophageal reflux disease without esophagitis: Secondary | ICD-10-CM | POA: Diagnosis not present

## 2020-11-11 DIAGNOSIS — Z87891 Personal history of nicotine dependence: Secondary | ICD-10-CM | POA: Diagnosis not present

## 2020-11-11 DIAGNOSIS — Z9181 History of falling: Secondary | ICD-10-CM | POA: Diagnosis not present

## 2020-11-11 DIAGNOSIS — D649 Anemia, unspecified: Secondary | ICD-10-CM | POA: Diagnosis not present

## 2020-11-11 DIAGNOSIS — Z9981 Dependence on supplemental oxygen: Secondary | ICD-10-CM | POA: Diagnosis not present

## 2020-11-11 DIAGNOSIS — J441 Chronic obstructive pulmonary disease with (acute) exacerbation: Secondary | ICD-10-CM | POA: Diagnosis not present

## 2020-11-11 DIAGNOSIS — Z902 Acquired absence of lung [part of]: Secondary | ICD-10-CM | POA: Diagnosis not present

## 2020-11-11 DIAGNOSIS — I110 Hypertensive heart disease with heart failure: Secondary | ICD-10-CM | POA: Diagnosis not present

## 2020-11-11 DIAGNOSIS — F32A Depression, unspecified: Secondary | ICD-10-CM | POA: Diagnosis not present

## 2020-11-11 DIAGNOSIS — K5792 Diverticulitis of intestine, part unspecified, without perforation or abscess without bleeding: Secondary | ICD-10-CM | POA: Diagnosis not present

## 2020-11-11 DIAGNOSIS — Z8616 Personal history of COVID-19: Secondary | ICD-10-CM | POA: Diagnosis not present

## 2020-11-11 DIAGNOSIS — K449 Diaphragmatic hernia without obstruction or gangrene: Secondary | ICD-10-CM | POA: Diagnosis not present

## 2020-11-14 DIAGNOSIS — I5033 Acute on chronic diastolic (congestive) heart failure: Secondary | ICD-10-CM | POA: Diagnosis not present

## 2020-11-14 DIAGNOSIS — J449 Chronic obstructive pulmonary disease, unspecified: Secondary | ICD-10-CM | POA: Diagnosis not present

## 2020-11-14 DIAGNOSIS — J9611 Chronic respiratory failure with hypoxia: Secondary | ICD-10-CM | POA: Diagnosis not present

## 2020-11-14 DIAGNOSIS — D649 Anemia, unspecified: Secondary | ICD-10-CM | POA: Diagnosis not present

## 2020-11-14 DIAGNOSIS — I110 Hypertensive heart disease with heart failure: Secondary | ICD-10-CM | POA: Diagnosis not present

## 2020-11-14 DIAGNOSIS — K219 Gastro-esophageal reflux disease without esophagitis: Secondary | ICD-10-CM | POA: Diagnosis not present

## 2020-11-16 DIAGNOSIS — I5033 Acute on chronic diastolic (congestive) heart failure: Secondary | ICD-10-CM | POA: Diagnosis not present

## 2020-11-16 DIAGNOSIS — I110 Hypertensive heart disease with heart failure: Secondary | ICD-10-CM | POA: Diagnosis not present

## 2020-11-16 DIAGNOSIS — J9611 Chronic respiratory failure with hypoxia: Secondary | ICD-10-CM | POA: Diagnosis not present

## 2020-11-16 DIAGNOSIS — J449 Chronic obstructive pulmonary disease, unspecified: Secondary | ICD-10-CM | POA: Diagnosis not present

## 2020-11-16 DIAGNOSIS — D649 Anemia, unspecified: Secondary | ICD-10-CM | POA: Diagnosis not present

## 2020-11-16 DIAGNOSIS — K219 Gastro-esophageal reflux disease without esophagitis: Secondary | ICD-10-CM | POA: Diagnosis not present

## 2020-11-17 DIAGNOSIS — J449 Chronic obstructive pulmonary disease, unspecified: Secondary | ICD-10-CM | POA: Diagnosis not present

## 2020-11-17 DIAGNOSIS — D649 Anemia, unspecified: Secondary | ICD-10-CM | POA: Diagnosis not present

## 2020-11-17 DIAGNOSIS — J9611 Chronic respiratory failure with hypoxia: Secondary | ICD-10-CM | POA: Diagnosis not present

## 2020-11-17 DIAGNOSIS — I5033 Acute on chronic diastolic (congestive) heart failure: Secondary | ICD-10-CM | POA: Diagnosis not present

## 2020-11-17 DIAGNOSIS — K219 Gastro-esophageal reflux disease without esophagitis: Secondary | ICD-10-CM | POA: Diagnosis not present

## 2020-11-17 DIAGNOSIS — I110 Hypertensive heart disease with heart failure: Secondary | ICD-10-CM | POA: Diagnosis not present

## 2020-11-18 DIAGNOSIS — I5033 Acute on chronic diastolic (congestive) heart failure: Secondary | ICD-10-CM | POA: Diagnosis not present

## 2020-11-18 DIAGNOSIS — J449 Chronic obstructive pulmonary disease, unspecified: Secondary | ICD-10-CM | POA: Diagnosis not present

## 2020-11-18 DIAGNOSIS — J9611 Chronic respiratory failure with hypoxia: Secondary | ICD-10-CM | POA: Diagnosis not present

## 2020-11-18 DIAGNOSIS — I110 Hypertensive heart disease with heart failure: Secondary | ICD-10-CM | POA: Diagnosis not present

## 2020-11-18 DIAGNOSIS — D649 Anemia, unspecified: Secondary | ICD-10-CM | POA: Diagnosis not present

## 2020-11-18 DIAGNOSIS — K219 Gastro-esophageal reflux disease without esophagitis: Secondary | ICD-10-CM | POA: Diagnosis not present

## 2020-11-21 ENCOUNTER — Other Ambulatory Visit: Payer: Self-pay

## 2020-11-21 ENCOUNTER — Inpatient Hospital Stay
Admission: EM | Admit: 2020-11-21 | Discharge: 2020-11-25 | DRG: 291 | Disposition: A | Discharge status: Home / Self Care | Payer: Medicare Other | Attending: Internal Medicine | Admitting: Internal Medicine

## 2020-11-21 ENCOUNTER — Emergency Department: Payer: Medicare Other

## 2020-11-21 ENCOUNTER — Encounter: Payer: Self-pay | Admitting: *Deleted

## 2020-11-21 DIAGNOSIS — Z7951 Long term (current) use of inhaled steroids: Secondary | ICD-10-CM

## 2020-11-21 DIAGNOSIS — D72829 Elevated white blood cell count, unspecified: Secondary | ICD-10-CM | POA: Diagnosis present

## 2020-11-21 DIAGNOSIS — J9622 Acute and chronic respiratory failure with hypercapnia: Secondary | ICD-10-CM | POA: Diagnosis not present

## 2020-11-21 DIAGNOSIS — Z87891 Personal history of nicotine dependence: Secondary | ICD-10-CM | POA: Diagnosis not present

## 2020-11-21 DIAGNOSIS — Z841 Family history of disorders of kidney and ureter: Secondary | ICD-10-CM

## 2020-11-21 DIAGNOSIS — I5033 Acute on chronic diastolic (congestive) heart failure: Principal | ICD-10-CM | POA: Diagnosis present

## 2020-11-21 DIAGNOSIS — F419 Anxiety disorder, unspecified: Secondary | ICD-10-CM | POA: Diagnosis present

## 2020-11-21 DIAGNOSIS — Z882 Allergy status to sulfonamides status: Secondary | ICD-10-CM | POA: Diagnosis not present

## 2020-11-21 DIAGNOSIS — Z79899 Other long term (current) drug therapy: Secondary | ICD-10-CM

## 2020-11-21 DIAGNOSIS — D638 Anemia in other chronic diseases classified elsewhere: Secondary | ICD-10-CM | POA: Diagnosis not present

## 2020-11-21 DIAGNOSIS — R0902 Hypoxemia: Secondary | ICD-10-CM | POA: Diagnosis not present

## 2020-11-21 DIAGNOSIS — M545 Low back pain, unspecified: Secondary | ICD-10-CM | POA: Diagnosis present

## 2020-11-21 DIAGNOSIS — Z66 Do not resuscitate: Secondary | ICD-10-CM | POA: Diagnosis present

## 2020-11-21 DIAGNOSIS — J9621 Acute and chronic respiratory failure with hypoxia: Secondary | ICD-10-CM | POA: Diagnosis present

## 2020-11-21 DIAGNOSIS — I5020 Unspecified systolic (congestive) heart failure: Secondary | ICD-10-CM | POA: Diagnosis not present

## 2020-11-21 DIAGNOSIS — Z23 Encounter for immunization: Secondary | ICD-10-CM | POA: Diagnosis not present

## 2020-11-21 DIAGNOSIS — I110 Hypertensive heart disease with heart failure: Secondary | ICD-10-CM | POA: Diagnosis not present

## 2020-11-21 DIAGNOSIS — J309 Allergic rhinitis, unspecified: Secondary | ICD-10-CM | POA: Diagnosis not present

## 2020-11-21 DIAGNOSIS — E876 Hypokalemia: Secondary | ICD-10-CM | POA: Diagnosis not present

## 2020-11-21 DIAGNOSIS — J439 Emphysema, unspecified: Secondary | ICD-10-CM | POA: Diagnosis not present

## 2020-11-21 DIAGNOSIS — Z681 Body mass index (BMI) 19 or less, adult: Secondary | ICD-10-CM | POA: Diagnosis not present

## 2020-11-21 DIAGNOSIS — Z8616 Personal history of COVID-19: Secondary | ICD-10-CM | POA: Diagnosis not present

## 2020-11-21 DIAGNOSIS — Z902 Acquired absence of lung [part of]: Secondary | ICD-10-CM | POA: Diagnosis not present

## 2020-11-21 DIAGNOSIS — J9611 Chronic respiratory failure with hypoxia: Secondary | ICD-10-CM | POA: Diagnosis present

## 2020-11-21 DIAGNOSIS — Z9981 Dependence on supplemental oxygen: Secondary | ICD-10-CM | POA: Diagnosis not present

## 2020-11-21 DIAGNOSIS — R0602 Shortness of breath: Secondary | ICD-10-CM | POA: Diagnosis not present

## 2020-11-21 DIAGNOSIS — I509 Heart failure, unspecified: Secondary | ICD-10-CM

## 2020-11-21 DIAGNOSIS — Z888 Allergy status to other drugs, medicaments and biological substances status: Secondary | ICD-10-CM

## 2020-11-21 DIAGNOSIS — Z8249 Family history of ischemic heart disease and other diseases of the circulatory system: Secondary | ICD-10-CM

## 2020-11-21 DIAGNOSIS — K219 Gastro-esophageal reflux disease without esophagitis: Secondary | ICD-10-CM | POA: Diagnosis not present

## 2020-11-21 DIAGNOSIS — D649 Anemia, unspecified: Secondary | ICD-10-CM | POA: Diagnosis not present

## 2020-11-21 DIAGNOSIS — R0600 Dyspnea, unspecified: Secondary | ICD-10-CM | POA: Diagnosis not present

## 2020-11-21 DIAGNOSIS — J441 Chronic obstructive pulmonary disease with (acute) exacerbation: Secondary | ICD-10-CM | POA: Diagnosis not present

## 2020-11-21 DIAGNOSIS — Z880 Allergy status to penicillin: Secondary | ICD-10-CM

## 2020-11-21 DIAGNOSIS — M62838 Other muscle spasm: Secondary | ICD-10-CM | POA: Diagnosis present

## 2020-11-21 DIAGNOSIS — J449 Chronic obstructive pulmonary disease, unspecified: Secondary | ICD-10-CM | POA: Diagnosis present

## 2020-11-21 DIAGNOSIS — Z20822 Contact with and (suspected) exposure to covid-19: Secondary | ICD-10-CM | POA: Diagnosis present

## 2020-11-21 DIAGNOSIS — Z7952 Long term (current) use of systemic steroids: Secondary | ICD-10-CM

## 2020-11-21 DIAGNOSIS — R609 Edema, unspecified: Secondary | ICD-10-CM | POA: Diagnosis not present

## 2020-11-21 DIAGNOSIS — M7989 Other specified soft tissue disorders: Secondary | ICD-10-CM | POA: Diagnosis present

## 2020-11-21 DIAGNOSIS — T380X5A Adverse effect of glucocorticoids and synthetic analogues, initial encounter: Secondary | ICD-10-CM | POA: Diagnosis present

## 2020-11-21 DIAGNOSIS — Z881 Allergy status to other antibiotic agents status: Secondary | ICD-10-CM

## 2020-11-21 LAB — BASIC METABOLIC PANEL
Anion gap: 7 (ref 5–15)
BUN: 22 mg/dL (ref 8–23)
CO2: 31 mmol/L (ref 22–32)
Calcium: 9 mg/dL (ref 8.9–10.3)
Chloride: 104 mmol/L (ref 98–111)
Creatinine, Ser: 1.01 mg/dL (ref 0.61–1.24)
GFR, Estimated: >60 mL/min (ref >60)
Glucose, Bld: 108 mg/dL — ABNORMAL HIGH (ref 70–99)
Potassium: 4.1 mmol/L (ref 3.5–5.1)
Sodium: 142 mmol/L (ref 135–145)

## 2020-11-21 LAB — CBC
HCT: 27.9 % — ABNORMAL LOW (ref 39.0–52.0)
Hemoglobin: 9.1 g/dL — ABNORMAL LOW (ref 13.0–17.0)
MCH: 30.4 pg (ref 26.0–34.0)
MCHC: 32.6 g/dL (ref 30.0–36.0)
MCV: 93.3 fL (ref 80.0–100.0)
Platelets: 374 10*3/uL (ref 150–400)
RBC: 2.99 MIL/uL — ABNORMAL LOW (ref 4.22–5.81)
RDW: 15.4 % (ref 11.5–15.5)
WBC: 11.2 10*3/uL — ABNORMAL HIGH (ref 4.0–10.5)
nRBC: 0 % (ref 0.0–0.2)

## 2020-11-21 LAB — PROCALCITONIN: Procalcitonin: <0.1 ng/mL

## 2020-11-21 LAB — RESP PANEL BY RT-PCR (FLU A&B, COVID) ARPGX2
Influenza A by PCR: NEGATIVE
Influenza B by PCR: NEGATIVE
SARS Coronavirus 2 by RT PCR: NEGATIVE

## 2020-11-21 LAB — TROPONIN I (HIGH SENSITIVITY)
Troponin I (High Sensitivity): 10 ng/L (ref <18)
Troponin I (High Sensitivity): 9 ng/L (ref <18)

## 2020-11-21 MED ORDER — FUROSEMIDE 10 MG/ML IJ SOLN
60.0000 mg | Freq: Once | INTRAMUSCULAR | Status: AC
  Administered 2020-11-21: 60 mg via INTRAVENOUS
  Filled 2020-11-21: qty 8

## 2020-11-21 MED ORDER — ACETAMINOPHEN 325 MG PO TABS: 650.0000 mg | ORAL_TABLET | Freq: Four times a day (QID) | ORAL | Status: DC | PRN

## 2020-11-21 MED ORDER — IPRATROPIUM-ALBUTEROL 0.5-2.5 (3) MG/3ML IN SOLN
3.0000 mL | Freq: Once | RESPIRATORY_TRACT | Status: AC
  Administered 2020-11-21: 3 mL via RESPIRATORY_TRACT
  Filled 2020-11-21: qty 3

## 2020-11-21 MED ORDER — METHYLPREDNISOLONE SODIUM SUCC 40 MG IJ SOLR
40.0000 mg | Freq: Four times a day (QID) | INTRAMUSCULAR | Status: DC
  Administered 2020-11-22 – 2020-11-24 (×10): 40 mg via INTRAVENOUS
  Filled 2020-11-21 (×10): qty 1

## 2020-11-21 MED ORDER — ONDANSETRON HCL 4 MG/2ML IJ SOLN: 4.0000 mg | Freq: Four times a day (QID) | INTRAMUSCULAR | Status: DC | PRN

## 2020-11-21 MED ORDER — ACETAMINOPHEN 650 MG RE SUPP: 650.0000 mg | Freq: Four times a day (QID) | RECTAL | Status: DC | PRN

## 2020-11-21 MED ORDER — SODIUM CHLORIDE 0.9 % IV SOLN
500.0000 mg | INTRAVENOUS | Status: DC
  Administered 2020-11-22 – 2020-11-23 (×3): 500 mg via INTRAVENOUS
  Filled 2020-11-21 (×4): qty 500

## 2020-11-21 MED ORDER — ONDANSETRON HCL 4 MG PO TABS: 4.0000 mg | ORAL_TABLET | Freq: Four times a day (QID) | ORAL | Status: DC | PRN

## 2020-11-21 MED ORDER — ENOXAPARIN SODIUM 40 MG/0.4ML IJ SOSY
40.0000 mg | PREFILLED_SYRINGE | INTRAMUSCULAR | Status: DC
  Administered 2020-11-22 – 2020-11-25 (×4): 40 mg via SUBCUTANEOUS
  Filled 2020-11-21 (×4): qty 0.4

## 2020-11-21 MED ORDER — METHYLPREDNISOLONE SODIUM SUCC 125 MG IJ SOLR
125.0000 mg | Freq: Once | INTRAMUSCULAR | Status: AC
  Administered 2020-11-21: 125 mg via INTRAVENOUS
  Filled 2020-11-21: qty 2

## 2020-11-21 MED ORDER — SODIUM CHLORIDE 0.45 % IV SOLN: INTRAVENOUS | Status: DC

## 2020-11-21 NOTE — ED Triage Notes (Signed)
Pt brought in via ems from home with sob.  Hx copd.  Iv in place. Pt on 6 liters [place] oxygen.  Pt alert  speech clear.  No chest pain.

## 2020-11-21 NOTE — ED Provider Notes (Signed)
[facility] Emergency Department Provider Note  Time seen: 4:00 PM  I have reviewed the triage vital signs and the nursing notes.   HISTORY  Chief Complaint Shortness of Breath  HPI Charles Webster is a 75 y.o. male with a past medical history of COPD, diverticulitis, gastric reflux, on 5 L nasal cannula chronically who presents to the emergency department for shortness of breath.  According to the patient for the past 3 days he has had progressively worsening shortness of breath as well as somewhat increased cough.  No fever, no vomiting.  Denies any chest pain or diarrhea.   Past Medical History:  Diagnosis Date   Bronchitis    COPD (chronic obstructive pulmonary disease) (HCC)    Diverticulitis    GERD (gastroesophageal reflux disease)    Hiatal hernia     Patient Active Problem List   Diagnosis Date Noted   Palliative care by specialist    Shortness of breath 09/10/2020   COVID-19 virus infection 09/10/2020   COPD exacerbation (HCC) 05/05/2016   Allergic rhinitis 07/14/2013   Duodenitis 12/04/2012   Hiatal hernia 12/04/2012    Past Surgical History:  Procedure Laterality Date   LUNG REMOVAL, PARTIAL  2005   removed 20% of right lung    Prior to Admission medications   Medication Sig Start Date End Date Taking? Authorizing Provider  albuterol (VENTOLIN HFA) 108 (90 Base) MCG/ACT inhaler Inhale 2 puffs into the lungs every 4 (four) hours as needed for wheezing or shortness of breath. 09/23/20   Esaw Grandchild A, DO  budesonide (PULMICORT) 0.5 MG/2ML nebulizer solution INHALE 1 VIAL TWICE A DAY 09/01/20   Erin Fulling, MD  feeding supplement (ENSURE ENLIVE / ENSURE PLUS) LIQD Take 237 mLs by mouth 2 (two) times daily between meals. 09/23/20   Pennie Banter, DO  fluticasone (FLONASE) 50 MCG/ACT nasal spray Place 1 spray into both nostrils daily. 09/24/20   Esaw Grandchild A, DO  ipratropium-albuterol (DUONEB) 0.5-2.5 (3) MG/3ML SOLN Take 3 mLs by  nebulization every 8 (eight) hours. Dx:J44.9 09/23/20   Esaw Grandchild A, DO  lidocaine (XYLOCAINE) 2 % solution Use as directed 15 mLs in the mouth or throat 4 (four) times daily - after meals and at bedtime. As needed for mouth pain 09/23/20   Esaw Grandchild A, DO  Multiple Vitamin (MULTIVITAMIN WITH MINERALS) TABS tablet Take 1 tablet by mouth daily. 09/24/20   Pennie Banter, DO  nebivolol (BYSTOLIC) 5 MG tablet Take 1 tablet (5 mg total) by mouth daily. 09/24/20   Pennie Banter, DO  nystatin (MYCOSTATIN) 100000 UNIT/ML suspension Use as directed 5 mLs (500,000 Units total) in the mouth or throat 4 (four) times daily. 09/23/20   Pennie Banter, DO  omeprazole (PRILOSEC) 40 MG capsule Take 40 mg by mouth daily.    [provider]  PARoxetine (PAXIL) 10 MG tablet Take 1 tablet (10 mg total) by mouth daily. 09/24/20   Pennie Banter, DO  RESTASIS 0.05 % ophthalmic emulsion  05/26/20   [provider]    Allergies  Allergen Reactions   Alpha Blocker Quinazolines    Clindamycin/Lincomycin Nausea Only   Doxycycline Other (See Comments)   Levaquin [Levofloxacin]    Penicillins    Singulair [Montelukast Sodium] Other (See Comments)    Flu like symptoms   Sulfa Antibiotics Other (See Comments)   Tamsulosin Other (See Comments)    Family History  Problem Relation Age of Onset   Heart  disease Mother    Kidney failure Father     Social History Social History   Tobacco Use   Smoking status: Former    Packs/day: 1.00    Years: 40.00    Pack years: 40.00    Types: Cigarettes    Quit date: 10/04/2003    Years since quitting: 17.1   Smokeless tobacco: Never  Vaping Use   Vaping Use: Never used  Substance Use Topics   Alcohol use: No   Drug use: No    Review of Systems Constitutional: Negative for fever. Cardiovascular: Negative for chest pain. Respiratory: Positive for 3 days of progressively worsening shortness of breath as well as  cough. Gastrointestinal: Negative for abdominal pain Musculoskeletal: Negative for musculoskeletal complaints Neurological: Negative for headache All other ROS negative  ____________________________________________   PHYSICAL EXAM:  VITAL SIGNS: ED Triage Vitals  Enc Vitals Group     BP 11/21/20 1514 95/67     Pulse Rate 11/21/20 1514 87     Resp 11/21/20 1514 (!) 22     Temp 11/21/20 1514 98.4 F (36.9 C)     Temp Source 11/21/20 1514 Oral     SpO2 11/21/20 1514 99 %     Weight 11/21/20 1515 113 lb (51.3 kg)     Height 11/21/20 1515 5\' 6"  (1.676 m)     Head Circumference --      Peak Flow --      Pain Score 11/21/20 1515 0     Pain Loc --      Pain Edu? --      Excl. in GC? --    Constitutional: Alert and oriented. Well appearing and in no distress. Eyes: Normal exam ENT      Head: Normocephalic and atraumatic.      Mouth/Throat: Mucous membranes are moist. Cardiovascular: Normal rate, regular rhythm.  Respiratory: Moderate tachypnea around 30 breaths/min with somewhat increased work of breathing and diminished breath sounds bilaterally but no obvious wheeze rales or rhonchi. Gastrointestinal: Soft and nontender. No distention.   Musculoskeletal: Nontender with normal range of motion in all extremities.  Neurologic:  Normal speech and language. No gross focal neurologic deficits are appreciated. Skin:  Skin is warm, dry and intact.  Psychiatric: Mood and affect are normal.   ____________________________________________    EKG  EKG viewed and interpreted by myself shows a normal sinus rhythm 87 bpm with a narrow QRS, normal axis, normal intervals, no concerning ST changes.  ____________________________________________    RADIOLOGY  Chest x-ray shows emphysema, increased interstitial opacity suspicious for edema versus infection.  ____________________________________________   INITIAL IMPRESSION / ASSESSMENT AND PLAN / ED COURSE  Pertinent labs & imaging  results that were available during my care of the patient were reviewed by me and considered in my medical decision making (see chart for details).   Patient presents emergency department for worsening shortness of breath over the past 3 days along with cough.  No fever.  Patient has COPD at baseline wears 5 L of nasal cannula oxygen chronically.  Currently satting 95% on 6 L we will attempt to titrate to 5 L.  We will treat with duo nebs, Solu-Medrol obtain labs including cardiac enzymes EKG and a chest x-ray.  Symptoms are most suggestive of COPD exacerbation.  Patient's chest x-ray shows likely edema versus infection.  Symptoms are very suggestive of edema given lower extremity edema and no other pneumonia symptoms.  We will dose IV Lasix.  Patient continues to be  short of breath with tachypnea, has received Solu-Medrol and duo nebs as well as 60 mg of IV Lasix.  We will admit to the hospital service for further work-up and treatment.  RHYSE LOUX was evaluated in Emergency Department on 11/21/2020 for the symptoms described in the history of present illness. He was evaluated in the context of the global COVID-19 pandemic, which necessitated consideration that the patient might be at risk for infection with the SARS-CoV-2 virus that causes COVID-19. Institutional protocols and algorithms that pertain to the evaluation of patients at risk for COVID-19 are in a state of rapid change based on information released by regulatory bodies including the CDC and federal and state organizations. These policies and algorithms were followed during the patient's care in the ED.  ____________________________________________   FINAL CLINICAL IMPRESSION(S) / ED DIAGNOSES  Dyspnea COPD exacerbation CHF exacerbation   Minna Antis, MD 11/21/20 7902

## 2020-11-21 NOTE — H&P (Signed)
History and Physical   Charles Webster JPV:[id number] DOB: [birth year] DOA: 11/21/2020  Referring MD/NP/PA: Dr. Lenard Lance  PCP: Marina Goodell, MD   Outpatient Specialists: Dr. Belia Heman, pulmonology  Patient coming from: Home  Chief Complaint: Shortness of breath  HPI: Charles Webster is a 75 y.o. male with medical history significant of COPD, GERD, diverticulosis, who has advanced COPD and followed by pulmonary presenting with shortness of breath cough and wheezing.  Patient has been having significant symptoms and has been on oxygen at 5 L at home.  He has been using his nebulizer treatment 3 times to 4 times a day.  He has not been up to every 4 hours a day now but not getting better.  No chest pain.  Was seen in the ER.  Requiring 6 to 7 L on arrival.  Mild expiratory wheezing.  Patient being admitted to the hospital therefore with acute exacerbation of COPD.Marland Kitchen  ED Course: Temperature is 98.4, blood pressure is 93/66, pulse 100, respirate of 25 and oxygen sats 94% on 6 L.  White count is 11.2 hemoglobin 9.1 platelets 374.  Chest x-ray showed emphysema with increased interstitial and hazy bilateral lung opacity suspicious for superimposed edema or infection.  COVID-19 is negative.  Patient is being admitted for further evaluation and treatment of acute exacerbation of COPD  Review of Systems: As per HPI otherwise 10 point review of systems negative.    Past Medical History:  Diagnosis Date   Bronchitis    COPD (chronic obstructive pulmonary disease) (HCC)    Diverticulitis    GERD (gastroesophageal reflux disease)    Hiatal hernia     Past Surgical History:  Procedure Laterality Date   LUNG REMOVAL, PARTIAL  2005   removed 20% of right lung     reports that he quit smoking about 17 years ago. He has a 40.00 pack-year smoking history. He has never used smokeless tobacco. He reports that he does not drink alcohol and does not use drugs.  Allergies  Allergen Reactions   Alpha  Blocker Quinazolines    Clindamycin/Lincomycin Nausea Only   Doxycycline Other (See Comments)   Levaquin [Levofloxacin]    Penicillins    Singulair [Montelukast Sodium] Other (See Comments)    Flu like symptoms   Sulfa Antibiotics Other (See Comments)   Tamsulosin Other (See Comments)    Family History  Problem Relation Age of Onset   Heart disease Mother    Kidney failure Father      Prior to Admission medications   Medication Sig Start Date End Date Taking? Authorizing Provider  albuterol (VENTOLIN HFA) 108 (90 Base) MCG/ACT inhaler Inhale 2 puffs into the lungs every 4 (four) hours as needed for wheezing or shortness of breath. 09/23/20  Yes Esaw Grandchild A, DO  budesonide (PULMICORT) 0.5 MG/2ML nebulizer solution INHALE 1 VIAL TWICE A DAY 09/01/20  Yes Kasa, Wallis Bamberg, MD  cetirizine (ZYRTEC) 10 MG tablet Take 1 tablet by mouth daily as needed.   Yes [provider]  furosemide (LASIX) 20 MG tablet Take 20 mg by mouth daily. 11/16/20  Yes [provider]  ipratropium-albuterol (DUONEB) 0.5-2.5 (3) MG/3ML SOLN Take 3 mLs by nebulization every 8 (eight) hours. Dx:J44.9 09/23/20  Yes Esaw Grandchild A, DO  nebivolol (BYSTOLIC) 5 MG tablet Take 1 tablet (5 mg total) by mouth daily. Patient taking differently: Take 2.5 mg by mouth daily. 09/24/20  Yes Esaw Grandchild A, DO  omeprazole (PRILOSEC) 40 MG capsule Take 1 capsule  by mouth 2 (two) times daily. 06/07/20  Yes [provider]  predniSONE (DELTASONE) 20 MG tablet Take 1 tablet by mouth daily.   Yes [provider]  feeding supplement (ENSURE ENLIVE / ENSURE PLUS) LIQD Take 237 mLs by mouth 2 (two) times daily between meals. 09/23/20   Pennie Banter, DO  fluticasone (FLONASE) 50 MCG/ACT nasal spray Place 1 spray into both nostrils daily. Patient not taking: No sig reported 09/24/20   Esaw Grandchild A, DO  lidocaine (XYLOCAINE) 2 % solution Use as directed 15 mLs in the mouth or throat 4 (four) times  daily - after meals and at bedtime. As needed for mouth pain Patient not taking: No sig reported 09/23/20   Pennie Banter, DO  Multiple Vitamin (MULTIVITAMIN WITH MINERALS) TABS tablet Take 1 tablet by mouth daily. Patient not taking: No sig reported 09/24/20   Esaw Grandchild A, DO  nystatin (MYCOSTATIN) 100000 UNIT/ML suspension Use as directed 5 mLs (500,000 Units total) in the mouth or throat 4 (four) times daily. Patient not taking: No sig reported 09/23/20   Esaw Grandchild A, DO  PARoxetine (PAXIL) 10 MG tablet Take 1 tablet (10 mg total) by mouth daily. Patient not taking: No sig reported 09/24/20   Esaw Grandchild A, DO  RESTASIS 0.05 % ophthalmic emulsion  05/26/20   [provider]    Physical Exam: Vitals:   11/21/20 1700 11/21/20 1741 11/21/20 1800 11/21/20 2000  BP: 114/72  124/82 117/73  Pulse: 91 87 100 88  Resp: (!) 24 19 (!) 23 (!) 21  Temp:      TempSrc:      SpO2: 100% 100% 95% 100%  Weight:      Height:          Constitutional: Acutely ill looking in mild respiratory distress Vitals:   11/21/20 1700 11/21/20 1741 11/21/20 1800 11/21/20 2000  BP: 114/72  124/82 117/73  Pulse: 91 87 100 88  Resp: (!) 24 19 (!) 23 (!) 21  Temp:      TempSrc:      SpO2: 100% 100% 95% 100%  Weight:      Height:       Eyes: PERRL, lids and conjunctivae normal ENMT: Mucous membranes are moist. Posterior pharynx clear of any exudate or lesions.Normal dentition.  Neck: normal, supple, no masses, no thyromegaly Respiratory: Decreased air entry bilaterally with moderate expiratory wheezing normal respiratory effort. No accessory muscle use.  Cardiovascular: Regular rate and rhythm, no murmurs / rubs / gallops. No extremity edema. 2+ pedal pulses. No carotid bruits.  Abdomen: no tenderness, no masses palpated. No hepatosplenomegaly. Bowel sounds positive.  Musculoskeletal: no clubbing / cyanosis. No joint deformity upper and lower extremities. Good ROM, no contractures.  Normal muscle tone.  Skin: no rashes, lesions, ulcers. No induration Neurologic: CN 2-12 grossly intact. Sensation intact, DTR normal. Strength 5/5 in all 4.  Psychiatric: Normal judgment and insight. Alert and oriented x 3. Normal mood.     Labs on Admission: I have personally reviewed following labs and imaging studies  CBC: Recent Labs  Lab 11/21/20 1518  WBC 11.2*  HGB 9.1*  HCT 27.9*  MCV 93.3  PLT 374   Basic Metabolic Panel: Recent Labs  Lab 11/21/20 1518  NA 142  K 4.1  CL 104  CO2 31  GLUCOSE 108*  BUN 22  CREATININE 1.01  CALCIUM 9.0   GFR: Estimated Creatinine Clearance: 45.9 mL/min (by C-G formula based on SCr of 1.01  mg/dL). Liver Function Tests: No results for input(s): AST, ALT, ALKPHOS, BILITOT, PROT, ALBUMIN in the last 168 hours. No results for input(s): LIPASE, AMYLASE in the last 168 hours. No results for input(s): AMMONIA in the last 168 hours. Coagulation Profile: No results for input(s): INR, PROTIME in the last 168 hours. Cardiac Enzymes: No results for input(s): CKTOTAL, CKMB, CKMBINDEX, TROPONINI in the last 168 hours. BNP (last 3 results) No results for input(s): PROBNP in the last 8760 hours. HbA1C: No results for input(s): HGBA1C in the last 72 hours. CBG: No results for input(s): GLUCAP in the last 168 hours. Lipid Profile: No results for input(s): CHOL, HDL, LDLCALC, TRIG, CHOLHDL, LDLDIRECT in the last 72 hours. Thyroid Function Tests: No results for input(s): TSH, T4TOTAL, FREET4, T3FREE, THYROIDAB in the last 72 hours. Anemia Panel: No results for input(s): VITAMINB12, FOLATE, FERRITIN, TIBC, IRON, RETICCTPCT in the last 72 hours. Urine analysis:    Component Value Date/Time   COLORURINE YELLOW (A) 09/10/2020 1829   APPEARANCEUR CLEAR (A) 09/10/2020 1829   APPEARANCEUR Clear 11/08/2012 1714   LABSPEC 1.013 09/10/2020 1829   LABSPEC 1.006 11/08/2012 1714   PHURINE 6.0 09/10/2020 1829   GLUCOSEU NEGATIVE 09/10/2020 1829    GLUCOSEU Negative 11/08/2012 1714   HGBUR MODERATE (A) 09/10/2020 1829   BILIRUBINUR NEGATIVE 09/10/2020 1829   BILIRUBINUR Negative 11/08/2012 1714   KETONESUR 5 (A) 09/10/2020 1829   PROTEINUR NEGATIVE 09/10/2020 1829   NITRITE NEGATIVE 09/10/2020 1829   LEUKOCYTESUR NEGATIVE 09/10/2020 1829   LEUKOCYTESUR Negative 11/08/2012 1714   Sepsis Labs: @LABRCNTIP (procalcitonin:4,lacticidven:4) ) Recent Results (from the past 240 hour(s))  Resp Panel by RT-PCR (Flu A&B, Covid) Nasopharyngeal Swab     Status: None   Collection Time: 11/21/20  4:05 PM   Specimen: Nasopharyngeal Swab; Nasopharyngeal(NP) swabs in vial transport medium  Result Value Ref Range Status   SARS Coronavirus 2 by RT PCR NEGATIVE NEGATIVE Final    Comment: (NOTE) SARS-CoV-2 target nucleic acids are NOT DETECTED.  The SARS-CoV-2 RNA is generally detectable in upper respiratory specimens during the acute phase of infection. The lowest concentration of SARS-CoV-2 viral copies this assay can detect is 138 copies/mL. A negative result does not preclude SARS-Cov-2 infection and should not be used as the sole basis for treatment or other patient management decisions. A negative result may occur with  improper specimen collection/handling, submission of specimen other than nasopharyngeal swab, presence of viral mutation(s) within the areas targeted by this assay, and inadequate number of viral copies(<138 copies/mL). A negative result must be combined with clinical observations, patient history, and epidemiological information. The expected result is Negative.  Fact Sheet for Patients:  11/23/20  Fact Sheet for Healthcare Providers:  BloggerCourse.com  This test is no t yet approved or cleared by the [place] FDA and  has been authorized for detection and/or diagnosis of SARS-CoV-2 by FDA under an Emergency Use Authorization (EUA). This EUA will remain   in effect (meaning this test can be used) for the duration of the COVID-19 declaration under Section 564(b)(1) of the Act, 21 U.S.C.section 360bbb-3(b)(1), unless the authorization is terminated  or revoked sooner.       Influenza A by PCR NEGATIVE NEGATIVE Final   Influenza B by PCR NEGATIVE NEGATIVE Final    Comment: (NOTE) The Xpert Xpress SARS-CoV-2/FLU/RSV plus assay is intended as an aid in the diagnosis of influenza from Nasopharyngeal swab specimens and should not be used as a sole basis for treatment. Nasal washings and aspirates  are unacceptable for Xpert Xpress SARS-CoV-2/FLU/RSV testing.  Fact Sheet for Patients: BloggerCourse.com  Fact Sheet for Healthcare Providers: [employer]ious[employer]ker.it  This test is not yet approved or cleared by the [place] FDA and has been authorized for detection and/or diagnosis of SARS-CoV-2 by FDA under an Emergency Use Authorization (EUA). This EUA will remain in effect (meaning this test can be used) for the duration of the COVID-19 declaration under Section 564(b)(1) of the Act, 21 U.S.C. section 360bbb-3(b)(1), unless the authorization is terminated or revoked.  Performed at [facility], [address], [place], [place] [address]      Radiological Exams on Admission: DG Chest [facility] 1 View  Result Date: 11/21/2020 CLINICAL DATA:  Shortness of breath EXAM: PORTABLE CHEST 1 VIEW COMPARISON:  09/19/2020, CT 09/13/2020, 09/12/2020 FINDINGS: Emphysematous disease. Similar chronic postoperative change in the right thorax. Increased opacity within the right lung diffusely and left lung base. Stable cardiomediastinal silhouette with aortic atherosclerosis. No pneumothorax. IMPRESSION: Emphysema and chronic scarring and postsurgical changes on the right. Interim development of increased interstitial and hazy bilateral lung opacity suspicious for superimposed edema or infection  Electronically Signed   By: Jasmine Pang M.D.   On: 11/21/2020 16:23    EKG: Independently reviewed.  Sinus rhythm  Assessment/Plan Principal Problem:   Acute on chronic respiratory failure with hypoxia and hypercapnia (HCC) Active Problems:   COPD exacerbation (HCC)   Anemia   GERD (gastroesophageal reflux disease)     #1 acute occult respiratory failure with hypoxia: Patient will be admitted and initiated on IV steroids, antibiotics as well as oxygenation.  He appears to be slowly improving.  Continue other breathing treatments.  #2 COPD with acute exacerbation: Continue with steroids, antibiotics and other breathing treatments  #3 anemia of chronic disease: Continue to monitor H&H.  #4 GERD: Continue PPIs   DVT prophylaxis: Lovenox Code Status: DNR Family Communication: No family at bedside Disposition Plan: Home Consults called: None Admission status: Observation  Severity of Illness: The appropriate patient status for this patient is OBSERVATION. Observation status is judged to be reasonable and necessary in order to provide the required intensity of service to ensure the patient's safety. The patient's presenting symptoms, physical exam findings, and initial radiographic and laboratory data in the context of their medical condition is felt to place them at decreased risk for further clinical deterioration. Furthermore, it is anticipated that the patient will be medically stable for discharge from the hospital within 2 midnights of admission.    Lonia Blood MD [facility] Pager [phone]  If 7PM-7AM, please contact night-coverage [email or web address] Password [facility]  11/21/2020, 10:43 PM

## 2020-11-21 NOTE — ED Provider Notes (Signed)
Emergency Medicine Provider Triage Evaluation Note  Charles Webster , a 75 y.o. male  was evaluated in triage.  Pt complains of increasing shortness of breath.  Patient states that he has a history of COPD, has been feeling more short of breath.  No chest pain.  Denies any fevers or chills.  States that he believes that is his COPD and is causing issues.  Patient wears 5 L at rest.  Patient was bumped up to 6 L here in the emergency department..  Review of Systems  Positive: Shortness of breath Negative: Fevers, URI symptoms, chest pain, abdominal complaints  Physical Exam  BP 95/67 (BP Location: Left Arm)   Pulse 87   Temp 98.4 F (36.9 C) (Oral)   Resp (!) 22   Ht 5\' 6"  (1.676 m)   Wt 51.3 kg   SpO2 99%   BMI 18.24 kg/m  Gen:   Awake, no distress   Resp:  Normal effort.  Slight expiratory wheezing, slight tachypnea MSK:   Moves extremities without difficulty  Other:   Medical Decision Making  Medically screening exam initiated at 3:39 PM.  Appropriate orders placed.  was informed that the remainder of the evaluation will be completed by another provider, this initial triage assessment does not replace that evaluation, and the importance of remaining in the ED until their evaluation is complete.  Patient presents with increasing shortness of breath from home.  History of COPD.  He has been having issues with his COPD times several weeks.  On 5 L at baseline.  Patient will have labs, chest x-ray at this time.  EKG will also be performed.   [name] 11/21/20 1539    11/23/20, MD 11/21/20 2253

## 2020-11-22 DIAGNOSIS — J9622 Acute and chronic respiratory failure with hypercapnia: Secondary | ICD-10-CM | POA: Diagnosis not present

## 2020-11-22 LAB — COMPREHENSIVE METABOLIC PANEL
ALT: 10 U/L (ref 0–44)
AST: 12 U/L — ABNORMAL LOW (ref 15–41)
Albumin: 2.8 g/dL — ABNORMAL LOW (ref 3.5–5.0)
Alkaline Phosphatase: 62 U/L (ref 38–126)
Anion gap: 8 (ref 5–15)
BUN: 19 mg/dL (ref 8–23)
CO2: 31 mmol/L (ref 22–32)
Calcium: 8.7 mg/dL — ABNORMAL LOW (ref 8.9–10.3)
Chloride: 101 mmol/L (ref 98–111)
Creatinine, Ser: 1.02 mg/dL (ref 0.61–1.24)
GFR, Estimated: >60 mL/min (ref >60)
Glucose, Bld: 209 mg/dL — ABNORMAL HIGH (ref 70–99)
Potassium: 3.4 mmol/L — ABNORMAL LOW (ref 3.5–5.1)
Sodium: 140 mmol/L (ref 135–145)
Total Bilirubin: 0.8 mg/dL (ref 0.3–1.2)
Total Protein: 6.5 g/dL (ref 6.5–8.1)

## 2020-11-22 LAB — CBC
HCT: 27.8 % — ABNORMAL LOW (ref 39.0–52.0)
Hemoglobin: 9.2 g/dL — ABNORMAL LOW (ref 13.0–17.0)
MCH: 31 pg (ref 26.0–34.0)
MCHC: 33.1 g/dL (ref 30.0–36.0)
MCV: 93.6 fL (ref 80.0–100.0)
Platelets: 350 10*3/uL (ref 150–400)
RBC: 2.97 MIL/uL — ABNORMAL LOW (ref 4.22–5.81)
RDW: 15.1 % (ref 11.5–15.5)
WBC: 5.8 10*3/uL (ref 4.0–10.5)
nRBC: 0 % (ref 0.0–0.2)

## 2020-11-22 LAB — GLUCOSE, CAPILLARY: Glucose-Capillary: 202 mg/dL — ABNORMAL HIGH (ref 70–99)

## 2020-11-22 MED ORDER — FLUTICASONE PROPIONATE 50 MCG/ACT NA SUSP
1.0000 | Freq: Every day | NASAL | Status: DC
  Administered 2020-11-22 – 2020-11-24 (×3): 1 via NASAL
  Filled 2020-11-22: qty 16

## 2020-11-22 MED ORDER — IPRATROPIUM-ALBUTEROL 0.5-2.5 (3) MG/3ML IN SOLN
3.0000 mL | Freq: Three times a day (TID) | RESPIRATORY_TRACT | Status: DC
  Administered 2020-11-22 (×2): 3 mL via RESPIRATORY_TRACT
  Filled 2020-11-22 (×4): qty 3

## 2020-11-22 MED ORDER — FUROSEMIDE 10 MG/ML IJ SOLN: 40.0000 mg | Freq: Every day | INTRAMUSCULAR | Status: DC

## 2020-11-22 MED ORDER — DICLOFENAC SODIUM 1 % EX GEL
4.0000 g | Freq: Four times a day (QID) | CUTANEOUS | Status: DC
  Administered 2020-11-22 – 2020-11-24 (×9): 4 g via TOPICAL
  Filled 2020-11-22: qty 100

## 2020-11-22 MED ORDER — POTASSIUM CHLORIDE CRYS ER 20 MEQ PO TBCR
40.0000 meq | EXTENDED_RELEASE_TABLET | Freq: Once | ORAL | Status: AC
  Administered 2020-11-22: 40 meq via ORAL
  Filled 2020-11-22: qty 2

## 2020-11-22 MED ORDER — INFLUENZA VAC A&B SA ADJ QUAD 0.5 ML IM PRSY
0.5000 mL | PREFILLED_SYRINGE | INTRAMUSCULAR | Status: AC
  Administered 2020-11-23: 09:00:00 0.5 mL via INTRAMUSCULAR
  Filled 2020-11-22: qty 0.5

## 2020-11-22 MED ORDER — BUDESONIDE 0.5 MG/2ML IN SUSP
0.5000 mg | Freq: Two times a day (BID) | RESPIRATORY_TRACT | Status: DC
  Administered 2020-11-22 – 2020-11-25 (×7): 0.5 mg via RESPIRATORY_TRACT
  Filled 2020-11-22 (×8): qty 2

## 2020-11-22 MED ORDER — IPRATROPIUM-ALBUTEROL 0.5-2.5 (3) MG/3ML IN SOLN
3.0000 mL | RESPIRATORY_TRACT | Status: DC
  Administered 2020-11-22 – 2020-11-25 (×17): 3 mL via RESPIRATORY_TRACT
  Filled 2020-11-22 (×16): qty 3

## 2020-11-22 MED ORDER — FUROSEMIDE 20 MG PO TABS
20.0000 mg | ORAL_TABLET | Freq: Every day | ORAL | Status: DC
  Administered 2020-11-22: 20 mg via ORAL
  Filled 2020-11-22: qty 1

## 2020-11-22 MED ORDER — ENSURE ENLIVE PO LIQD
237.0000 mL | Freq: Two times a day (BID) | ORAL | Status: DC
  Administered 2020-11-22 – 2020-11-25 (×4): 237 mL via ORAL

## 2020-11-22 MED ORDER — ALBUTEROL SULFATE HFA 108 (90 BASE) MCG/ACT IN AERS: 2.0000 | INHALATION_SPRAY | RESPIRATORY_TRACT | Status: DC | PRN

## 2020-11-22 MED ORDER — FUROSEMIDE 10 MG/ML IJ SOLN
40.0000 mg | Freq: Every day | INTRAMUSCULAR | Status: DC
  Administered 2020-11-23 – 2020-11-24 (×2): 40 mg via INTRAVENOUS
  Filled 2020-11-22 (×2): qty 4

## 2020-11-22 MED ORDER — METHOCARBAMOL 750 MG PO TABS
750.0000 mg | ORAL_TABLET | Freq: Three times a day (TID) | ORAL | Status: DC | PRN
  Administered 2020-11-22: 750 mg via ORAL
  Filled 2020-11-22 (×2): qty 1

## 2020-11-22 MED ORDER — PANTOPRAZOLE SODIUM 40 MG PO TBEC
40.0000 mg | DELAYED_RELEASE_TABLET | Freq: Every day | ORAL | Status: DC
  Administered 2020-11-22 – 2020-11-25 (×4): 40 mg via ORAL
  Filled 2020-11-22 (×4): qty 1

## 2020-11-22 MED ORDER — LORATADINE 10 MG PO TABS
10.0000 mg | ORAL_TABLET | Freq: Every day | ORAL | Status: DC
  Administered 2020-11-22 – 2020-11-25 (×4): 10 mg via ORAL
  Filled 2020-11-22 (×4): qty 1

## 2020-11-22 MED ORDER — NEBIVOLOL HCL 2.5 MG PO TABS
2.5000 mg | ORAL_TABLET | Freq: Every day | ORAL | Status: DC
  Administered 2020-11-22 – 2020-11-25 (×4): 2.5 mg via ORAL
  Filled 2020-11-22 (×5): qty 1

## 2020-11-22 MED ORDER — PNEUMOCOCCAL VAC POLYVALENT 25 MCG/0.5ML IJ INJ
0.5000 mL | INJECTION | INTRAMUSCULAR | Status: AC
  Administered 2020-11-25: 09:00:00 0.5 mL via INTRAMUSCULAR
  Filled 2020-11-22 (×2): qty 0.5

## 2020-11-22 MED ORDER — SENNA 8.6 MG PO TABS
1.0000 | ORAL_TABLET | Freq: Every day | ORAL | Status: DC
  Filled 2020-11-22 (×2): qty 1

## 2020-11-22 MED ORDER — ALBUTEROL SULFATE (2.5 MG/3ML) 0.083% IN NEBU: 2.5000 mg | INHALATION_SOLUTION | RESPIRATORY_TRACT | Status: DC | PRN

## 2020-11-22 NOTE — Progress Notes (Signed)
Mobility Specialist - Progress Note   11/22/20 1500  Mobility  Activity Dangled on edge of bed;Stood at bedside  Range of Motion/Exercises Right leg;Left leg  Level of Assistance Standby assist, set-up cues, supervision of patient - no hands on  Assistive Device None  Distance Ambulated (ft) 0 ft  Mobility Response Tolerated well  Mobility performed by Mobility specialist  $Mobility charge 1 Mobility    Pre-mobility: 89 HR, 95% SpO2 During mobility: 98 HR, 81-93% SpO2 Post-mobility: 93 HR, 94% SpO2   Pt lying in bed upon arrival, utilizing 4.5L. Pt politely declined ambulation this date, but agreeable to light activity EOB with min encouragement. ModI for bed mobility. Pt stood at bedside x2 with sats dropping to 86% first trial---rest break taken to retrieve sats > 92%. On second standing trial pt progressed to marching in place for 15 seconds with sats increasing during activity then desatting to a low of 81% once seated. Approximately 30-45 second recovery > 88% for both standing trials. No s/s of distress. RPE 2/10. Pt left in bed with alarm set. RN notified of performance.    Filiberto Pinks Mobility Specialist 11/22/20, 3:18 PM

## 2020-11-22 NOTE — ED Notes (Signed)
Pt placed on bedpan at this time. Pt verbalizes understanding to call when finished on bedpan. Call light within reach.

## 2020-11-22 NOTE — Progress Notes (Signed)
PROGRESS NOTE    Charles Webster   FAO:[id number]  DOB: [birth year]  PCP: Marina Goodell, MD    DOA: 11/21/2020 LOS: 1    Brief Narrative / Hospital Course to Date:   Per H&P: "Charles Webster is a 75 y.o. male with medical history significant of COPD, GERD, diverticulosis, who has advanced COPD and followed by pulmonary presenting with shortness of breath cough and wheezing.  Patient has been having significant symptoms and has been on oxygen at 5 L at home.  He has been using his nebulizer treatment 3 times to 4 times a day.  He has not been up to every 4 hours a day now but not getting better.  No chest pain.  Was seen in the ER.  Requiring 6 to 7 L on arrival.  Mild expiratory wheezing.  Patient being admitted to the hospital therefore with acute exacerbation of COPD.Marland Kitchen   ED Course: Temperature is 98.4, blood pressure is 93/66, pulse 100, respirate of 25 and oxygen sats 94% on 6 L.  White count is 11.2 hemoglobin 9.1 platelets 374.  Chest x-ray showed emphysema with increased interstitial and hazy bilateral lung opacity suspicious for superimposed edema or infection.  COVID-19 is negative.  Patient is being admitted for further evaluation and treatment of acute exacerbation of COPD"  Assessment & Plan   Principal Problem:   Acute on chronic respiratory failure with hypoxia and hypercapnia (HCC) Active Problems:   COPD exacerbation (HCC)   Anemia   GERD (gastroesophageal reflux disease)    Acute on chronic respiratory failure with hypoxia due to COPD with acute exacerbation and Acute on chronic diastolic CHF - patient has severe dyspnea with minimal exertion.  Very poor aeration on exam.  Chest x-ray with possibly interstitial edema, has mild lower extremity edema on exam.   Baseline oxygen requirement is recently 5 L/min. Patient follows with pulmonology, Dr. Belia Heman. Was on chronic prednisone, reports being stopped and not doing well since. Echo 09/11/2020 showed preserved EF 55 to  60%, grade 1 diastolic dysfunction. 10/25: Improved but still very dyspneic with even bed mobility --Continue IV steroids, Zithromax -- Scheduled DuoNebs and as needed albuterol nebs --Pulmicort nebs twice daily --Daily weights and I/O's to monitor volume status --Continued on home Lasix on admission, will hold --Lasix 40 mg IV daily  -- Supplement oxygen to maintain sats above 88% --Incentive spirometer --Hold home nebivolol for now with COPD exacerbation  Hypokalemia -replacing.  Monitor and replace as needed.  History of oral thrush -monitor and resume nystatin suspension if needed  Low back pain -without radicular or other red flag symptoms.  Reports it feels muscular and different from his typical back pain.  Had a muscle spasm while getting on or off the EMS stretcher. --Trial of Robaxin --Voltaren gel  History of anxiety -patient reports taking himself off of his medication for this.  Appears this was Paxil.  Will not resume.  Patient prefers not to use medication for this.  GERD -continue PPI  Allergic rhinitis -loratadine sent for home cetirizine  Anemia of chronic disease -hemoglobin stable.  Monitor CBC     Patient BMI: Body mass index is 18.24 kg/m.   DVT prophylaxis: enoxaparin (LOVENOX) injection 40 mg Start: 11/22/20 0800   Diet:  Diet Orders (From admission, onward)     Start     Ordered   11/21/20 2350  Diet Heart Room service appropriate? Yes; Fluid consistency: Thin  Diet effective now  Question Answer Comment  Room service appropriate? Yes   Fluid consistency: Thin      11/21/20 2349              Code Status: DNR   Subjective 11/22/20    Patient seen this morning at bedside reports feeling a little better.  He got extremely dyspneic with most minimal exertion at home.  He could not even make it to the car when his family was going to bring him to the ER.  Reporting some low back pain that typically ibuprofen helps with but recently  feels more muscular and had muscle spasms with EMS.  Reports taking himself off of his anxiety medication due to side effects and these gotten better.  He was having stomach problems and vision changes and he said these resolved.  Denies fevers or chills.    Disposition Plan & Communication   Status is: Inpatient  Remains inpatient appropriate because: On IV therapies and severity of illness as outlined above  Anticipate discharge home when improved and resuming home health which is currently ongoing prior to admission.   Consults, Procedures, Significant Events   Consultants:  None  Procedures:  None  Antimicrobials:  Anti-infectives (From admission, onward)    Start     Dose/Rate Route Frequency Ordered Stop   11/22/20 0000  azithromycin (ZITHROMAX) 500 mg in sodium chloride 0.9 % 250 mL IVPB        500 mg 250 mL/hr over 60 Minutes Intravenous Every 24 hours 11/21/20 2349           Micro    Objective   Vitals:   11/22/20 0300 11/22/20 0400 11/22/20 0500 11/22/20 0700  BP: 93/67 98/69 111/65 103/71  Pulse: 73 75 76 79  Resp: 16 19 18 20   Temp:      TempSrc:      SpO2: 98% 100% 99% 100%  Weight:      Height:        Intake/Output Summary (Last 24 hours) at 11/22/2020 0815 Last data filed at 11/21/2020 2311 Gross per 24 hour  Intake --  Output 1300 ml  Net -1300 ml   Filed Weights   11/21/20 1515  Weight: 51.3 kg    Physical Exam:  General exam: awake, alert, no acute distress, frail HEENT: atraumatic, clear conjunctiva, anicteric sclera, moist mucus membranes, hearing grossly normal  Respiratory system: Severely diminished aeration, no wheezes heard, mildly increased respiratory effort, on 5 L/min supplemental oxygen. Cardiovascular system: normal S1/S2, RRR, 1+ lower extremity bilateral pitting edema.   Gastrointestinal system: soft, NT, ND, no HSM felt, +bowel sounds. Central nervous system: A&O x4. no gross focal neurologic deficits, normal  speech Skin: dry, intact, normal temperature, normal color, No rashes, lesions or ulcers seen on visualized skin Psychiatry: normal mood, congruent affect, judgement and insight appear normal  Labs   Data Reviewed: I have personally reviewed following labs and imaging studies  CBC: Recent Labs  Lab 11/21/20 1518 11/22/20 0022  WBC 11.2* 5.8  HGB 9.1* 9.2*  HCT 27.9* 27.8*  MCV 93.3 93.6  PLT 374 350   Basic Metabolic Panel: Recent Labs  Lab 11/21/20 1518 11/22/20 0022  NA 142 140  K 4.1 3.4*  CL 104 101  CO2 31 31  GLUCOSE 108* 209*  BUN 22 19  CREATININE 1.01 1.02  CALCIUM 9.0 8.7*   GFR: Estimated Creatinine Clearance: 45.4 mL/min (by C-G formula based on SCr of 1.02 mg/dL). Liver Function Tests: Recent Labs  Lab 11/22/20 0022  AST 12*  ALT 10  ALKPHOS 62  BILITOT 0.8  PROT 6.5  ALBUMIN 2.8*   No results for input(s): LIPASE, AMYLASE in the last 168 hours. No results for input(s): AMMONIA in the last 168 hours. Coagulation Profile: No results for input(s): INR, PROTIME in the last 168 hours. Cardiac Enzymes: No results for input(s): CKTOTAL, CKMB, CKMBINDEX, TROPONINI in the last 168 hours. BNP (last 3 results) No results for input(s): PROBNP in the last 8760 hours. HbA1C: No results for input(s): HGBA1C in the last 72 hours. CBG: No results for input(s): GLUCAP in the last 168 hours. Lipid Profile: No results for input(s): CHOL, HDL, LDLCALC, TRIG, CHOLHDL, LDLDIRECT in the last 72 hours. Thyroid Function Tests: No results for input(s): TSH, T4TOTAL, FREET4, T3FREE, THYROIDAB in the last 72 hours. Anemia Panel: No results for input(s): VITAMINB12, FOLATE, FERRITIN, TIBC, IRON, RETICCTPCT in the last 72 hours. Sepsis Labs: Recent Labs  Lab 11/21/20 1725  PROCALCITON <0.10    Recent Results (from the past 240 hour(s))  Resp Panel by RT-PCR (Flu A&B, Covid) Nasopharyngeal Swab     Status: None   Collection Time: 11/21/20  4:05 PM   Specimen:  Nasopharyngeal Swab; Nasopharyngeal(NP) swabs in vial transport medium  Result Value Ref Range Status   SARS Coronavirus 2 by RT PCR NEGATIVE NEGATIVE Final    Comment: (NOTE) SARS-CoV-2 target nucleic acids are NOT DETECTED.  The SARS-CoV-2 RNA is generally detectable in upper respiratory specimens during the acute phase of infection. The lowest concentration of SARS-CoV-2 viral copies this assay can detect is 138 copies/mL. A negative result does not preclude SARS-Cov-2 infection and should not be used as the sole basis for treatment or other patient management decisions. A negative result may occur with  improper specimen collection/handling, submission of specimen other than nasopharyngeal swab, presence of viral mutation(s) within the areas targeted by this assay, and inadequate number of viral copies(<138 copies/mL). A negative result must be combined with clinical observations, patient history, and epidemiological information. The expected result is Negative.  Fact Sheet for Patients:  BloggerCourse.com  Fact Sheet for Healthcare Providers:  SeriousBroker.it  This test is no t yet approved or cleared by the [place] FDA and  has been authorized for detection and/or diagnosis of SARS-CoV-2 by FDA under an Emergency Use Authorization (EUA). This EUA will remain  in effect (meaning this test can be used) for the duration of the COVID-19 declaration under Section 564(b)(1) of the Act, 21 U.S.C.section 360bbb-3(b)(1), unless the authorization is terminated  or revoked sooner.       Influenza A by PCR NEGATIVE NEGATIVE Final   Influenza B by PCR NEGATIVE NEGATIVE Final    Comment: (NOTE) The Xpert Xpress SARS-CoV-2/FLU/RSV plus assay is intended as an aid in the diagnosis of influenza from Nasopharyngeal swab specimens and should not be used as a sole basis for treatment. Nasal washings and aspirates are unacceptable for  Xpert Xpress SARS-CoV-2/FLU/RSV testing.  Fact Sheet for Patients: BloggerCourse.com  Fact Sheet for Healthcare Providers: SeriousBroker.it  This test is not yet approved or cleared by the [place] FDA and has been authorized for detection and/or diagnosis of SARS-CoV-2 by FDA under an Emergency Use Authorization (EUA). This EUA will remain in effect (meaning this test can be used) for the duration of the COVID-19 declaration under Section 564(b)(1) of the Act, 21 U.S.C. section 360bbb-3(b)(1), unless the authorization is terminated or revoked.  Performed at [facility], [address]  [address] [place], [place] [address]       Imaging Studies   DG Chest Port 1 View  Result Date: 11/21/2020 CLINICAL DATA:  Shortness of breath EXAM: PORTABLE CHEST 1 VIEW COMPARISON:  09/19/2020, CT 09/13/2020, 09/12/2020 FINDINGS: Emphysematous disease. Similar chronic postoperative change in the right thorax. Increased opacity within the right lung diffusely and left lung base. Stable cardiomediastinal silhouette with aortic atherosclerosis. No pneumothorax. IMPRESSION: Emphysema and chronic scarring and postsurgical changes on the right. Interim development of increased interstitial and hazy bilateral lung opacity suspicious for superimposed edema or infection Electronically Signed   By: Jasmine Pang M.D.   On: 11/21/2020 16:23     Medications   Scheduled Meds:  arformoterol  15 mcg Nebulization BID   budesonide  0.5 mg Nebulization BID   enoxaparin (LOVENOX) injection  40 mg Subcutaneous Q24H   feeding supplement  237 mL Oral BID BM   furosemide  20 mg Oral Daily   ipratropium-albuterol  3 mL Nebulization Q8H   loratadine  10 mg Oral Daily   methylPREDNISolone (SOLU-MEDROL) injection  40 mg Intravenous Q6H   nebivolol  2.5 mg Oral Daily   pantoprazole  40 mg Oral Daily   Continuous Infusions:  sodium chloride 100 mL/hr at  11/22/20 0037   azithromycin Stopped (11/22/20 0217)       LOS: 1 day    Time spent: 30 minutes    Pennie Banter, DO [facility]  11/22/2020, 8:15 AM      If 7PM-7AM, please contact night-coverage. How to contact the [facility] Attending or Consulting provider 7A - 7P or covering provider during after hours 7P -7A, for this patient?    Check the care team in [facility] and look for a) attending/consulting [facility] provider listed and b) the [facility] team listed Log into [email or web address] and use [employer]'s universal password to access. If you do not have the password, please contact the hospital operator. Locate the [facility] provider you are looking for under [facility] and page to a number that you can be directly reached. If you still have difficulty reaching the provider, please page the [facility] (Director on Call) for the Hospitalists listed on amion for assistance.

## 2020-11-22 NOTE — ED Notes (Signed)
Pt taken off bedpan at this time. Pt repositioned in bed without difficulty. Belongings within reach. Denies further needs at this time.

## 2020-11-23 ENCOUNTER — Encounter: Payer: Self-pay | Admitting: Internal Medicine

## 2020-11-23 DIAGNOSIS — J441 Chronic obstructive pulmonary disease with (acute) exacerbation: Secondary | ICD-10-CM

## 2020-11-23 DIAGNOSIS — J9622 Acute and chronic respiratory failure with hypercapnia: Secondary | ICD-10-CM | POA: Diagnosis not present

## 2020-11-23 LAB — CBC
HCT: 25.7 % — ABNORMAL LOW (ref 39.0–52.0)
Hemoglobin: 8.3 g/dL — ABNORMAL LOW (ref 13.0–17.0)
MCH: 29.5 pg (ref 26.0–34.0)
MCHC: 32.3 g/dL (ref 30.0–36.0)
MCV: 91.5 fL (ref 80.0–100.0)
Platelets: 369 10*3/uL (ref 150–400)
RBC: 2.81 MIL/uL — ABNORMAL LOW (ref 4.22–5.81)
RDW: 15.2 % (ref 11.5–15.5)
WBC: 14.6 10*3/uL — ABNORMAL HIGH (ref 4.0–10.5)
nRBC: 0 % (ref 0.0–0.2)

## 2020-11-23 LAB — BASIC METABOLIC PANEL
Anion gap: 5 (ref 5–15)
BUN: 24 mg/dL — ABNORMAL HIGH (ref 8–23)
CO2: 29 mmol/L (ref 22–32)
Calcium: 8.7 mg/dL — ABNORMAL LOW (ref 8.9–10.3)
Chloride: 105 mmol/L (ref 98–111)
Creatinine, Ser: 1.03 mg/dL (ref 0.61–1.24)
GFR, Estimated: >60 mL/min (ref >60)
Glucose, Bld: 182 mg/dL — ABNORMAL HIGH (ref 70–99)
Potassium: 3.7 mmol/L (ref 3.5–5.1)
Sodium: 139 mmol/L (ref 135–145)

## 2020-11-23 LAB — MAGNESIUM: Magnesium: 1.9 mg/dL (ref 1.7–2.4)

## 2020-11-23 NOTE — Progress Notes (Signed)
Progress Note    Charles Webster  KYH:[id number] DOB: 08-Apr-1945  DOA: 11/21/2020 PCP: Marina Goodell, MD      Brief Narrative:    Medical records reviewed and are as summarized below:  Charles Webster is a 75 y.o. male with medical history significant for COPD, chronic hypoxemic respiratory failure on 5 L/min oxygen at home, recent COVID-19 infection GERD, diverticulosis, who presented to the hospital with shortness of breath, cough and wheezing.      Assessment/Plan:   Principal Problem:   Acute on chronic respiratory failure with hypoxia and hypercapnia (HCC) Active Problems:   COPD exacerbation (HCC)   Anemia   GERD (gastroesophageal reflux disease)    Body mass index is 19.54 kg/m.   Acute on chronic diastolic CHF: Continue IV Lasix.  Monitor BMP, daily weight and urine output.  COPD exacerbation: Continue IV steroids, antibiotics and bronchodilators  Chronic hypoxemic respiratory failure: Currently on 5 L/min oxygen via nasal cannula.  Hypokalemia: Improved  Leukocytosis: Worsening WBC is probably due to steroids.  Monitor CBC.  Other comorbidities include anxiety, GERD, allergic rhinitis, anemia of chronic disease, low back pain  Encouraged ambulation.  He said he was very short of breath when he got up to the side of the bed yesterday.  He wants to try again today.  Diet Order             Diet Heart Room service appropriate? Yes; Fluid consistency: Thin  Diet effective now                      Consultants: None  Procedures: None    Medications:    budesonide  0.5 mg Nebulization BID   diclofenac Sodium  4 g Topical QID   enoxaparin (LOVENOX) injection  40 mg Subcutaneous Q24H   feeding supplement  237 mL Oral BID BM   fluticasone  1 spray Each Nare Daily   furosemide  40 mg Intravenous Daily   ipratropium-albuterol  3 mL Nebulization Q4H   loratadine  10 mg Oral Daily   methylPREDNISolone (SOLU-MEDROL) injection  40 mg  Intravenous Q6H   nebivolol  2.5 mg Oral Daily   pantoprazole  40 mg Oral Daily   pneumococcal 23 valent vaccine  0.5 mL Intramuscular Tomorrow-1000   senna  1 tablet Oral QHS   Continuous Infusions:  azithromycin Stopped (11/23/20 0836)     Anti-infectives (From admission, onward)    Start     Dose/Rate Route Frequency Ordered Stop   11/22/20 0000  azithromycin (ZITHROMAX) 500 mg in sodium chloride 0.9 % 250 mL IVPB        500 mg 250 mL/hr over 60 Minutes Intravenous Every 24 hours 11/21/20 2349                Family Communication/Anticipated D/C date and plan/Code Status   DVT prophylaxis: enoxaparin (LOVENOX) injection 40 mg Start: 11/22/20 0800     Code Status: DNR  Family Communication: None Disposition Plan: Possible discharge to home in 1 to 2 days   Status is: Inpatient  Remains inpatient appropriate because: IV Lasix, shortness of breath           Subjective:   Interval events noted.  He complains of shortness of breath with minimal exertion.  Objective:    Vitals:   11/23/20 0713 11/23/20 0809 11/23/20 1237 11/23/20 1502  BP:  121/63 120/70   Pulse:  94 96   Resp:  18 (!) 21   Temp:  98.3 F (36.8 C) (!) 97.5 F (36.4 C)   TempSrc:  Oral Oral   SpO2: 99% 96% 94% 93%  Weight:      Height:       No data found.   Intake/Output Summary (Last 24 hours) at 11/23/2020 1610 Last data filed at 11/23/2020 1416 Gross per 24 hour  Intake 240 ml  Output 1650 ml  Net -1410 ml   Filed Weights   11/21/20 1515 11/23/20 0500  Weight: 51.3 kg 54.9 kg    Exam:  GEN: NAD SKIN: No rash EYES: EOMI ENT: MMM CV: RRR PULM: Bibasilar rales.  No wheezing.   ABD: soft, ND, NT, +BS CNS: AAO x 3, non focal EXT: Bilateral leg edema or tenderness        Data Reviewed:   I have personally reviewed following labs and imaging studies:  Labs: Labs show the following:   Basic Metabolic Panel: Recent Labs  Lab 11/21/20 1518  11/22/20 0022 11/23/20 0322  NA 142 140 139  K 4.1 3.4* 3.7  CL 104 101 105  CO2 31 31 29   GLUCOSE 108* 209* 182*  BUN 22 19 24*  CREATININE 1.01 1.02 1.03  CALCIUM 9.0 8.7* 8.7*  MG  --   --  1.9   GFR Estimated Creatinine Clearance: 48.1 mL/min (by C-G formula based on SCr of 1.03 mg/dL). Liver Function Tests: Recent Labs  Lab 11/22/20 0022  AST 12*  ALT 10  ALKPHOS 62  BILITOT 0.8  PROT 6.5  ALBUMIN 2.8*   No results for input(s): LIPASE, AMYLASE in the last 168 hours. No results for input(s): AMMONIA in the last 168 hours. Coagulation profile No results for input(s): INR, PROTIME in the last 168 hours.  CBC: Recent Labs  Lab 11/21/20 1518 11/22/20 0022 11/23/20 0322  WBC 11.2* 5.8 14.6*  HGB 9.1* 9.2* 8.3*  HCT 27.9* 27.8* 25.7*  MCV 93.3 93.6 91.5  PLT 374 350 369   Cardiac Enzymes: No results for input(s): CKTOTAL, CKMB, CKMBINDEX, TROPONINI in the last 168 hours. BNP (last 3 results) No results for input(s): PROBNP in the last 8760 hours. CBG: Recent Labs  Lab 11/22/20 1359  GLUCAP 202*   D-Dimer: No results for input(s): DDIMER in the last 72 hours. Hgb A1c: No results for input(s): HGBA1C in the last 72 hours. Lipid Profile: No results for input(s): CHOL, HDL, LDLCALC, TRIG, CHOLHDL, LDLDIRECT in the last 72 hours. Thyroid function studies: No results for input(s): TSH, T4TOTAL, T3FREE, THYROIDAB in the last 72 hours.  Invalid input(s): FREET3 Anemia work up: No results for input(s): VITAMINB12, FOLATE, FERRITIN, TIBC, IRON, RETICCTPCT in the last 72 hours. Sepsis Labs: Recent Labs  Lab 11/21/20 1518 11/21/20 1725 11/22/20 0022 11/23/20 0322  PROCALCITON  --  <0.10  --   --   WBC 11.2*  --  5.8 14.6*    Microbiology Recent Results (from the past 240 hour(s))  Resp Panel by RT-PCR (Flu A&B, Covid) Nasopharyngeal Swab     Status: None   Collection Time: 11/21/20  4:05 PM   Specimen: Nasopharyngeal Swab; Nasopharyngeal(NP) swabs in  vial transport medium  Result Value Ref Range Status   SARS Coronavirus 2 by RT PCR NEGATIVE NEGATIVE Final    Comment: (NOTE) SARS-CoV-2 target nucleic acids are NOT DETECTED.  The SARS-CoV-2 RNA is generally detectable in upper respiratory specimens during the acute phase of infection. The lowest concentration of SARS-CoV-2 viral copies this assay  can detect is 138 copies/mL. A negative result does not preclude SARS-Cov-2 infection and should not be used as the sole basis for treatment or other patient management decisions. A negative result may occur with  improper specimen collection/handling, submission of specimen other than nasopharyngeal swab, presence of viral mutation(s) within the areas targeted by this assay, and inadequate number of viral copies(<138 copies/mL). A negative result must be combined with clinical observations, patient history, and epidemiological information. The expected result is Negative.  Fact Sheet for Patients:  BloggerCourse.com  Fact Sheet for Healthcare Providers:  SeriousBroker.it  This test is no t yet approved or cleared by the [place] FDA and  has been authorized for detection and/or diagnosis of SARS-CoV-2 by FDA under an Emergency Use Authorization (EUA). This EUA will remain  in effect (meaning this test can be used) for the duration of the COVID-19 declaration under Section 564(b)(1) of the Act, 21 U.S.C.section 360bbb-3(b)(1), unless the authorization is terminated  or revoked sooner.       Influenza A by PCR NEGATIVE NEGATIVE Final   Influenza B by PCR NEGATIVE NEGATIVE Final    Comment: (NOTE) The Xpert Xpress SARS-CoV-2/FLU/RSV plus assay is intended as an aid in the diagnosis of influenza from Nasopharyngeal swab specimens and should not be used as a sole basis for treatment. Nasal washings and aspirates are unacceptable for Xpert Xpress SARS-CoV-2/FLU/RSV testing.  Fact  Sheet for Patients: BloggerCourse.com  Fact Sheet for Healthcare Providers: SeriousBroker.it  This test is not yet approved or cleared by the [place] FDA and has been authorized for detection and/or diagnosis of SARS-CoV-2 by FDA under an Emergency Use Authorization (EUA). This EUA will remain in effect (meaning this test can be used) for the duration of the COVID-19 declaration under Section 564(b)(1) of the Act, 21 U.S.C. section 360bbb-3(b)(1), unless the authorization is terminated or revoked.  Performed at [facility], [address] [place], [place] [address]     Procedures and diagnostic studies:  No results found.             LOS: 2 days   Majd Tissue  [facility]   Pager on [email or web address]. If 7PM-7AM, please contact night-coverage at [email or web address]     11/23/2020, 4:10 PM

## 2020-11-24 DIAGNOSIS — J441 Chronic obstructive pulmonary disease with (acute) exacerbation: Secondary | ICD-10-CM | POA: Diagnosis not present

## 2020-11-24 DIAGNOSIS — J9622 Acute and chronic respiratory failure with hypercapnia: Secondary | ICD-10-CM | POA: Diagnosis not present

## 2020-11-24 MED ORDER — PREDNISONE 20 MG PO TABS
40.0000 mg | ORAL_TABLET | Freq: Every day | ORAL | Status: DC
  Administered 2020-11-25: 09:00:00 40 mg via ORAL
  Filled 2020-11-24: qty 2

## 2020-11-24 MED ORDER — FUROSEMIDE 10 MG/ML IJ SOLN
20.0000 mg | Freq: Once | INTRAMUSCULAR | Status: AC
  Administered 2020-11-24: 20 mg via INTRAVENOUS
  Filled 2020-11-24: qty 2

## 2020-11-24 MED ORDER — FUROSEMIDE 10 MG/ML IJ SOLN
60.0000 mg | Freq: Two times a day (BID) | INTRAMUSCULAR | Status: AC
  Administered 2020-11-24 – 2020-11-25 (×2): 60 mg via INTRAVENOUS
  Filled 2020-11-24 (×2): qty 6

## 2020-11-24 MED ORDER — POTASSIUM CHLORIDE CRYS ER 20 MEQ PO TBCR
40.0000 meq | EXTENDED_RELEASE_TABLET | Freq: Once | ORAL | Status: AC
  Administered 2020-11-24: 40 meq via ORAL
  Filled 2020-11-24: qty 2

## 2020-11-24 NOTE — Progress Notes (Signed)
Mobility Specialist - Progress Note   11/24/20 1300  Mobility  Activity Ambulated in room;Stood at bedside  Level of Assistance Standby assist, set-up cues, supervision of patient - no hands on  Assistive Device None  Distance Ambulated (ft) 15 ft  Mobility Ambulated with assistance in room  Mobility Response Tolerated well  Mobility performed by Mobility specialist  $Mobility charge 1 Mobility    Pre-mobility: 89 HR, 94% SpO2 During mobility: 94 HR, 87-90% SpO2 Post-mobility: 92 HR, 94% SpO2  Pt lying in bed upon arrival, utilizing 5L. Pt agreeable only to ambulation at bedside. ModI for bed mobility. Ambulated fwd/retro/lateral with supervision, pt even went on to perform mini squats and marching. Reports feeling stronger in LE with some improvement in breathing this date. O2 maintained upper 80s-low 90s with activity, and desat to 81-84% once seated. Fair carryover on PLB technique---voices still mouth-breathing only at times. Pt left in bed with needs in reach.    Charles Webster Mobility Specialist 11/24/20, 1:07 PM

## 2020-11-24 NOTE — Progress Notes (Addendum)
Progress Note    Charles Webster  FGH:[id number] DOB: 09-02-1945  DOA: 11/21/2020 PCP: Marina Goodell, MD      Brief Narrative:    Medical records reviewed and are as summarized below:  Charles Webster is a 75 y.o. male with medical history significant for COPD, chronic hypoxemic respiratory failure on 5 L/min oxygen at home, recent COVID-19 infection GERD, diverticulosis, who presented to the hospital with shortness of breath, cough and wheezing.      Assessment/Plan:   Principal Problem:   Acute on chronic respiratory failure with hypoxia and hypercapnia (HCC) Active Problems:   COPD exacerbation (HCC)   Anemia   GERD (gastroesophageal reflux disease)    Body mass index is 19 kg/m.   Acute on chronic diastolic CHF: Increase IV Lasix from 40 mg twice daily to 60 mg twice daily.  Monitor BMP, daily weight and urine output.  2D echo in August 2022 showed estimated EF 55 to 60%, grade 1 diastolic dysfunction  COPD exacerbation: Change IV Solu-Medrol to oral prednisone.  Continue bronchodilators.  Discontinue antibiotics.    Chronic hypoxemic respiratory failure: He is on 5 L/min oxygen via nasal cannula which is his baseline.    Hypokalemia: Improved  Leukocytosis: Leukocytosis is likely due to steroids.  Monitor CBC.  Other comorbidities include anxiety, GERD, allergic rhinitis, anemia of chronic disease, low back pain     Diet Order             Diet Heart Room service appropriate? Yes; Fluid consistency: Thin  Diet effective now                      Consultants: None  Procedures: None    Medications:    budesonide  0.5 mg Nebulization BID   diclofenac Sodium  4 g Topical QID   enoxaparin (LOVENOX) injection  40 mg Subcutaneous Q24H   feeding supplement  237 mL Oral BID BM   fluticasone  1 spray Each Nare Daily   furosemide  20 mg Intravenous Once   furosemide  60 mg Intravenous BID   ipratropium-albuterol  3 mL Nebulization Q4H    loratadine  10 mg Oral Daily   nebivolol  2.5 mg Oral Daily   pantoprazole  40 mg Oral Daily   pneumococcal 23 valent vaccine  0.5 mL Intramuscular Tomorrow-1000   potassium chloride  40 mEq Oral Once   [START ON 11/25/2020] predniSONE  40 mg Oral Q breakfast   senna  1 tablet Oral QHS   Continuous Infusions:     Anti-infectives (From admission, onward)    Start     Dose/Rate Route Frequency Ordered Stop   11/22/20 0000  azithromycin (ZITHROMAX) 500 mg in sodium chloride 0.9 % 250 mL IVPB  Status:  Discontinued        500 mg 250 mL/hr over 60 Minutes Intravenous Every 24 hours 11/21/20 2349 11/24/20 1204              Family Communication/Anticipated D/C date and plan/Code Status   DVT prophylaxis: enoxaparin (LOVENOX) injection 40 mg Start: 11/22/20 0800     Code Status: DNR  Family Communication: None Disposition Plan: Possible discharge to home tomorrow   Status is: Inpatient  Remains inpatient appropriate because: IV Lasix, shortness of breath           Subjective:   He complains of leg swelling.  Breathing is a little better.  He was able to stand  at the edge of his bed without significant shortness of breath.  Objective:    Vitals:   11/24/20 0731 11/24/20 0759 11/24/20 1110 11/24/20 1147  BP:  125/76  114/66  Pulse:  90  80  Resp:  18  19  Temp:  (!) 97.4 F (36.3 C)  97.8 F (36.6 C)  TempSrc:    Oral  SpO2: 95% 99% 100% 100%  Weight:      Height:       No data found.   Intake/Output Summary (Last 24 hours) at 11/24/2020 1205 Last data filed at 11/24/2020 1100 Gross per 24 hour  Intake 480 ml  Output 1402 ml  Net -922 ml   Filed Weights   11/21/20 1515 11/23/20 0500 11/24/20 0500  Weight: 51.3 kg 54.9 kg 53.4 kg    Exam:  GEN: NAD SKIN: Warm and dry EYES: EOMI ENT: MMM CV: RRR PULM: Bibasilar rales. No wheezing ABD: soft, ND, NT, +BS CNS: AAO x 3, non focal EXT: B/l leg edema, no tenderness        Data  Reviewed:   I have personally reviewed following labs and imaging studies:  Labs: Labs show the following:   Basic Metabolic Panel: Recent Labs  Lab 11/21/20 1518 11/22/20 0022 11/23/20 0322  NA 142 140 139  K 4.1 3.4* 3.7  CL 104 101 105  CO2 31 31 29   GLUCOSE 108* 209* 182*  BUN 22 19 24*  CREATININE 1.01 1.02 1.03  CALCIUM 9.0 8.7* 8.7*  MG  --   --  1.9   GFR Estimated Creatinine Clearance: 46.8 mL/min (by C-G formula based on SCr of 1.03 mg/dL). Liver Function Tests: Recent Labs  Lab 11/22/20 0022  AST 12*  ALT 10  ALKPHOS 62  BILITOT 0.8  PROT 6.5  ALBUMIN 2.8*   No results for input(s): LIPASE, AMYLASE in the last 168 hours. No results for input(s): AMMONIA in the last 168 hours. Coagulation profile No results for input(s): INR, PROTIME in the last 168 hours.  CBC: Recent Labs  Lab 11/21/20 1518 11/22/20 0022 11/23/20 0322  WBC 11.2* 5.8 14.6*  HGB 9.1* 9.2* 8.3*  HCT 27.9* 27.8* 25.7*  MCV 93.3 93.6 91.5  PLT 374 350 369   Cardiac Enzymes: No results for input(s): CKTOTAL, CKMB, CKMBINDEX, TROPONINI in the last 168 hours. BNP (last 3 results) No results for input(s): PROBNP in the last 8760 hours. CBG: Recent Labs  Lab 11/22/20 1359  GLUCAP 202*   D-Dimer: No results for input(s): DDIMER in the last 72 hours. Hgb A1c: No results for input(s): HGBA1C in the last 72 hours. Lipid Profile: No results for input(s): CHOL, HDL, LDLCALC, TRIG, CHOLHDL, LDLDIRECT in the last 72 hours. Thyroid function studies: No results for input(s): TSH, T4TOTAL, T3FREE, THYROIDAB in the last 72 hours.  Invalid input(s): FREET3 Anemia work up: No results for input(s): VITAMINB12, FOLATE, FERRITIN, TIBC, IRON, RETICCTPCT in the last 72 hours. Sepsis Labs: Recent Labs  Lab 11/21/20 1518 11/21/20 1725 11/22/20 0022 11/23/20 0322  PROCALCITON  --  <0.10  --   --   WBC 11.2*  --  5.8 14.6*    Microbiology Recent Results (from the past 240 hour(s))   Resp Panel by RT-PCR (Flu A&B, Covid) Nasopharyngeal Swab     Status: None   Collection Time: 11/21/20  4:05 PM   Specimen: Nasopharyngeal Swab; Nasopharyngeal(NP) swabs in vial transport medium  Result Value Ref Range Status   SARS Coronavirus 2 by  RT PCR NEGATIVE NEGATIVE Final    Comment: (NOTE) SARS-CoV-2 target nucleic acids are NOT DETECTED.  The SARS-CoV-2 RNA is generally detectable in upper respiratory specimens during the acute phase of infection. The lowest concentration of SARS-CoV-2 viral copies this assay can detect is 138 copies/mL. A negative result does not preclude SARS-Cov-2 infection and should not be used as the sole basis for treatment or other patient management decisions. A negative result may occur with  improper specimen collection/handling, submission of specimen other than nasopharyngeal swab, presence of viral mutation(s) within the areas targeted by this assay, and inadequate number of viral copies(<138 copies/mL). A negative result must be combined with clinical observations, patient history, and epidemiological information. The expected result is Negative.  Fact Sheet for Patients:  BloggerCourse.com  Fact Sheet for Healthcare Providers:  SeriousBroker.it  This test is no t yet approved or cleared by the [place] FDA and  has been authorized for detection and/or diagnosis of SARS-CoV-2 by FDA under an Emergency Use Authorization (EUA). This EUA will remain  in effect (meaning this test can be used) for the duration of the COVID-19 declaration under Section 564(b)(1) of the Act, 21 U.S.C.section 360bbb-3(b)(1), unless the authorization is terminated  or revoked sooner.       Influenza A by PCR NEGATIVE NEGATIVE Final   Influenza B by PCR NEGATIVE NEGATIVE Final    Comment: (NOTE) The Xpert Xpress SARS-CoV-2/FLU/RSV plus assay is intended as an aid in the diagnosis of influenza from  Nasopharyngeal swab specimens and should not be used as a sole basis for treatment. Nasal washings and aspirates are unacceptable for Xpert Xpress SARS-CoV-2/FLU/RSV testing.  Fact Sheet for Patients: BloggerCourse.com  Fact Sheet for Healthcare Providers: SeriousBroker.it  This test is not yet approved or cleared by the [place] FDA and has been authorized for detection and/or diagnosis of SARS-CoV-2 by FDA under an Emergency Use Authorization (EUA). This EUA will remain in effect (meaning this test can be used) for the duration of the COVID-19 declaration under Section 564(b)(1) of the Act, 21 U.S.C. section 360bbb-3(b)(1), unless the authorization is terminated or revoked.  Performed at [facility], [address] [place], [place] [address]     Procedures and diagnostic studies:  No results found.             LOS: 3 days   Lorine Iannaccone  [facility]   Pager on [email or web address]. If 7PM-7AM, please contact night-coverage at [email or web address]     11/24/2020, 12:05 PM

## 2020-11-25 DIAGNOSIS — I5033 Acute on chronic diastolic (congestive) heart failure: Secondary | ICD-10-CM | POA: Diagnosis not present

## 2020-11-25 DIAGNOSIS — J9622 Acute and chronic respiratory failure with hypercapnia: Secondary | ICD-10-CM | POA: Diagnosis not present

## 2020-11-25 DIAGNOSIS — J441 Chronic obstructive pulmonary disease with (acute) exacerbation: Secondary | ICD-10-CM | POA: Diagnosis not present

## 2020-11-25 LAB — BASIC METABOLIC PANEL
Anion gap: 11 (ref 5–15)
BUN: 42 mg/dL — ABNORMAL HIGH (ref 8–23)
CO2: 34 mmol/L — ABNORMAL HIGH (ref 22–32)
Calcium: 9.8 mg/dL (ref 8.9–10.3)
Chloride: 93 mmol/L — ABNORMAL LOW (ref 98–111)
Creatinine, Ser: 1.04 mg/dL (ref 0.61–1.24)
GFR, Estimated: >60 mL/min (ref >60)
Glucose, Bld: 82 mg/dL (ref 70–99)
Potassium: 3.3 mmol/L — ABNORMAL LOW (ref 3.5–5.1)
Sodium: 138 mmol/L (ref 135–145)

## 2020-11-25 LAB — MAGNESIUM: Magnesium: 2 mg/dL (ref 1.7–2.4)

## 2020-11-25 MED ORDER — FUROSEMIDE 40 MG PO TABS: 40.0000 mg | ORAL_TABLET | Freq: Two times a day (BID) | ORAL | 0 refills | Status: AC

## 2020-11-25 MED ORDER — NEBIVOLOL HCL 5 MG PO TABS: 2.5000 mg | ORAL_TABLET | Freq: Every day | ORAL | Status: AC

## 2020-11-25 MED ORDER — POTASSIUM CHLORIDE CRYS ER 10 MEQ PO TBCR: 10.0000 meq | EXTENDED_RELEASE_TABLET | Freq: Every day | ORAL | 0 refills | Status: AC

## 2020-11-25 MED ORDER — FUROSEMIDE 40 MG PO TABS
40.0000 mg | ORAL_TABLET | Freq: Two times a day (BID) | ORAL | Status: DC
  Administered 2020-11-25: 40 mg via ORAL
  Filled 2020-11-25: qty 1

## 2020-11-25 MED ORDER — POTASSIUM CHLORIDE CRYS ER 20 MEQ PO TBCR
40.0000 meq | EXTENDED_RELEASE_TABLET | Freq: Once | ORAL | Status: AC
  Administered 2020-11-25: 15:00:00 40 meq via ORAL
  Filled 2020-11-25: qty 2

## 2020-11-25 MED ORDER — PREDNISONE 20 MG PO TABS: 40.0000 mg | ORAL_TABLET | Freq: Every day | ORAL | 0 refills | Status: AC

## 2020-11-25 NOTE — Discharge Summary (Addendum)
Physician Discharge Summary  Charles Webster VCB:[id number] DOB: [birth year] DOA: 11/21/2020  PCP: Marina Goodell, MD  Admit date: 11/21/2020 Discharge date: 11/25/2020  Discharge disposition: Home   Recommendations for Outpatient Follow-Up:   Follow-up with PCP in 1 week   Discharge Diagnosis:   Principal Problem:   Acute on chronic diastolic CHF (congestive heart failure) (HCC) Active Problems:   COPD exacerbation (HCC)   Acute on chronic respiratory failure with hypoxia and hypercapnia (HCC)   Anemia   GERD (gastroesophageal reflux disease)    Discharge Condition: Stable.  Diet recommendation:  Diet Order             Diet - low sodium heart healthy           Diet Heart Room service appropriate? Yes; Fluid consistency: Thin  Diet effective now                     Code Status: DNR     Hospital Course:   Charles Webster is a 75 y.o. male with medical history significant for COPD, chronic hypoxemic respiratory failure on 5 L/min oxygen at home, recent COVID-19 infection GERD, diverticulosis, who presented to the hospital with shortness of breath, cough and wheezing.  History of complaint of bilateral leg swelling.  He was admitted to the hospital for acute on chronic diastolic CHF and COPD exacerbation.  He was treated with IV Lasix, steroids, antibiotics and bronchodilators.  He developed hypokalemia that was treated as well.  His condition improved and he was able to ambulate without much difficulty.  He is deemed stable for discharge to home today.      Discharge Exam:    Vitals:   11/25/20 0519 11/25/20 0738 11/25/20 0746 11/25/20 1141  BP: 129/85  130/86 125/78  Pulse: 92  86 91  Resp: 16  18 18   Temp: (!) 97.5 F (36.4 C)  97.7 F (36.5 C) 97.9 F (36.6 C)  TempSrc:   Oral Oral  SpO2: 96% 95% 97% 98%  Weight:      Height:         GEN: NAD SKIN: Warm and dry EYES: No pallor or icterus ENT: MMM CV: RRR PULM: No wheezing or  rales heard ABD: soft, ND, NT, +BS CNS: AAO x 3, non focal EXT: Mild/trace bilateral leg edema.  No tenderness or erythema   The results of significant diagnostics from this hospitalization (including imaging, microbiology, ancillary and laboratory) are listed below for reference.     Procedures and Diagnostic Studies:   DG Chest Port 1 View  Result Date: 11/21/2020 CLINICAL DATA:  Shortness of breath EXAM: PORTABLE CHEST 1 VIEW COMPARISON:  09/19/2020, CT 09/13/2020, 09/12/2020 FINDINGS: Emphysematous disease. Similar chronic postoperative change in the right thorax. Increased opacity within the right lung diffusely and left lung base. Stable cardiomediastinal silhouette with aortic atherosclerosis. No pneumothorax. IMPRESSION: Emphysema and chronic scarring and postsurgical changes on the right. Interim development of increased interstitial and hazy bilateral lung opacity suspicious for superimposed edema or infection Electronically Signed   By: Jasmine Pang M.D.   On: 11/21/2020 16:23     Labs:   Basic Metabolic Panel: Recent Labs  Lab 11/21/20 1518 11/22/20 0022 11/23/20 0322 11/25/20 1053  NA 142 140 139 138  K 4.1 3.4* 3.7 3.3*  CL 104 101 105 93*  CO2 31 31 29  34*  GLUCOSE 108* 209* 182* 82  BUN 22 19 24* 42*  CREATININE 1.01 1.02  1.03 1.04  CALCIUM 9.0 8.7* 8.7* 9.8  MG  --   --  1.9 2.0   GFR Estimated Creatinine Clearance: 46.4 mL/min (by C-G formula based on SCr of 1.04 mg/dL). Liver Function Tests: Recent Labs  Lab 11/22/20 0022  AST 12*  ALT 10  ALKPHOS 62  BILITOT 0.8  PROT 6.5  ALBUMIN 2.8*   No results for input(s): LIPASE, AMYLASE in the last 168 hours. No results for input(s): AMMONIA in the last 168 hours. Coagulation profile No results for input(s): INR, PROTIME in the last 168 hours.  CBC: Recent Labs  Lab 11/21/20 1518 11/22/20 0022 11/23/20 0322  WBC 11.2* 5.8 14.6*  HGB 9.1* 9.2* 8.3*  HCT 27.9* 27.8* 25.7*  MCV 93.3 93.6 91.5   PLT 374 350 369   Cardiac Enzymes: No results for input(s): CKTOTAL, CKMB, CKMBINDEX, TROPONINI in the last 168 hours. BNP: Invalid input(s): POCBNP CBG: Recent Labs  Lab 11/22/20 1359  GLUCAP 202*   D-Dimer No results for input(s): DDIMER in the last 72 hours. Hgb A1c No results for input(s): HGBA1C in the last 72 hours. Lipid Profile No results for input(s): CHOL, HDL, LDLCALC, TRIG, CHOLHDL, LDLDIRECT in the last 72 hours. Thyroid function studies No results for input(s): TSH, T4TOTAL, T3FREE, THYROIDAB in the last 72 hours.  Invalid input(s): FREET3 Anemia work up No results for input(s): VITAMINB12, FOLATE, FERRITIN, TIBC, IRON, RETICCTPCT in the last 72 hours. Microbiology Recent Results (from the past 240 hour(s))  Resp Panel by RT-PCR (Flu A&B, Covid) Nasopharyngeal Swab     Status: None   Collection Time: 11/21/20  4:05 PM   Specimen: Nasopharyngeal Swab; Nasopharyngeal(NP) swabs in vial transport medium  Result Value Ref Range Status   SARS Coronavirus 2 by RT PCR NEGATIVE NEGATIVE Final    Comment: (NOTE) SARS-CoV-2 target nucleic acids are NOT DETECTED.  The SARS-CoV-2 RNA is generally detectable in upper respiratory specimens during the acute phase of infection. The lowest concentration of SARS-CoV-2 viral copies this assay can detect is 138 copies/mL. A negative result does not preclude SARS-Cov-2 infection and should not be used as the sole basis for treatment or other patient management decisions. A negative result may occur with  improper specimen collection/handling, submission of specimen other than nasopharyngeal swab, presence of viral mutation(s) within the areas targeted by this assay, and inadequate number of viral copies(<138 copies/mL). A negative result must be combined with clinical observations, patient history, and epidemiological information. The expected result is Negative.  Fact Sheet for Patients:   BloggerCourse.com  Fact Sheet for Healthcare Providers:  SeriousBroker.it  This test is no t yet approved or cleared by the [place] FDA and  has been authorized for detection and/or diagnosis of SARS-CoV-2 by FDA under an Emergency Use Authorization (EUA). This EUA will remain  in effect (meaning this test can be used) for the duration of the COVID-19 declaration under Section 564(b)(1) of the Act, 21 U.S.C.section 360bbb-3(b)(1), unless the authorization is terminated  or revoked sooner.       Influenza A by PCR NEGATIVE NEGATIVE Final   Influenza B by PCR NEGATIVE NEGATIVE Final    Comment: (NOTE) The Xpert Xpress SARS-CoV-2/FLU/RSV plus assay is intended as an aid in the diagnosis of influenza from Nasopharyngeal swab specimens and should not be used as a sole basis for treatment. Nasal washings and aspirates are unacceptable for Xpert Xpress SARS-CoV-2/FLU/RSV testing.  Fact Sheet for Patients: BloggerCourse.com  Fact Sheet for Healthcare Providers: SeriousBroker.it  This  test is not yet approved or cleared by the [place] and has been authorized for detection and/or diagnosis of SARS-CoV-2 by FDA under an Emergency Use Authorization (EUA). This EUA will remain in effect (meaning this test can be used) for the duration of the COVID-19 declaration under Section 564(b)(1) of the Act, 21 U.S.C. section 360bbb-3(b)(1), unless the authorization is terminated or revoked.  Performed at [facility], [address], [place], [place] [address]      Discharge Instructions:   Discharge Instructions     Diet - low sodium heart healthy   Complete by: As directed    Discharge instructions   Complete by: As directed    Follow up with Dr. Belia Heman. Pulmonologist, as scheduled   Increase activity slowly   Complete by: As directed       Allergies as of  11/25/2020       Reactions   Alpha Blocker Quinazolines    Clindamycin/lincomycin Nausea Only   Doxycycline Other (See Comments)   Levaquin [levofloxacin]    Penicillins    Singulair [montelukast Sodium] Other (See Comments)   Flu like symptoms   Sulfa Antibiotics Other (See Comments)   Tamsulosin Other (See Comments)        Medication List     STOP taking these medications    fluticasone 50 MCG/ACT nasal spray Commonly known as: FLONASE   lidocaine 2 % solution Commonly known as: XYLOCAINE   multivitamin with minerals Tabs tablet   nystatin 100000 UNIT/ML suspension Commonly known as: MYCOSTATIN   PARoxetine 10 MG tablet Commonly known as: PAXIL   Restasis 0.05 % ophthalmic emulsion Generic drug: cycloSPORINE       TAKE these medications    albuterol 108 (90 Base) MCG/ACT inhaler Commonly known as: VENTOLIN HFA Inhale 2 puffs into the lungs every 4 (four) hours as needed for wheezing or shortness of breath.   budesonide 0.5 MG/2ML nebulizer solution Commonly known as: PULMICORT INHALE 1 VIAL TWICE A DAY   cetirizine 10 MG tablet Commonly known as: ZYRTEC Take 1 tablet by mouth daily as needed.   feeding supplement Liqd Take 237 mLs by mouth 2 (two) times daily between meals.   furosemide 40 MG tablet Commonly known as: LASIX Take 1 tablet (40 mg total) by mouth 2 (two) times daily. What changed:  medication strength how much to take when to take this   ipratropium-albuterol 0.5-2.5 (3) MG/3ML Soln Commonly known as: DUONEB Take 3 mLs by nebulization every 8 (eight) hours. Dx:J44.9   nebivolol 5 MG tablet Commonly known as: BYSTOLIC Take 0.5 tablets (2.5 mg total) by mouth daily.   omeprazole 40 MG capsule Commonly known as: PRILOSEC Take 1 capsule by mouth 2 (two) times daily.   potassium chloride 10 MEQ tablet Commonly known as: KLOR-CON Take 1 tablet (10 mEq total) by mouth daily.   predniSONE 20 MG tablet Commonly known as:  DELTASONE Take 2 tablets (40 mg total) by mouth daily for 2 days. Start taking on: November 26, 2020 What changed: how much to take           If you experience worsening of your admission symptoms, develop shortness of breath, life threatening emergency, suicidal or homicidal thoughts you must seek medical attention immediately by calling 911 or calling your MD immediately  if symptoms less severe.   You must read complete instructions/literature along with all the possible adverse reactions/side effects for all the medicines you take and that have been prescribed  to you. Take any new medicines after you have completely understood and accept all the possible adverse reactions/side effects.    Please note   You were cared for by a hospitalist during your hospital stay. If you have any questions about your discharge medications or the care you received while you were in the hospital after you are discharged, you can call the unit and asked to speak with the hospitalist on call if the hospitalist that took care of you is not available. Once you are discharged, your primary care physician will handle any further medical issues. Please note that NO REFILLS for any discharge medications will be authorized once you are discharged, as it is imperative that you return to your primary care physician (or establish a relationship with a primary care physician if you do not have one) for your aftercare needs so that they can reassess your need for medications and monitor your lab values.       Time coordinating discharge: 33 minutes  Signed:  Destinee Taber  [facility] 11/25/2020, 5:09 PM   Pager on [email or web address]. If 7PM-7AM, please contact night-coverage at [email or web address]

## 2020-11-25 NOTE — Care Management Important Message (Signed)
Important Message  Patient Details  Name: Charles Webster MRN: [id number] Date of Birth: May 30, 1945   Medicare Important Message Given:  Yes     Olegario Messier A Ernesto Lashway 11/25/2020, 10:46 AM

## 2020-11-29 DIAGNOSIS — K219 Gastro-esophageal reflux disease without esophagitis: Secondary | ICD-10-CM | POA: Diagnosis not present

## 2020-11-29 DIAGNOSIS — J449 Chronic obstructive pulmonary disease, unspecified: Secondary | ICD-10-CM | POA: Diagnosis not present

## 2020-11-29 DIAGNOSIS — I110 Hypertensive heart disease with heart failure: Secondary | ICD-10-CM | POA: Diagnosis not present

## 2020-11-29 DIAGNOSIS — I5033 Acute on chronic diastolic (congestive) heart failure: Secondary | ICD-10-CM | POA: Diagnosis not present

## 2020-11-29 DIAGNOSIS — J9611 Chronic respiratory failure with hypoxia: Secondary | ICD-10-CM | POA: Diagnosis not present

## 2020-11-29 DIAGNOSIS — D649 Anemia, unspecified: Secondary | ICD-10-CM | POA: Diagnosis not present

## 2020-11-30 DIAGNOSIS — D649 Anemia, unspecified: Secondary | ICD-10-CM | POA: Diagnosis not present

## 2020-11-30 DIAGNOSIS — K219 Gastro-esophageal reflux disease without esophagitis: Secondary | ICD-10-CM | POA: Diagnosis not present

## 2020-11-30 DIAGNOSIS — I110 Hypertensive heart disease with heart failure: Secondary | ICD-10-CM | POA: Diagnosis not present

## 2020-11-30 DIAGNOSIS — J449 Chronic obstructive pulmonary disease, unspecified: Secondary | ICD-10-CM | POA: Diagnosis not present

## 2020-11-30 DIAGNOSIS — J9611 Chronic respiratory failure with hypoxia: Secondary | ICD-10-CM | POA: Diagnosis not present

## 2020-11-30 DIAGNOSIS — I5033 Acute on chronic diastolic (congestive) heart failure: Secondary | ICD-10-CM | POA: Diagnosis not present

## 2020-12-01 DIAGNOSIS — D649 Anemia, unspecified: Secondary | ICD-10-CM | POA: Diagnosis not present

## 2020-12-01 DIAGNOSIS — J441 Chronic obstructive pulmonary disease with (acute) exacerbation: Secondary | ICD-10-CM | POA: Diagnosis not present

## 2020-12-01 DIAGNOSIS — I5033 Acute on chronic diastolic (congestive) heart failure: Secondary | ICD-10-CM | POA: Diagnosis not present

## 2020-12-01 DIAGNOSIS — K219 Gastro-esophageal reflux disease without esophagitis: Secondary | ICD-10-CM | POA: Diagnosis not present

## 2020-12-01 DIAGNOSIS — I509 Heart failure, unspecified: Secondary | ICD-10-CM | POA: Diagnosis not present

## 2020-12-01 DIAGNOSIS — I110 Hypertensive heart disease with heart failure: Secondary | ICD-10-CM | POA: Diagnosis not present

## 2020-12-01 DIAGNOSIS — J449 Chronic obstructive pulmonary disease, unspecified: Secondary | ICD-10-CM | POA: Diagnosis not present

## 2020-12-01 DIAGNOSIS — J9611 Chronic respiratory failure with hypoxia: Secondary | ICD-10-CM | POA: Diagnosis not present

## 2020-12-01 DIAGNOSIS — K5792 Diverticulitis of intestine, part unspecified, without perforation or abscess without bleeding: Secondary | ICD-10-CM | POA: Diagnosis not present

## 2020-12-02 ENCOUNTER — Telehealth: Payer: Self-pay | Admitting: Internal Medicine

## 2020-12-02 DIAGNOSIS — D649 Anemia, unspecified: Secondary | ICD-10-CM | POA: Diagnosis not present

## 2020-12-02 DIAGNOSIS — I5033 Acute on chronic diastolic (congestive) heart failure: Secondary | ICD-10-CM | POA: Diagnosis not present

## 2020-12-02 DIAGNOSIS — J9611 Chronic respiratory failure with hypoxia: Secondary | ICD-10-CM | POA: Diagnosis not present

## 2020-12-02 DIAGNOSIS — I110 Hypertensive heart disease with heart failure: Secondary | ICD-10-CM | POA: Diagnosis not present

## 2020-12-02 DIAGNOSIS — I509 Heart failure, unspecified: Secondary | ICD-10-CM | POA: Diagnosis not present

## 2020-12-02 DIAGNOSIS — J441 Chronic obstructive pulmonary disease with (acute) exacerbation: Secondary | ICD-10-CM | POA: Diagnosis not present

## 2020-12-02 DIAGNOSIS — K219 Gastro-esophageal reflux disease without esophagitis: Secondary | ICD-10-CM | POA: Diagnosis not present

## 2020-12-02 DIAGNOSIS — J449 Chronic obstructive pulmonary disease, unspecified: Secondary | ICD-10-CM | POA: Diagnosis not present

## 2020-12-02 NOTE — Telephone Encounter (Signed)
Lm for patient.  

## 2020-12-02 NOTE — Telephone Encounter (Signed)
Spoke to patient.  Patient is requesting a Rx for inogen machine.  Recent admission. He is on 5L o2. Explained to patient that an inogen is pulse dosage only and it does not support 5L cont.   Dr. Belia Heman, please advise?

## 2020-12-05 NOTE — Telephone Encounter (Signed)
Patient is aware of below message and voiced his understanding. He is aware that inogen does not support 5L. He would still like order to be placed, as he wears 2-5L.  Order placed. Nothing further needed at this time.

## 2020-12-06 DIAGNOSIS — J9611 Chronic respiratory failure with hypoxia: Secondary | ICD-10-CM | POA: Diagnosis not present

## 2020-12-06 DIAGNOSIS — J449 Chronic obstructive pulmonary disease, unspecified: Secondary | ICD-10-CM | POA: Diagnosis not present

## 2020-12-06 DIAGNOSIS — I110 Hypertensive heart disease with heart failure: Secondary | ICD-10-CM | POA: Diagnosis not present

## 2020-12-06 DIAGNOSIS — K219 Gastro-esophageal reflux disease without esophagitis: Secondary | ICD-10-CM | POA: Diagnosis not present

## 2020-12-06 DIAGNOSIS — D649 Anemia, unspecified: Secondary | ICD-10-CM | POA: Diagnosis not present

## 2020-12-06 DIAGNOSIS — I5033 Acute on chronic diastolic (congestive) heart failure: Secondary | ICD-10-CM | POA: Diagnosis not present

## 2020-12-09 DIAGNOSIS — D649 Anemia, unspecified: Secondary | ICD-10-CM | POA: Diagnosis not present

## 2020-12-09 DIAGNOSIS — J9611 Chronic respiratory failure with hypoxia: Secondary | ICD-10-CM | POA: Diagnosis not present

## 2020-12-09 DIAGNOSIS — I5033 Acute on chronic diastolic (congestive) heart failure: Secondary | ICD-10-CM | POA: Diagnosis not present

## 2020-12-09 DIAGNOSIS — K219 Gastro-esophageal reflux disease without esophagitis: Secondary | ICD-10-CM | POA: Diagnosis not present

## 2020-12-09 DIAGNOSIS — J449 Chronic obstructive pulmonary disease, unspecified: Secondary | ICD-10-CM | POA: Diagnosis not present

## 2020-12-09 DIAGNOSIS — I110 Hypertensive heart disease with heart failure: Secondary | ICD-10-CM | POA: Diagnosis not present

## 2020-12-11 DIAGNOSIS — F419 Anxiety disorder, unspecified: Secondary | ICD-10-CM | POA: Diagnosis not present

## 2020-12-11 DIAGNOSIS — Z9981 Dependence on supplemental oxygen: Secondary | ICD-10-CM | POA: Diagnosis not present

## 2020-12-11 DIAGNOSIS — Z87891 Personal history of nicotine dependence: Secondary | ICD-10-CM | POA: Diagnosis not present

## 2020-12-11 DIAGNOSIS — K449 Diaphragmatic hernia without obstruction or gangrene: Secondary | ICD-10-CM | POA: Diagnosis not present

## 2020-12-11 DIAGNOSIS — Z902 Acquired absence of lung [part of]: Secondary | ICD-10-CM | POA: Diagnosis not present

## 2020-12-11 DIAGNOSIS — Z8701 Personal history of pneumonia (recurrent): Secondary | ICD-10-CM | POA: Diagnosis not present

## 2020-12-11 DIAGNOSIS — J9622 Acute and chronic respiratory failure with hypercapnia: Secondary | ICD-10-CM | POA: Diagnosis not present

## 2020-12-11 DIAGNOSIS — J9621 Acute and chronic respiratory failure with hypoxia: Secondary | ICD-10-CM | POA: Diagnosis not present

## 2020-12-11 DIAGNOSIS — K5792 Diverticulitis of intestine, part unspecified, without perforation or abscess without bleeding: Secondary | ICD-10-CM | POA: Diagnosis not present

## 2020-12-11 DIAGNOSIS — Z8616 Personal history of COVID-19: Secondary | ICD-10-CM | POA: Diagnosis not present

## 2020-12-11 DIAGNOSIS — K219 Gastro-esophageal reflux disease without esophagitis: Secondary | ICD-10-CM | POA: Diagnosis not present

## 2020-12-11 DIAGNOSIS — Z9181 History of falling: Secondary | ICD-10-CM | POA: Diagnosis not present

## 2020-12-11 DIAGNOSIS — J439 Emphysema, unspecified: Secondary | ICD-10-CM | POA: Diagnosis not present

## 2020-12-11 DIAGNOSIS — I5033 Acute on chronic diastolic (congestive) heart failure: Secondary | ICD-10-CM | POA: Diagnosis not present

## 2020-12-11 DIAGNOSIS — I110 Hypertensive heart disease with heart failure: Secondary | ICD-10-CM | POA: Diagnosis not present

## 2020-12-11 DIAGNOSIS — D649 Anemia, unspecified: Secondary | ICD-10-CM | POA: Diagnosis not present

## 2020-12-11 DIAGNOSIS — F32A Depression, unspecified: Secondary | ICD-10-CM | POA: Diagnosis not present

## 2020-12-12 DIAGNOSIS — I110 Hypertensive heart disease with heart failure: Secondary | ICD-10-CM | POA: Diagnosis not present

## 2020-12-12 DIAGNOSIS — J439 Emphysema, unspecified: Secondary | ICD-10-CM | POA: Diagnosis not present

## 2020-12-12 DIAGNOSIS — J9621 Acute and chronic respiratory failure with hypoxia: Secondary | ICD-10-CM | POA: Diagnosis not present

## 2020-12-12 DIAGNOSIS — I5033 Acute on chronic diastolic (congestive) heart failure: Secondary | ICD-10-CM | POA: Diagnosis not present

## 2020-12-12 DIAGNOSIS — J9622 Acute and chronic respiratory failure with hypercapnia: Secondary | ICD-10-CM | POA: Diagnosis not present

## 2020-12-12 DIAGNOSIS — D649 Anemia, unspecified: Secondary | ICD-10-CM | POA: Diagnosis not present

## 2020-12-13 DIAGNOSIS — I5033 Acute on chronic diastolic (congestive) heart failure: Secondary | ICD-10-CM | POA: Diagnosis not present

## 2020-12-13 DIAGNOSIS — Z7689 Persons encountering health services in other specified circumstances: Secondary | ICD-10-CM | POA: Diagnosis not present

## 2020-12-13 DIAGNOSIS — J410 Simple chronic bronchitis: Secondary | ICD-10-CM | POA: Diagnosis not present

## 2020-12-13 DIAGNOSIS — I110 Hypertensive heart disease with heart failure: Secondary | ICD-10-CM | POA: Diagnosis not present

## 2020-12-13 DIAGNOSIS — I25118 Atherosclerotic heart disease of native coronary artery with other forms of angina pectoris: Secondary | ICD-10-CM | POA: Diagnosis not present

## 2020-12-13 DIAGNOSIS — J9622 Acute and chronic respiratory failure with hypercapnia: Secondary | ICD-10-CM | POA: Diagnosis not present

## 2020-12-13 DIAGNOSIS — Z20828 Contact with and (suspected) exposure to other viral communicable diseases: Secondary | ICD-10-CM | POA: Diagnosis not present

## 2020-12-13 DIAGNOSIS — D649 Anemia, unspecified: Secondary | ICD-10-CM | POA: Diagnosis not present

## 2020-12-13 DIAGNOSIS — J439 Emphysema, unspecified: Secondary | ICD-10-CM | POA: Diagnosis not present

## 2020-12-13 DIAGNOSIS — J9621 Acute and chronic respiratory failure with hypoxia: Secondary | ICD-10-CM | POA: Diagnosis not present

## 2020-12-13 DIAGNOSIS — I10 Essential (primary) hypertension: Secondary | ICD-10-CM | POA: Diagnosis not present

## 2020-12-14 ENCOUNTER — Encounter: Payer: Self-pay | Admitting: Internal Medicine

## 2020-12-14 ENCOUNTER — Other Ambulatory Visit: Payer: Self-pay

## 2020-12-14 ENCOUNTER — Ambulatory Visit (INDEPENDENT_AMBULATORY_CARE_PROVIDER_SITE_OTHER): Payer: Medicare Other | Admitting: Internal Medicine

## 2020-12-14 VITALS — BP 100/70 | HR 85 | Temp 97.5°F | Ht 66.0 in | Wt 116.6 lb

## 2020-12-14 DIAGNOSIS — J449 Chronic obstructive pulmonary disease, unspecified: Secondary | ICD-10-CM | POA: Diagnosis not present

## 2020-12-14 DIAGNOSIS — J9611 Chronic respiratory failure with hypoxia: Secondary | ICD-10-CM

## 2020-12-14 MED ORDER — PREDNISONE 10 MG PO TABS: 20.0000 mg | ORAL_TABLET | Freq: Every day | ORAL | 3 refills | Status: DC

## 2020-12-14 NOTE — Patient Instructions (Signed)
Continue oxygen as prescribed Continue prednisone as prescribed Continue nebulizers as prescribed 

## 2020-12-14 NOTE — Addendum Note (Signed)
Addended by: Katrinka Blazing R on: 12/14/2020 10:20 AM   Modules accepted: Orders

## 2020-12-14 NOTE — Progress Notes (Signed)
Name: Charles Webster MRN: [id number] DOB: [birth year]     CONSULTATION DATE: 5.9.19 REFERRING MD : Tor Netters  STUDIES:   4.7.18   CXR independently reviewed by Me  Increased lung volumes No pneumonia No effusions  Previous HISTORY OF PRESENT ILLNESS: 75 year old pleasant white male seen today for assessment for COPD Patient states he has been diagnosed with COPD for approximately 2 years ago however based on further questioning patient has chronic shortness of breath and progressive dyspnea exertion for the past 5 years  Patient has a history of tobacco abuse 1 pack a day for 40 years Patient worked at the [employer] and was exposed to paints 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  Patient states 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 at [facility]  09/2020 COVID infection and ICU admission for COPD and CHF exacerbation  CC Follow up SEVERE COPD   HPI Follow-up assessment for severe COPD Chronic shortness of breath dyspnea exertion Recent admission to ICU for COVID infection CHF exacerbation and COPD exacerbation Has done really well with pulmonary rehab Currently on 5 L of oxygen Previous history of multiple bouts of COPD exacerbation   Patient has been switched to nebulized therapy Pulmicort twice daily DuoNebs every 4 This regimen seems to help him however he is an active COPD exacerbation  No exacerbation at this time No evidence of heart failure at this time No evidence or signs of infection at this time No respiratory distress No fevers, chills, nausea, vomiting, diarrhea No evidence of lower extremity edema No evidence hemoptysis   Stopped taking traditional inhaler therapy due to poor respiratory insufficiency  Patient uses and benefits oxygen therapy He needs this for survival  Patient has failed all types of inhaler therapy as well as albuterol neb therapy  Outpatient  Encounter Medications as of 12/14/2020  Medication Sig   albuterol (VENTOLIN HFA) 108 (90 Base) MCG/ACT inhaler Inhale 2 puffs into the lungs every 4 (four) hours as needed for wheezing or shortness of breath.   budesonide (PULMICORT) 0.5 MG/2ML nebulizer solution INHALE 1 VIAL TWICE A DAY   cetirizine (ZYRTEC) 10 MG tablet Take 1 tablet by mouth daily as needed.   feeding supplement (ENSURE ENLIVE / ENSURE PLUS) LIQD Take 237 mLs by mouth 2 (two) times daily between meals.   furosemide (LASIX) 40 MG tablet Take 1 tablet (40 mg total) by mouth 2 (two) times daily.   ipratropium-albuterol (DUONEB) 0.5-2.5 (3) MG/3ML SOLN Take 3 mLs by nebulization every 8 (eight) hours. Dx:J44.9   nebivolol (BYSTOLIC) 5 MG tablet Take 0.5 tablets (2.5 mg total) by mouth daily.   omeprazole (PRILOSEC) 40 MG capsule Take 1 capsule by mouth 2 (two) times daily.   OXYGEN Inhale into the lungs. 5 liters   potassium chloride (KLOR-CON) 10 MEQ tablet Take 1 tablet (10 mEq total) by mouth daily.   predniSONE (DELTASONE) 20 MG tablet Take 20 mg by mouth daily with breakfast.   No facility-administered encounter medications on file as of 12/14/2020.      Review of Systems:  Gen:  Denies  fever, sweats, chills weight loss  HEENT: Denies blurred vision, double vision, ear pain, eye pain, hearing loss, nose bleeds, sore throat Cardiac:  No dizziness, chest pain or heaviness, chest tightness,edema, No JVD Resp:   + cough, -sputum production, +shortness of breath,-wheezing, -hemoptysis,  Other:  All other systems negative     BP  100/70 (BP Location: Left Arm, Patient Position: Sitting, Cuff Size: Normal)   Pulse 85   Temp (!) 97.5 F (36.4 C) (Oral)   Ht 5\' 6"  (1.676 m)   Wt 116 lb 9.6 oz (52.9 kg)   SpO2 98%   BMI 18.82 kg/m    Physical Examination:   General Appearance: No distress  EYES PERRLA, EOM intact.   NECK Supple, No JVD Pulmonary: normal breath sounds, No wheezing.  CardiovascularNormal  S1,S2.  No m/r/g.   ALL OTHER ROS ARE NEGATIVE    ASSESSMENT / PLAN:  75 year old pleasant white male with end-stage COPD Gold stage D  Patient with recurrent bouts of COPD exacerbation, associated with chronic hypoxic respiratory failure respiratory insufficiency  COPD end-stage Gold stage D Patient would like to continue with longstanding prednisone therapy Continue nebulized therapy as prescribed Avoid secondhand smoke Continue oxygen as prescribed  Gold stage D COPD Continue nebulized therapy Patient cannot use traditional inhaler therapy anymore due to respiratory insufficiency and severe COPD   Chronic hypoxic respiratory failure from COPD Patient uses and benefits from oxygen therapy at nighttime He needs this for survival   MEDICATION ADJUSTMENTS/LABS AND TESTS ORDERED: Continue oxygen as prescribed Continue prednisone as prescribed Continue nebulizers as prescribed   CURRENT MEDICATIONS REVIEWED AT LENGTH WITH PATIENT TODAY   Patient satisfied with Plan of action and management. All questions answered   Follow-up in 6 months   Total time spent 25 minutes   61 Wallis Bamberg, M.D.  [facility]  Medical Director [facility] Medical Director [facility] Cardio-Pulmonary Department

## 2020-12-15 DIAGNOSIS — J439 Emphysema, unspecified: Secondary | ICD-10-CM | POA: Diagnosis not present

## 2020-12-15 DIAGNOSIS — J9621 Acute and chronic respiratory failure with hypoxia: Secondary | ICD-10-CM | POA: Diagnosis not present

## 2020-12-15 DIAGNOSIS — D649 Anemia, unspecified: Secondary | ICD-10-CM | POA: Diagnosis not present

## 2020-12-15 DIAGNOSIS — I5033 Acute on chronic diastolic (congestive) heart failure: Secondary | ICD-10-CM | POA: Diagnosis not present

## 2020-12-15 DIAGNOSIS — J9622 Acute and chronic respiratory failure with hypercapnia: Secondary | ICD-10-CM | POA: Diagnosis not present

## 2020-12-15 DIAGNOSIS — I110 Hypertensive heart disease with heart failure: Secondary | ICD-10-CM | POA: Diagnosis not present

## 2020-12-19 DIAGNOSIS — D649 Anemia, unspecified: Secondary | ICD-10-CM | POA: Diagnosis not present

## 2020-12-19 DIAGNOSIS — I5033 Acute on chronic diastolic (congestive) heart failure: Secondary | ICD-10-CM | POA: Diagnosis not present

## 2020-12-19 DIAGNOSIS — J439 Emphysema, unspecified: Secondary | ICD-10-CM | POA: Diagnosis not present

## 2020-12-19 DIAGNOSIS — J9622 Acute and chronic respiratory failure with hypercapnia: Secondary | ICD-10-CM | POA: Diagnosis not present

## 2020-12-19 DIAGNOSIS — I110 Hypertensive heart disease with heart failure: Secondary | ICD-10-CM | POA: Diagnosis not present

## 2020-12-19 DIAGNOSIS — J9621 Acute and chronic respiratory failure with hypoxia: Secondary | ICD-10-CM | POA: Diagnosis not present

## 2020-12-21 DIAGNOSIS — I110 Hypertensive heart disease with heart failure: Secondary | ICD-10-CM | POA: Diagnosis not present

## 2020-12-21 DIAGNOSIS — J9622 Acute and chronic respiratory failure with hypercapnia: Secondary | ICD-10-CM | POA: Diagnosis not present

## 2020-12-21 DIAGNOSIS — J9621 Acute and chronic respiratory failure with hypoxia: Secondary | ICD-10-CM | POA: Diagnosis not present

## 2020-12-21 DIAGNOSIS — J439 Emphysema, unspecified: Secondary | ICD-10-CM | POA: Diagnosis not present

## 2020-12-21 DIAGNOSIS — D649 Anemia, unspecified: Secondary | ICD-10-CM | POA: Diagnosis not present

## 2020-12-21 DIAGNOSIS — I5033 Acute on chronic diastolic (congestive) heart failure: Secondary | ICD-10-CM | POA: Diagnosis not present

## 2020-12-27 DIAGNOSIS — I5033 Acute on chronic diastolic (congestive) heart failure: Secondary | ICD-10-CM | POA: Diagnosis not present

## 2020-12-27 DIAGNOSIS — J439 Emphysema, unspecified: Secondary | ICD-10-CM | POA: Diagnosis not present

## 2020-12-27 DIAGNOSIS — I110 Hypertensive heart disease with heart failure: Secondary | ICD-10-CM | POA: Diagnosis not present

## 2020-12-27 DIAGNOSIS — D649 Anemia, unspecified: Secondary | ICD-10-CM | POA: Diagnosis not present

## 2020-12-27 DIAGNOSIS — J9621 Acute and chronic respiratory failure with hypoxia: Secondary | ICD-10-CM | POA: Diagnosis not present

## 2020-12-27 DIAGNOSIS — J9622 Acute and chronic respiratory failure with hypercapnia: Secondary | ICD-10-CM | POA: Diagnosis not present

## 2021-01-03 DIAGNOSIS — D649 Anemia, unspecified: Secondary | ICD-10-CM | POA: Diagnosis not present

## 2021-01-03 DIAGNOSIS — J9621 Acute and chronic respiratory failure with hypoxia: Secondary | ICD-10-CM | POA: Diagnosis not present

## 2021-01-03 DIAGNOSIS — I5033 Acute on chronic diastolic (congestive) heart failure: Secondary | ICD-10-CM | POA: Diagnosis not present

## 2021-01-03 DIAGNOSIS — J9622 Acute and chronic respiratory failure with hypercapnia: Secondary | ICD-10-CM | POA: Diagnosis not present

## 2021-01-03 DIAGNOSIS — I110 Hypertensive heart disease with heart failure: Secondary | ICD-10-CM | POA: Diagnosis not present

## 2021-01-03 DIAGNOSIS — J439 Emphysema, unspecified: Secondary | ICD-10-CM | POA: Diagnosis not present

## 2021-01-05 DIAGNOSIS — I5033 Acute on chronic diastolic (congestive) heart failure: Secondary | ICD-10-CM | POA: Diagnosis not present

## 2021-01-05 DIAGNOSIS — J9622 Acute and chronic respiratory failure with hypercapnia: Secondary | ICD-10-CM | POA: Diagnosis not present

## 2021-01-05 DIAGNOSIS — J439 Emphysema, unspecified: Secondary | ICD-10-CM | POA: Diagnosis not present

## 2021-01-05 DIAGNOSIS — J9621 Acute and chronic respiratory failure with hypoxia: Secondary | ICD-10-CM | POA: Diagnosis not present

## 2021-01-05 DIAGNOSIS — I110 Hypertensive heart disease with heart failure: Secondary | ICD-10-CM | POA: Diagnosis not present

## 2021-01-05 DIAGNOSIS — D649 Anemia, unspecified: Secondary | ICD-10-CM | POA: Diagnosis not present

## 2021-01-06 DIAGNOSIS — Z0000 Encounter for general adult medical examination without abnormal findings: Secondary | ICD-10-CM | POA: Diagnosis not present

## 2021-01-06 DIAGNOSIS — J9611 Chronic respiratory failure with hypoxia: Secondary | ICD-10-CM | POA: Diagnosis not present

## 2021-01-06 DIAGNOSIS — I25118 Atherosclerotic heart disease of native coronary artery with other forms of angina pectoris: Secondary | ICD-10-CM | POA: Diagnosis not present

## 2021-01-06 DIAGNOSIS — N1831 Chronic kidney disease, stage 3a: Secondary | ICD-10-CM | POA: Diagnosis not present

## 2021-01-06 DIAGNOSIS — I5033 Acute on chronic diastolic (congestive) heart failure: Secondary | ICD-10-CM | POA: Diagnosis not present

## 2021-01-06 DIAGNOSIS — J449 Chronic obstructive pulmonary disease, unspecified: Secondary | ICD-10-CM | POA: Diagnosis not present

## 2021-01-06 DIAGNOSIS — K146 Glossodynia: Secondary | ICD-10-CM | POA: Diagnosis not present

## 2021-01-06 DIAGNOSIS — K219 Gastro-esophageal reflux disease without esophagitis: Secondary | ICD-10-CM | POA: Diagnosis not present

## 2021-01-09 DIAGNOSIS — D649 Anemia, unspecified: Secondary | ICD-10-CM | POA: Diagnosis not present

## 2021-01-09 DIAGNOSIS — I5033 Acute on chronic diastolic (congestive) heart failure: Secondary | ICD-10-CM | POA: Diagnosis not present

## 2021-01-11 DIAGNOSIS — I25118 Atherosclerotic heart disease of native coronary artery with other forms of angina pectoris: Secondary | ICD-10-CM | POA: Diagnosis not present

## 2021-01-11 DIAGNOSIS — I5033 Acute on chronic diastolic (congestive) heart failure: Secondary | ICD-10-CM | POA: Diagnosis not present

## 2021-01-19 ENCOUNTER — Other Ambulatory Visit: Payer: Self-pay | Admitting: Internal Medicine

## 2021-01-19 DIAGNOSIS — J9611 Chronic respiratory failure with hypoxia: Secondary | ICD-10-CM

## 2021-01-19 DIAGNOSIS — J441 Chronic obstructive pulmonary disease with (acute) exacerbation: Secondary | ICD-10-CM

## 2021-02-07 DIAGNOSIS — I10 Essential (primary) hypertension: Secondary | ICD-10-CM | POA: Diagnosis not present

## 2021-02-07 DIAGNOSIS — J9611 Chronic respiratory failure with hypoxia: Secondary | ICD-10-CM | POA: Diagnosis not present

## 2021-02-07 DIAGNOSIS — J449 Chronic obstructive pulmonary disease, unspecified: Secondary | ICD-10-CM | POA: Diagnosis not present

## 2021-02-07 DIAGNOSIS — I5032 Chronic diastolic (congestive) heart failure: Secondary | ICD-10-CM | POA: Diagnosis not present

## 2021-02-07 DIAGNOSIS — I25118 Atherosclerotic heart disease of native coronary artery with other forms of angina pectoris: Secondary | ICD-10-CM | POA: Diagnosis not present

## 2021-02-07 DIAGNOSIS — I2510 Atherosclerotic heart disease of native coronary artery without angina pectoris: Secondary | ICD-10-CM | POA: Diagnosis not present

## 2021-02-08 DIAGNOSIS — H16223 Keratoconjunctivitis sicca, not specified as Sjogren's, bilateral: Secondary | ICD-10-CM | POA: Diagnosis not present

## 2021-02-08 DIAGNOSIS — M9902 Segmental and somatic dysfunction of thoracic region: Secondary | ICD-10-CM | POA: Diagnosis not present

## 2021-02-08 DIAGNOSIS — M5134 Other intervertebral disc degeneration, thoracic region: Secondary | ICD-10-CM | POA: Diagnosis not present

## 2021-02-08 DIAGNOSIS — H02889 Meibomian gland dysfunction of unspecified eye, unspecified eyelid: Secondary | ICD-10-CM | POA: Diagnosis not present

## 2021-02-08 DIAGNOSIS — H353132 Nonexudative age-related macular degeneration, bilateral, intermediate dry stage: Secondary | ICD-10-CM | POA: Diagnosis not present

## 2021-02-08 DIAGNOSIS — M62830 Muscle spasm of back: Secondary | ICD-10-CM | POA: Diagnosis not present

## 2021-02-08 DIAGNOSIS — M9903 Segmental and somatic dysfunction of lumbar region: Secondary | ICD-10-CM | POA: Diagnosis not present

## 2021-02-08 DIAGNOSIS — H2513 Age-related nuclear cataract, bilateral: Secondary | ICD-10-CM | POA: Diagnosis not present

## 2021-02-13 DIAGNOSIS — M9903 Segmental and somatic dysfunction of lumbar region: Secondary | ICD-10-CM | POA: Diagnosis not present

## 2021-02-13 DIAGNOSIS — D72829 Elevated white blood cell count, unspecified: Secondary | ICD-10-CM | POA: Diagnosis not present

## 2021-02-13 DIAGNOSIS — M62830 Muscle spasm of back: Secondary | ICD-10-CM | POA: Diagnosis not present

## 2021-02-13 DIAGNOSIS — M9902 Segmental and somatic dysfunction of thoracic region: Secondary | ICD-10-CM | POA: Diagnosis not present

## 2021-02-13 DIAGNOSIS — M5134 Other intervertebral disc degeneration, thoracic region: Secondary | ICD-10-CM | POA: Diagnosis not present

## 2021-02-15 DIAGNOSIS — M5134 Other intervertebral disc degeneration, thoracic region: Secondary | ICD-10-CM | POA: Diagnosis not present

## 2021-02-15 DIAGNOSIS — M62830 Muscle spasm of back: Secondary | ICD-10-CM | POA: Diagnosis not present

## 2021-02-15 DIAGNOSIS — M9903 Segmental and somatic dysfunction of lumbar region: Secondary | ICD-10-CM | POA: Diagnosis not present

## 2021-02-15 DIAGNOSIS — M9902 Segmental and somatic dysfunction of thoracic region: Secondary | ICD-10-CM | POA: Diagnosis not present

## 2021-02-17 DIAGNOSIS — M9903 Segmental and somatic dysfunction of lumbar region: Secondary | ICD-10-CM | POA: Diagnosis not present

## 2021-02-17 DIAGNOSIS — M9902 Segmental and somatic dysfunction of thoracic region: Secondary | ICD-10-CM | POA: Diagnosis not present

## 2021-02-17 DIAGNOSIS — M5134 Other intervertebral disc degeneration, thoracic region: Secondary | ICD-10-CM | POA: Diagnosis not present

## 2021-02-17 DIAGNOSIS — J9611 Chronic respiratory failure with hypoxia: Secondary | ICD-10-CM | POA: Diagnosis not present

## 2021-02-17 DIAGNOSIS — I110 Hypertensive heart disease with heart failure: Secondary | ICD-10-CM | POA: Diagnosis not present

## 2021-02-17 DIAGNOSIS — J449 Chronic obstructive pulmonary disease, unspecified: Secondary | ICD-10-CM | POA: Diagnosis not present

## 2021-02-17 DIAGNOSIS — K5792 Diverticulitis of intestine, part unspecified, without perforation or abscess without bleeding: Secondary | ICD-10-CM | POA: Diagnosis not present

## 2021-02-17 DIAGNOSIS — K219 Gastro-esophageal reflux disease without esophagitis: Secondary | ICD-10-CM | POA: Diagnosis not present

## 2021-02-17 DIAGNOSIS — M62830 Muscle spasm of back: Secondary | ICD-10-CM | POA: Diagnosis not present

## 2021-02-17 DIAGNOSIS — D649 Anemia, unspecified: Secondary | ICD-10-CM | POA: Diagnosis not present

## 2021-02-17 DIAGNOSIS — I5033 Acute on chronic diastolic (congestive) heart failure: Secondary | ICD-10-CM | POA: Diagnosis not present

## 2021-02-20 DIAGNOSIS — M9902 Segmental and somatic dysfunction of thoracic region: Secondary | ICD-10-CM | POA: Diagnosis not present

## 2021-02-20 DIAGNOSIS — M9903 Segmental and somatic dysfunction of lumbar region: Secondary | ICD-10-CM | POA: Diagnosis not present

## 2021-02-20 DIAGNOSIS — M5134 Other intervertebral disc degeneration, thoracic region: Secondary | ICD-10-CM | POA: Diagnosis not present

## 2021-02-20 DIAGNOSIS — M62830 Muscle spasm of back: Secondary | ICD-10-CM | POA: Diagnosis not present

## 2021-02-22 DIAGNOSIS — M9902 Segmental and somatic dysfunction of thoracic region: Secondary | ICD-10-CM | POA: Diagnosis not present

## 2021-02-22 DIAGNOSIS — M9903 Segmental and somatic dysfunction of lumbar region: Secondary | ICD-10-CM | POA: Diagnosis not present

## 2021-02-22 DIAGNOSIS — M62830 Muscle spasm of back: Secondary | ICD-10-CM | POA: Diagnosis not present

## 2021-02-22 DIAGNOSIS — M5134 Other intervertebral disc degeneration, thoracic region: Secondary | ICD-10-CM | POA: Diagnosis not present

## 2021-02-27 ENCOUNTER — Other Ambulatory Visit: Payer: Self-pay | Admitting: Pulmonary Disease

## 2021-02-27 ENCOUNTER — Other Ambulatory Visit: Payer: Self-pay | Admitting: Internal Medicine

## 2021-02-27 DIAGNOSIS — M62830 Muscle spasm of back: Secondary | ICD-10-CM | POA: Diagnosis not present

## 2021-02-27 DIAGNOSIS — J9611 Chronic respiratory failure with hypoxia: Secondary | ICD-10-CM

## 2021-02-27 DIAGNOSIS — M5134 Other intervertebral disc degeneration, thoracic region: Secondary | ICD-10-CM | POA: Diagnosis not present

## 2021-02-27 DIAGNOSIS — J441 Chronic obstructive pulmonary disease with (acute) exacerbation: Secondary | ICD-10-CM

## 2021-02-27 DIAGNOSIS — M9903 Segmental and somatic dysfunction of lumbar region: Secondary | ICD-10-CM | POA: Diagnosis not present

## 2021-02-27 DIAGNOSIS — M9902 Segmental and somatic dysfunction of thoracic region: Secondary | ICD-10-CM | POA: Diagnosis not present

## 2021-03-01 DIAGNOSIS — M9903 Segmental and somatic dysfunction of lumbar region: Secondary | ICD-10-CM | POA: Diagnosis not present

## 2021-03-01 DIAGNOSIS — M5134 Other intervertebral disc degeneration, thoracic region: Secondary | ICD-10-CM | POA: Diagnosis not present

## 2021-03-01 DIAGNOSIS — M62830 Muscle spasm of back: Secondary | ICD-10-CM | POA: Diagnosis not present

## 2021-03-01 DIAGNOSIS — M9902 Segmental and somatic dysfunction of thoracic region: Secondary | ICD-10-CM | POA: Diagnosis not present

## 2021-03-06 DIAGNOSIS — M9903 Segmental and somatic dysfunction of lumbar region: Secondary | ICD-10-CM | POA: Diagnosis not present

## 2021-03-06 DIAGNOSIS — M9902 Segmental and somatic dysfunction of thoracic region: Secondary | ICD-10-CM | POA: Diagnosis not present

## 2021-03-06 DIAGNOSIS — M5134 Other intervertebral disc degeneration, thoracic region: Secondary | ICD-10-CM | POA: Diagnosis not present

## 2021-03-06 DIAGNOSIS — M62830 Muscle spasm of back: Secondary | ICD-10-CM | POA: Diagnosis not present

## 2021-03-08 DIAGNOSIS — M62830 Muscle spasm of back: Secondary | ICD-10-CM | POA: Diagnosis not present

## 2021-03-08 DIAGNOSIS — M9903 Segmental and somatic dysfunction of lumbar region: Secondary | ICD-10-CM | POA: Diagnosis not present

## 2021-03-08 DIAGNOSIS — M5134 Other intervertebral disc degeneration, thoracic region: Secondary | ICD-10-CM | POA: Diagnosis not present

## 2021-03-08 DIAGNOSIS — M9902 Segmental and somatic dysfunction of thoracic region: Secondary | ICD-10-CM | POA: Diagnosis not present

## 2021-03-10 DIAGNOSIS — M9902 Segmental and somatic dysfunction of thoracic region: Secondary | ICD-10-CM | POA: Diagnosis not present

## 2021-03-10 DIAGNOSIS — M62830 Muscle spasm of back: Secondary | ICD-10-CM | POA: Diagnosis not present

## 2021-03-10 DIAGNOSIS — M9903 Segmental and somatic dysfunction of lumbar region: Secondary | ICD-10-CM | POA: Diagnosis not present

## 2021-03-10 DIAGNOSIS — M5134 Other intervertebral disc degeneration, thoracic region: Secondary | ICD-10-CM | POA: Diagnosis not present

## 2021-03-13 DIAGNOSIS — M62830 Muscle spasm of back: Secondary | ICD-10-CM | POA: Diagnosis not present

## 2021-03-13 DIAGNOSIS — M9902 Segmental and somatic dysfunction of thoracic region: Secondary | ICD-10-CM | POA: Diagnosis not present

## 2021-03-13 DIAGNOSIS — M9903 Segmental and somatic dysfunction of lumbar region: Secondary | ICD-10-CM | POA: Diagnosis not present

## 2021-03-13 DIAGNOSIS — M5134 Other intervertebral disc degeneration, thoracic region: Secondary | ICD-10-CM | POA: Diagnosis not present

## 2021-03-17 DIAGNOSIS — M9903 Segmental and somatic dysfunction of lumbar region: Secondary | ICD-10-CM | POA: Diagnosis not present

## 2021-03-17 DIAGNOSIS — M5134 Other intervertebral disc degeneration, thoracic region: Secondary | ICD-10-CM | POA: Diagnosis not present

## 2021-03-17 DIAGNOSIS — M62830 Muscle spasm of back: Secondary | ICD-10-CM | POA: Diagnosis not present

## 2021-03-17 DIAGNOSIS — M9902 Segmental and somatic dysfunction of thoracic region: Secondary | ICD-10-CM | POA: Diagnosis not present

## 2021-03-20 DIAGNOSIS — M9903 Segmental and somatic dysfunction of lumbar region: Secondary | ICD-10-CM | POA: Diagnosis not present

## 2021-03-20 DIAGNOSIS — M9902 Segmental and somatic dysfunction of thoracic region: Secondary | ICD-10-CM | POA: Diagnosis not present

## 2021-03-20 DIAGNOSIS — M62830 Muscle spasm of back: Secondary | ICD-10-CM | POA: Diagnosis not present

## 2021-03-20 DIAGNOSIS — M5134 Other intervertebral disc degeneration, thoracic region: Secondary | ICD-10-CM | POA: Diagnosis not present

## 2021-03-22 DIAGNOSIS — M9903 Segmental and somatic dysfunction of lumbar region: Secondary | ICD-10-CM | POA: Diagnosis not present

## 2021-03-22 DIAGNOSIS — M9902 Segmental and somatic dysfunction of thoracic region: Secondary | ICD-10-CM | POA: Diagnosis not present

## 2021-03-22 DIAGNOSIS — M62830 Muscle spasm of back: Secondary | ICD-10-CM | POA: Diagnosis not present

## 2021-03-22 DIAGNOSIS — M5134 Other intervertebral disc degeneration, thoracic region: Secondary | ICD-10-CM | POA: Diagnosis not present

## 2021-03-23 ENCOUNTER — Other Ambulatory Visit: Payer: Self-pay

## 2021-03-23 ENCOUNTER — Emergency Department: Payer: Medicare Other

## 2021-03-23 ENCOUNTER — Emergency Department
Admission: EM | Admit: 2021-03-23 | Discharge: 2021-03-23 | Disposition: A | Discharge status: Home / Self Care | Payer: Medicare Other | Attending: Emergency Medicine | Admitting: Emergency Medicine

## 2021-03-23 DIAGNOSIS — I509 Heart failure, unspecified: Secondary | ICD-10-CM | POA: Insufficient documentation

## 2021-03-23 DIAGNOSIS — J449 Chronic obstructive pulmonary disease, unspecified: Secondary | ICD-10-CM

## 2021-03-23 DIAGNOSIS — M545 Low back pain, unspecified: Secondary | ICD-10-CM | POA: Diagnosis not present

## 2021-03-23 DIAGNOSIS — D72829 Elevated white blood cell count, unspecified: Secondary | ICD-10-CM | POA: Insufficient documentation

## 2021-03-23 DIAGNOSIS — R7989 Other specified abnormal findings of blood chemistry: Secondary | ICD-10-CM | POA: Diagnosis not present

## 2021-03-23 DIAGNOSIS — J90 Pleural effusion, not elsewhere classified: Secondary | ICD-10-CM | POA: Diagnosis not present

## 2021-03-23 DIAGNOSIS — J441 Chronic obstructive pulmonary disease with (acute) exacerbation: Secondary | ICD-10-CM | POA: Diagnosis not present

## 2021-03-23 DIAGNOSIS — R0602 Shortness of breath: Secondary | ICD-10-CM | POA: Diagnosis not present

## 2021-03-23 LAB — CBC
HCT: 38.6 % — ABNORMAL LOW (ref 39.0–52.0)
Hemoglobin: 12.3 g/dL — ABNORMAL LOW (ref 13.0–17.0)
MCH: 29.5 pg (ref 26.0–34.0)
MCHC: 31.9 g/dL (ref 30.0–36.0)
MCV: 92.6 fL (ref 80.0–100.0)
Platelets: 346 10*3/uL (ref 150–400)
RBC: 4.17 MIL/uL — ABNORMAL LOW (ref 4.22–5.81)
RDW: 15.3 % (ref 11.5–15.5)
WBC: 17.3 10*3/uL — ABNORMAL HIGH (ref 4.0–10.5)
nRBC: 0 % (ref 0.0–0.2)

## 2021-03-23 LAB — BASIC METABOLIC PANEL
Anion gap: 11 (ref 5–15)
BUN: 34 mg/dL — ABNORMAL HIGH (ref 8–23)
CO2: 33 mmol/L — ABNORMAL HIGH (ref 22–32)
Calcium: 9.8 mg/dL (ref 8.9–10.3)
Chloride: 97 mmol/L — ABNORMAL LOW (ref 98–111)
Creatinine, Ser: 1.57 mg/dL — ABNORMAL HIGH (ref 0.61–1.24)
GFR, Estimated: 45 mL/min — ABNORMAL LOW (ref >60)
Glucose, Bld: 108 mg/dL — ABNORMAL HIGH (ref 70–99)
Potassium: 3.6 mmol/L (ref 3.5–5.1)
Sodium: 141 mmol/L (ref 135–145)

## 2021-03-23 LAB — BRAIN NATRIURETIC PEPTIDE: B Natriuretic Peptide: 59.3 pg/mL (ref 0.0–100.0)

## 2021-03-23 MED ORDER — MELOXICAM 7.5 MG PO TABS
15.0000 mg | ORAL_TABLET | Freq: Once | ORAL | Status: AC
  Administered 2021-03-23: 15 mg via ORAL
  Filled 2021-03-23: qty 2

## 2021-03-23 MED ORDER — CYCLOBENZAPRINE HCL 10 MG PO TABS: 10.0000 mg | ORAL_TABLET | Freq: Three times a day (TID) | ORAL | 0 refills | Status: DC | PRN

## 2021-03-23 MED ORDER — CYCLOBENZAPRINE HCL 10 MG PO TABS
10.0000 mg | ORAL_TABLET | Freq: Once | ORAL | Status: AC
  Administered 2021-03-23: 10 mg via ORAL
  Filled 2021-03-23: qty 1

## 2021-03-23 NOTE — ED Triage Notes (Signed)
Pt here with SOB. Pt has a hx of COPD. Pt believes that he has a fluid build up around his lungs. Pt c/o back and chest pain. Pt went to [facility] and was sent here for evaluation. Pt wears oxygen at home and is in NAD in triage.

## 2021-03-23 NOTE — ED Provider Notes (Signed)
[facility] Provider Note   Event Date/Time   First MD Initiated Contact with Patient 03/23/21 1104     (approximate) History  Shortness of Breath  HPI Charles Webster is a 76 y.o. male with a stated past medical history of chronic lumbar back pain and CHF who presents for bilateral upper lumbar pain that radiates up and down bilateral paraspinal musculature.  Patient states that he has been dealing with chronic back pain similar to this for years and usually gets chiropractic "adjustments" that have helped in the past however patient does note that the last time he had pain similar to this it was due to fluid accumulation on his lungs.  Patient states that he has been taking all his medications on time and as prescribed.  Patient denies any chest pain.  Patient states that he has chronic shortness of breath and wears oxygen chronically secondary to COPD.  Patient denies any recent sick contacts but has been taking steroids for COPD exacerbation recently. Physical Exam  Triage Vital Signs: ED Triage Vitals  Enc Vitals Group     BP 03/23/21 1013 139/90     Pulse Rate 03/23/21 1013 88     Resp 03/23/21 1013 (!) 22     Temp 03/23/21 1013 98.4 F (36.9 C)     Temp Source 03/23/21 1013 Oral     SpO2 03/23/21 1013 98 %     Weight 03/23/21 1014 135 lb (61.2 kg)     Height 03/23/21 1014 5\' 6"  (1.676 m)     Head Circumference --      Peak Flow --      Pain Score 03/23/21 1013 8     Pain Loc --      Pain Edu? --      Excl. in GC? --    Most recent vital signs: Vitals:   03/23/21 1013 03/23/21 1103  BP: 139/90 (!) 120/91  Pulse: 88 84  Resp: (!) 22 (!) 25  Temp: 98.4 F (36.9 C)   SpO2: 98% 100%   General: Awake, oriented x4. CV:  Good peripheral perfusion.  Resp:  Normal effort.  Abd:  No distention.  Other:  Elderly Caucasian male laying in bed in no distress with tenderness to palpation in bilateral upper lumbar paraspinal musculature with associated  muscular spasm.  No midline tenderness ED Results / Procedures / Treatments  Labs (all labs ordered are listed, but only abnormal results are displayed) Labs Reviewed  CBC - Abnormal; Notable for the following components:      Result Value   WBC 17.3 (*)    RBC 4.17 (*)    Hemoglobin 12.3 (*)    HCT 38.6 (*)    All other components within normal limits  BASIC METABOLIC PANEL - Abnormal; Notable for the following components:   Chloride 97 (*)    CO2 33 (*)    Glucose, Bld 108 (*)    BUN 34 (*)    Creatinine, Ser 1.57 (*)    GFR, Estimated 45 (*)    All other components within normal limits  BRAIN NATRIURETIC PEPTIDE   EKG ED ECG REPORT I, 03/25/21, the attending physician, personally viewed and interpreted this ECG. Date: 03/23/2021 EKG Time: 1021 Rate: 89 Rhythm: normal sinus rhythm QRS Axis: normal Intervals: normal ST/T Wave abnormalities: normal Narrative Interpretation: no evidence of acute ischemia RADIOLOGY ED MD interpretation: 2 view chest x-ray as interpreted by me shows COPD changes with extensive  right upper lobe scarring and volume loss as well as left basilar scarring with overall improved aeration in lower lungs versus previous exam.  There is a minimal chronic right pleural effusion without any new abnormalities -Agree with radiology assessment Official radiology report(s): DG Chest 2 View  Result Date: 03/23/2021 CLINICAL DATA:  Shortness of breath, COPD, on home oxygen EXAM: CHEST - 2 VIEW COMPARISON:  11/21/2020 FINDINGS: Volume loss in the RIGHT hemithorax with mediastinal shift to the RIGHT. Stable heart size and mediastinal contours. Atherosclerotic calcifications aorta. Postoperative changes and volume loss in RIGHT upper lobe with underlying COPD changes. LEFT basilar scarring and minimal chronic RIGHT pleural effusion again noted. Improved aeration in the lower lung since the previous exam without new infiltrate or pneumothorax. Bones  demineralized. IMPRESSION: COPD changes with extensive RIGHT upper lobe scarring and volume loss. LEFT basilar scarring with overall improved aeration in lower lungs versus previous exam. Minimal chronic RIGHT pleural effusion. No new abnormalities. Electronically Signed   By: Ulyses Southward M.D.   On: 03/23/2021 11:44   PROCEDURES: Critical Care performed: No Procedures MEDICATIONS ORDERED IN ED: Medications  cyclobenzaprine (FLEXERIL) tablet 10 mg (has no administration in time range)  meloxicam (MOBIC) tablet 15 mg (has no administration in time range)   IMPRESSION / MDM / ASSESSMENT AND PLAN / ED COURSE  I reviewed the triage vital signs and the nursing notes.                              The patient is on the cardiac monitor to evaluate for evidence of arrhythmia and/or significant heart rate changes. Patient presents for low back pain. Given History and Exam the patient appears to be at low risk for CHF exacerbation, COPD exacerbation, ACS, spinal Cord Compression Syndrome, Vertebral Malignancy/Mets, acute Spinal Fracture, Vertebral Osteomyelitis, Epidural Abscess, Infected or Obstructing Kidney Stone.  Their presentation appears most likely to be secondary to non-emergent musculoskeletal etiology vs non-emergent disc herniation.  ED Workup: CBC, BMP, BNP, chest x-ray Patient CBC significant for leukocytosis of 17 likely explained by recent steroid use, BNP 59 which is significantly improved from previous CMP significant for mild creatinine elevation of 1.57 from his baseline of 1.1.  Patient states he is still making urine appropriately and was encouraged to follow-up with his primary physician in the next week in order to appropriately trend this creatinine  Given patient's pain being mostly musculoskeletal with movement or palpation, will attempt pain control with cyclobenzaprine and over-the-counter analgesia.  Patient was encouraged to avoid NSAIDs given his renal  function.  Disposition: Discharge. Strict return precautions discussed with patient with full understanding. Advised patient to follow up promptly with primary care provider    FINAL CLINICAL IMPRESSION(S) / ED DIAGNOSES   Final diagnoses:  Lumbar back pain  Chronic obstructive pulmonary disease, unspecified COPD type (HCC)   Rx / DC Orders   ED Discharge Orders          Ordered    cyclobenzaprine (FLEXERIL) 10 MG tablet  3 times daily PRN        03/23/21 1211           Note:  This document was prepared using Dragon voice recognition software and may include unintentional dictation errors.   Merwyn Katos, MD 03/23/21 [phone]

## 2021-03-23 NOTE — Discharge Instructions (Addendum)
You were seen in our emergency department today for evaluation of an acute exacerbation of your chronic back pain was concerning for possible fluid collection around the lungs/heart.  There is no evidence of that here today however there was incidental evidence of mild kidney dysfunction that will need to be followed up with your primary care physician within the next week to ensure this is back to your baseline.

## 2021-03-27 DIAGNOSIS — M62830 Muscle spasm of back: Secondary | ICD-10-CM | POA: Diagnosis not present

## 2021-03-27 DIAGNOSIS — M5134 Other intervertebral disc degeneration, thoracic region: Secondary | ICD-10-CM | POA: Diagnosis not present

## 2021-03-27 DIAGNOSIS — M9903 Segmental and somatic dysfunction of lumbar region: Secondary | ICD-10-CM | POA: Diagnosis not present

## 2021-03-27 DIAGNOSIS — M9902 Segmental and somatic dysfunction of thoracic region: Secondary | ICD-10-CM | POA: Diagnosis not present

## 2021-03-29 ENCOUNTER — Other Ambulatory Visit: Payer: Self-pay | Admitting: Internal Medicine

## 2021-03-29 DIAGNOSIS — M5134 Other intervertebral disc degeneration, thoracic region: Secondary | ICD-10-CM | POA: Diagnosis not present

## 2021-03-29 DIAGNOSIS — M62830 Muscle spasm of back: Secondary | ICD-10-CM | POA: Diagnosis not present

## 2021-03-29 DIAGNOSIS — M9902 Segmental and somatic dysfunction of thoracic region: Secondary | ICD-10-CM | POA: Diagnosis not present

## 2021-03-29 DIAGNOSIS — M9903 Segmental and somatic dysfunction of lumbar region: Secondary | ICD-10-CM | POA: Diagnosis not present

## 2021-04-05 ENCOUNTER — Other Ambulatory Visit: Payer: Self-pay | Admitting: Internal Medicine

## 2021-04-24 ENCOUNTER — Telehealth: Payer: Self-pay | Admitting: Internal Medicine

## 2021-04-24 MED ORDER — LEVALBUTEROL HCL 1.25 MG/3ML IN NEBU: 1.2500 mg | INHALATION_SOLUTION | Freq: Three times a day (TID) | RESPIRATORY_TRACT | 2 refills | Status: DC | PRN

## 2021-04-24 NOTE — Telephone Encounter (Signed)
Patient is aware of below message/recommendations and voiced his understanding.  ?Xopenex sent to preferred pharmacy.  ?Nothing further needed.  ? ?

## 2021-04-24 NOTE — Telephone Encounter (Signed)
May use Xopenex 1.25/62ml, 1 vial 3 times a day as needed for shortness of breath or wheezing. ?

## 2021-04-24 NOTE — Telephone Encounter (Signed)
Dr. Jayme Cloud, please advise if okay to prescribe xopenex.  ? ? ?

## 2021-05-04 ENCOUNTER — Ambulatory Visit (INDEPENDENT_AMBULATORY_CARE_PROVIDER_SITE_OTHER): Payer: Medicare Other

## 2021-05-04 ENCOUNTER — Encounter: Payer: Self-pay | Admitting: Emergency Medicine

## 2021-05-04 ENCOUNTER — Ambulatory Visit
Admission: EM | Admit: 2021-05-04 | Discharge: 2021-05-04 | Disposition: A | Discharge status: Home / Self Care | Payer: Medicare Other | Attending: Physician Assistant | Admitting: Physician Assistant

## 2021-05-04 ENCOUNTER — Other Ambulatory Visit: Payer: Self-pay

## 2021-05-04 DIAGNOSIS — M546 Pain in thoracic spine: Secondary | ICD-10-CM | POA: Diagnosis not present

## 2021-05-04 DIAGNOSIS — M545 Low back pain, unspecified: Secondary | ICD-10-CM | POA: Diagnosis not present

## 2021-05-04 DIAGNOSIS — S22000A Wedge compression fracture of unspecified thoracic vertebra, initial encounter for closed fracture: Secondary | ICD-10-CM | POA: Diagnosis not present

## 2021-05-04 DIAGNOSIS — S32000A Wedge compression fracture of unspecified lumbar vertebra, initial encounter for closed fracture: Secondary | ICD-10-CM | POA: Diagnosis not present

## 2021-05-04 DIAGNOSIS — Z7952 Long term (current) use of systemic steroids: Secondary | ICD-10-CM

## 2021-05-04 DIAGNOSIS — M549 Dorsalgia, unspecified: Secondary | ICD-10-CM | POA: Diagnosis not present

## 2021-05-04 DIAGNOSIS — M8588 Other specified disorders of bone density and structure, other site: Secondary | ICD-10-CM | POA: Diagnosis not present

## 2021-05-04 MED ORDER — LIDOCAINE 5 % EX PTCH: MEDICATED_PATCH | CUTANEOUS | 0 refills | Status: AC

## 2021-05-04 MED ORDER — BACLOFEN 10 MG PO TABS: 10.0000 mg | ORAL_TABLET | Freq: Three times a day (TID) | ORAL | 1 refills | Status: AC | PRN

## 2021-05-04 MED ORDER — TRAMADOL HCL 50 MG PO TABS
50.0000 mg | ORAL_TABLET | Freq: Three times a day (TID) | ORAL | 0 refills | Status: AC | PRN
Start: 2021-05-04 — End: 2021-05-11

## 2021-05-04 NOTE — Discharge Instructions (Signed)
-  You have multiple compression fractures of your spine.  You have the fractures where you are having the pain.  This can lead to pinched nerves and muscle spasms.  The compression fractures are likely a result of chronic corticosteroid use.  This causes lower bone density and also aging is a factor too.  The bone has collapsed resulting in a compression fracture.  See handout for more information.  I have sent medication for pain relief.  Take this if you absolutely need it. ?- I have also sent a muscle relaxer.  Muscle relaxers do have the ability to decrease your urine flow.  If this occurs, cut the dose in half or discontinue it. ?- I have also sent a lidocaine patches. ?- You can take Tylenol as well if you need to and use muscle rubs. ?- You should reach out to one of the orthopedic offices below for an appointment.  I am unsure if they will suggest any surgery, but probably unlikely if you have to continue taking corticosteroids long-term.  They may suggest lifestyle modifications, physical therapy, pain management. ?- You should also reach out to your primary care provider and keep them in the loop about what is going on. ?- Go to emergency department if your back pain acutely worsens. ? ?You have a condition requiring you to follow up with Orthopedics so please call one of the following office for appointment:  ? ?[facility] ?[address], [place], [place] [address] ?Phone: [phone] ? ?[facility] ?[address] [place], [place] [address] ?Phone: [phone]  ?

## 2021-05-04 NOTE — ED Triage Notes (Addendum)
Pt c/o mid back pain. Started a couple of months ago. He states he has been to the ED about it a month ago. He states if he can force himself to do more then his pain is better. Denies injury. He states he has been to the chiropractor without relief. He has been taking flexeril.  ?

## 2021-05-04 NOTE — ED Provider Notes (Signed)
?[facility] ? ? ? ?CSN: [id number] ?Arrival date & time: 05/04/21  1042 ? ? ?  ? ?History   ?Chief Complaint ?Chief Complaint  ?Patient presents with  ? Back Pain  ? ? ?HPI ?Charles Webster is a 76 y.o. male presenting for 7325-month history of mid thoracic back pain and upper lumbar back pain.  Patient reports aching pain.  Reports he gets muscle cramps and spasms that radiate to his upper abdomen.  He has increased pain when sitting for too long and moving certain ways.  He also says the musculature of his back is very tender to touch.  Patient went to the emergency department about a month ago for this back pain.  He does have history of COPD and states he has had back pain before related to fluid in his lungs.  Chest x-ray was performed without any significant results.  He also had lab work.  The physician felt he was having musculoskeletal back pain and prescribed him Flexeril which she says has been very helpful but he cannot take it as prescribed since it slows his flow of urine.  He reports feeling occasional numbness in his abdomen diffusely.  Denies any radiation of pain to his extremities or weakness.  No falls.  Denies ever having any injury or trauma.  As mentioned, patient has a history of COPD.  Denies any recent worsening of his baseline shortness of breath.  He does report that he is on 5 L of oxygen continuously.  States he was infected with COVID-19 8 months ago and since then had increase his oxygen from 2 L to 5 L.  Denies any recent fever, cough, chest pain or wheezing.  Patient also has history of CHF.  No reports of extremity swelling.  Patient has gone to her chiropractor multiple times but has not had improvement in symptoms.  States he moved a strange way the other day and felt a pop in his spine.  He says since then pain has been worse.  His other medical history significant for GERD, CKD, hiatal hernia, diverticulitis, allergies, and partial lobectomy of the right lung.  Long-term  daily corticosteroid use due to severe COPD.  Patient takes 20 mg prednisone daily and has been on that for about 2 years.  No other complaints. ? ?HPI ? ?Past Medical History:  ?Diagnosis Date  ? Bronchitis   ? COPD (chronic obstructive pulmonary disease) (HCC)   ? Diverticulitis   ? GERD (gastroesophageal reflux disease)   ? Hiatal hernia   ? ? ?Patient Active Problem List  ? Diagnosis Date Noted  ? Acute on chronic diastolic CHF (congestive heart failure) (HCC) 11/25/2020  ? Acute on chronic respiratory failure with hypoxia and hypercapnia (HCC) 11/21/2020  ? Anemia 11/21/2020  ? GERD (gastroesophageal reflux disease) 11/21/2020  ? Palliative care by specialist   ? Shortness of breath 09/10/2020  ? COVID-19 virus infection 09/10/2020  ? COPD exacerbation (HCC) 05/05/2016  ? Allergic rhinitis 07/14/2013  ? Duodenitis 12/04/2012  ? Hiatal hernia 12/04/2012  ? ? ?Past Surgical History:  ?Procedure Laterality Date  ? LUNG REMOVAL, PARTIAL  2005  ? removed 20% of right lung  ? ? ? ? ? ?Home Medications   ? ?Prior to Admission medications   ?Medication Sig Start Date End Date Taking? Authorizing Provider  ?albuterol (VENTOLIN HFA) 108 (90 Base) MCG/ACT inhaler Inhale 2 puffs into the lungs every 4 (four) hours as needed for wheezing or shortness of breath. 09/23/20  Yes Esaw Grandchild A, DO  ?baclofen (LIORESAL) 10 MG tablet Take 1 tablet (10 mg total) by mouth 3 (three) times daily as needed for up to 10 days for muscle spasms. 05/04/21 05/14/21 Yes Eusebio Friendly B, PA-C  ?budesonide (PULMICORT) 0.5 MG/2ML nebulizer solution INHALE 1 VIAL TWICE A DAY 02/27/21  Yes Erin Fulling, MD  ?cetirizine (ZYRTEC) 10 MG tablet Take 1 tablet by mouth daily as needed.   Yes [provider]  ?furosemide (LASIX) 40 MG tablet Take 1 tablet (40 mg total) by mouth 2 (two) times daily. 11/25/20  Yes Lurene Shadow, MD  ?lidocaine (LIDODERM) 5 % Apply 1 patch to area of pain daily.  Remove & Discard patch within 12 hours or as  directed 05/04/21  Yes Eusebio Friendly B, PA-C  ?nebivolol (BYSTOLIC) 5 MG tablet Take 0.5 tablets (2.5 mg total) by mouth daily. 11/25/20  Yes Lurene Shadow, MD  ?omeprazole (PRILOSEC) 40 MG capsule Take 1 capsule by mouth 2 (two) times daily. 06/07/20  Yes [provider]  ?OXYGEN Inhale into the lungs. 5 liters   Yes [provider]  ?potassium chloride (KLOR-CON) 10 MEQ tablet Take 1 tablet (10 mEq total) by mouth daily. 11/25/20  Yes Lurene Shadow, MD  ?predniSONE (DELTASONE) 20 MG tablet TAKE 1 TABLET BY MOUTH EVERY DAY WITH BREAKFAST 03/29/21  Yes Erin Fulling, MD  ?traMADol (ULTRAM) 50 MG tablet Take 1 tablet (50 mg total) by mouth every 8 (eight) hours as needed for up to 7 days. 05/04/21 05/11/21 Yes Eusebio Friendly B, PA-C  ?feeding supplement (ENSURE ENLIVE / ENSURE PLUS) LIQD Take 237 mLs by mouth 2 (two) times daily between meals. 09/23/20   Pennie Banter, DO  ?levalbuterol Pauline Aus) 1.25 MG/3ML nebulizer solution Take 1.25 mg by nebulization 3 (three) times daily as needed for wheezing. 04/24/21 04/24/22  Salena Saner, MD  ?predniSONE (DELTASONE) 10 MG tablet Take 2 tablets (20 mg total) by mouth daily with breakfast. 12/14/20   Erin Fulling, MD  ? ? ?Family History ?Family History  ?Problem Relation Age of Onset  ? Heart disease Mother   ? Kidney failure Father   ? ? ?Social History ?Social History  ? ?Tobacco Use  ? Smoking status: Former  ?  Packs/day: 1.00  ?  Years: 40.00  ?  Pack years: 40.00  ?  Types: Cigarettes  ?  Quit date: 10/04/2003  ?  Years since quitting: 17.5  ? Smokeless tobacco: Never  ?Vaping Use  ? Vaping Use: Never used  ?Substance Use Topics  ? Alcohol use: No  ? Drug use: No  ? ? ? ?Allergies   ?Alpha blocker quinazolines, Clindamycin/lincomycin, Doxycycline, Levaquin [levofloxacin], Penicillins, Singulair [montelukast sodium], Sulfa antibiotics, and Tamsulosin ? ? ?Review of Systems ?Review of Systems  ?Constitutional:  Negative for fatigue and fever.   ?Respiratory:  Positive for shortness of breath (chronic but no changes from baseline). Negative for cough.   ?Cardiovascular:  Negative for chest pain, palpitations and leg swelling.  ?Gastrointestinal:  Negative for abdominal pain, diarrhea, nausea and vomiting.  ?Genitourinary:  Negative for difficulty urinating, dysuria, flank pain and hematuria.  ?Musculoskeletal:  Positive for back pain. Negative for gait problem.  ?Skin:  Negative for color change and rash.  ?Neurological:  Positive for numbness (occasional numb feeling of skin of abdomen). Negative for weakness.  ? ? ?Physical Exam ?Triage Vital Signs ?ED Triage Vitals  ?Enc Vitals Group  ?   BP   ?   Pulse   ?  Resp   ?   Temp   ?   Temp src   ?   SpO2   ?   Weight   ?   Height   ?   Head Circumference   ?   Peak Flow   ?   Pain Score   ?   Pain Loc   ?   Pain Edu?   ?   Excl. in GC?   ? ?No data found. ? ?Updated Vital Signs ?BP 110/74 (BP Location: Left Arm)   Pulse 89   Temp 98 ?F (36.7 ?C) (Oral)   Resp 20   Ht 5\' 6"  (1.676 m)   Wt 134 lb 14.7 oz (61.2 kg)   SpO2 99%   BMI 21.78 kg/m?  ?   ? ?Physical Exam ?Vitals and nursing note reviewed.  ?Constitutional:   ?   General: He is not in acute distress. ?   Appearance: Normal appearance. He is well-developed. He is not ill-appearing.  ?   Comments: Patient on 5 L supplemental oxygen in exam room.  No respiratory distress noted.  ?HENT:  ?   Head: Normocephalic and atraumatic.  ?Eyes:  ?   General: No scleral icterus. ?   Conjunctiva/sclera: Conjunctivae normal.  ?Cardiovascular:  ?   Rate and Rhythm: Normal rate and regular rhythm.  ?   Heart sounds: Normal heart sounds.  ?Pulmonary:  ?   Effort: Pulmonary effort is normal. No respiratory distress.  ?   Breath sounds: Normal breath sounds.  ?Abdominal:  ?   Palpations: Abdomen is soft.  ?   Tenderness: There is no abdominal tenderness.  ?Musculoskeletal:  ?   Cervical back: Neck supple.  ?   Thoracic back: Spasms and tenderness present. Decreased  range of motion.  ?   Lumbar back: Tenderness present. Decreased range of motion.  ?     Back: ? ?   Comments: Patient has diffuse tenderness to palpation throughout the lower thoracic spine and upper lumbar spine along with

## 2021-05-10 ENCOUNTER — Telehealth: Payer: Self-pay | Admitting: Internal Medicine

## 2021-05-11 MED ORDER — LEVALBUTEROL HCL 1.25 MG/3ML IN NEBU: 1.2500 mg | INHALATION_SOLUTION | Freq: Three times a day (TID) | RESPIRATORY_TRACT | 2 refills | Status: DC | PRN

## 2021-05-11 NOTE — Telephone Encounter (Signed)
Called pt and he is unable to get his xopenex from [facility].  He was changed from Albuterol to Xopenex because they could not get the Albuterol.  I let him know that I would see if I could find somewhere that has the medication as there is nothing comparable.  He verbalized understanding. ? ?[facility] has a pharmacy [facility] had Xopenex available right now.  I called him back and let him know that I sent an order to them, it would come through the mail.  I told him I did not know how long it would take to get to him.  I provided him with the phone # so he could call them to get an ETA on when he could expect to receive the xopenex.  He verbalized understanding.  Nothing further needed. ?

## 2021-05-18 DIAGNOSIS — Z20822 Contact with and (suspected) exposure to covid-19: Secondary | ICD-10-CM | POA: Diagnosis not present

## 2021-05-19 ENCOUNTER — Telehealth: Payer: Self-pay | Admitting: Internal Medicine

## 2021-05-19 NOTE — Telephone Encounter (Signed)
Spoke to Charles Webster with pharmacy [facility]. Charles Webster stated that pharmacy [facility] does not carry xopenex.   ?Spoke to patient and relayed above message. He stated that he would call around to local pharmacies and have [facility] transfer Rx. ?Nothing further needed at this time.  ? ?

## 2021-05-19 NOTE — Telephone Encounter (Signed)
Spoke to patient. He stated that he has not received Xopenex from [employer].  ?Patient stated that he would call back for [employer]'s contact number, as he would unable to take the number down at that time.  ? ?

## 2021-05-19 NOTE — Telephone Encounter (Signed)
Please see phone note dated 05/19/2021 ?

## 2021-05-22 ENCOUNTER — Other Ambulatory Visit: Payer: Self-pay | Admitting: Internal Medicine

## 2021-05-22 MED ORDER — LEVALBUTEROL HCL 1.25 MG/3ML IN NEBU: 1.2500 mg | INHALATION_SOLUTION | Freq: Three times a day (TID) | RESPIRATORY_TRACT | 2 refills | Status: DC | PRN

## 2021-05-22 NOTE — Telephone Encounter (Signed)
Xopenex sent to [facility].  ?Patient is aware and voiced his understanding.  ?Nothing further needed.  ? ?

## 2021-05-23 ENCOUNTER — Emergency Department: Payer: Medicare Other

## 2021-05-23 ENCOUNTER — Other Ambulatory Visit (HOSPITAL_COMMUNITY): Payer: Self-pay

## 2021-05-23 ENCOUNTER — Other Ambulatory Visit: Payer: Self-pay

## 2021-05-23 ENCOUNTER — Telehealth: Payer: Self-pay | Admitting: Internal Medicine

## 2021-05-23 ENCOUNTER — Emergency Department
Admission: EM | Admit: 2021-05-23 | Discharge: 2021-05-23 | Disposition: A | Discharge status: Home / Self Care | Payer: Medicare Other | Attending: Emergency Medicine | Admitting: Emergency Medicine

## 2021-05-23 DIAGNOSIS — Z043 Encounter for examination and observation following other accident: Secondary | ICD-10-CM | POA: Diagnosis not present

## 2021-05-23 DIAGNOSIS — J984 Other disorders of lung: Secondary | ICD-10-CM | POA: Diagnosis not present

## 2021-05-23 DIAGNOSIS — S32009D Unspecified fracture of unspecified lumbar vertebra, subsequent encounter for fracture with routine healing: Secondary | ICD-10-CM | POA: Insufficient documentation

## 2021-05-23 DIAGNOSIS — S32010B Wedge compression fracture of first lumbar vertebra, initial encounter for open fracture: Secondary | ICD-10-CM | POA: Diagnosis not present

## 2021-05-23 DIAGNOSIS — Y92009 Unspecified place in unspecified non-institutional (private) residence as the place of occurrence of the external cause: Secondary | ICD-10-CM | POA: Insufficient documentation

## 2021-05-23 DIAGNOSIS — S199XXA Unspecified injury of neck, initial encounter: Secondary | ICD-10-CM | POA: Diagnosis not present

## 2021-05-23 DIAGNOSIS — M5030 Other cervical disc degeneration, unspecified cervical region: Secondary | ICD-10-CM | POA: Diagnosis not present

## 2021-05-23 DIAGNOSIS — S22009D Unspecified fracture of unspecified thoracic vertebra, subsequent encounter for fracture with routine healing: Secondary | ICD-10-CM | POA: Insufficient documentation

## 2021-05-23 DIAGNOSIS — G319 Degenerative disease of nervous system, unspecified: Secondary | ICD-10-CM | POA: Diagnosis not present

## 2021-05-23 DIAGNOSIS — S299XXA Unspecified injury of thorax, initial encounter: Secondary | ICD-10-CM | POA: Diagnosis not present

## 2021-05-23 DIAGNOSIS — R0689 Other abnormalities of breathing: Secondary | ICD-10-CM | POA: Diagnosis not present

## 2021-05-23 DIAGNOSIS — J449 Chronic obstructive pulmonary disease, unspecified: Secondary | ICD-10-CM | POA: Diagnosis not present

## 2021-05-23 DIAGNOSIS — S22000D Wedge compression fracture of unspecified thoracic vertebra, subsequent encounter for fracture with routine healing: Secondary | ICD-10-CM

## 2021-05-23 DIAGNOSIS — R000 Tachycardia, unspecified: Secondary | ICD-10-CM | POA: Diagnosis not present

## 2021-05-23 DIAGNOSIS — R52 Pain, unspecified: Secondary | ICD-10-CM | POA: Diagnosis not present

## 2021-05-23 DIAGNOSIS — S32000D Wedge compression fracture of unspecified lumbar vertebra, subsequent encounter for fracture with routine healing: Secondary | ICD-10-CM

## 2021-05-23 DIAGNOSIS — J439 Emphysema, unspecified: Secondary | ICD-10-CM | POA: Diagnosis not present

## 2021-05-23 DIAGNOSIS — S22000A Wedge compression fracture of unspecified thoracic vertebra, initial encounter for closed fracture: Secondary | ICD-10-CM | POA: Diagnosis not present

## 2021-05-23 DIAGNOSIS — S0990XA Unspecified injury of head, initial encounter: Secondary | ICD-10-CM | POA: Diagnosis not present

## 2021-05-23 DIAGNOSIS — I6529 Occlusion and stenosis of unspecified carotid artery: Secondary | ICD-10-CM | POA: Diagnosis not present

## 2021-05-23 DIAGNOSIS — W19XXXA Unspecified fall, initial encounter: Secondary | ICD-10-CM | POA: Insufficient documentation

## 2021-05-23 DIAGNOSIS — S42001A Fracture of unspecified part of right clavicle, initial encounter for closed fracture: Secondary | ICD-10-CM | POA: Insufficient documentation

## 2021-05-23 DIAGNOSIS — S22008D Other fracture of unspecified thoracic vertebra, subsequent encounter for fracture with routine healing: Secondary | ICD-10-CM | POA: Diagnosis not present

## 2021-05-23 DIAGNOSIS — Z9889 Other specified postprocedural states: Secondary | ICD-10-CM | POA: Diagnosis not present

## 2021-05-23 DIAGNOSIS — S4991XA Unspecified injury of right shoulder and upper arm, initial encounter: Secondary | ICD-10-CM | POA: Diagnosis present

## 2021-05-23 LAB — CBC WITH DIFFERENTIAL/PLATELET
Abs Immature Granulocytes: 0.12 10*3/uL — ABNORMAL HIGH (ref 0.00–0.07)
Basophils Absolute: 0 10*3/uL (ref 0.0–0.1)
Basophils Relative: 0 %
Eosinophils Absolute: 0 10*3/uL (ref 0.0–0.5)
Eosinophils Relative: 0 %
HCT: 33.1 % — ABNORMAL LOW (ref 39.0–52.0)
Hemoglobin: 10.4 g/dL — ABNORMAL LOW (ref 13.0–17.0)
Immature Granulocytes: 1 %
Lymphocytes Relative: 4 %
Lymphs Abs: 0.5 10*3/uL — ABNORMAL LOW (ref 0.7–4.0)
MCH: 30.2 pg (ref 26.0–34.0)
MCHC: 31.4 g/dL (ref 30.0–36.0)
MCV: 96.2 fL (ref 80.0–100.0)
Monocytes Absolute: 0.3 10*3/uL (ref 0.1–1.0)
Monocytes Relative: 2 %
Neutro Abs: 11.6 10*3/uL — ABNORMAL HIGH (ref 1.7–7.7)
Neutrophils Relative %: 93 %
Platelets: 293 10*3/uL (ref 150–400)
RBC: 3.44 MIL/uL — ABNORMAL LOW (ref 4.22–5.81)
RDW: 14 % (ref 11.5–15.5)
WBC: 12.4 10*3/uL — ABNORMAL HIGH (ref 4.0–10.5)
nRBC: 0 % (ref 0.0–0.2)

## 2021-05-23 LAB — COMPREHENSIVE METABOLIC PANEL
ALT: 17 U/L (ref 0–44)
AST: 21 U/L (ref 15–41)
Albumin: 3.5 g/dL (ref 3.5–5.0)
Alkaline Phosphatase: 173 U/L — ABNORMAL HIGH (ref 38–126)
Anion gap: 11 (ref 5–15)
BUN: 37 mg/dL — ABNORMAL HIGH (ref 8–23)
CO2: 33 mmol/L — ABNORMAL HIGH (ref 22–32)
Calcium: 9.2 mg/dL (ref 8.9–10.3)
Chloride: 94 mmol/L — ABNORMAL LOW (ref 98–111)
Creatinine, Ser: 1.43 mg/dL — ABNORMAL HIGH (ref 0.61–1.24)
GFR, Estimated: 51 mL/min — ABNORMAL LOW (ref >60)
Glucose, Bld: 129 mg/dL — ABNORMAL HIGH (ref 70–99)
Potassium: 3.8 mmol/L (ref 3.5–5.1)
Sodium: 138 mmol/L (ref 135–145)
Total Bilirubin: 0.8 mg/dL (ref 0.3–1.2)
Total Protein: 6.6 g/dL (ref 6.5–8.1)

## 2021-05-23 MED ORDER — OXYCODONE-ACETAMINOPHEN 5-325 MG PO TABS: 1.0000 | ORAL_TABLET | Freq: Once | ORAL | Status: DC

## 2021-05-23 MED ORDER — HYDROMORPHONE HCL 1 MG/ML IJ SOLN
1.0000 mg | Freq: Once | INTRAMUSCULAR | Status: AC
  Administered 2021-05-23: 1 mg via INTRAVENOUS
  Filled 2021-05-23: qty 1

## 2021-05-23 MED ORDER — LEVALBUTEROL HCL 1.25 MG/3ML IN NEBU: 1.2500 mg | INHALATION_SOLUTION | Freq: Three times a day (TID) | RESPIRATORY_TRACT | 2 refills | Status: DC | PRN

## 2021-05-23 MED ORDER — ONDANSETRON HCL 4 MG/2ML IJ SOLN
4.0000 mg | Freq: Once | INTRAMUSCULAR | Status: AC
  Administered 2021-05-23: 4 mg via INTRAVENOUS
  Filled 2021-05-23: qty 2

## 2021-05-23 MED ORDER — OXYCODONE-ACETAMINOPHEN 5-325 MG PO TABS: 1.0000 | ORAL_TABLET | ORAL | 0 refills | Status: AC | PRN

## 2021-05-23 NOTE — Telephone Encounter (Signed)
Spoke to [place] with [employer] and verified that our office has not previously prescribed combivent per our records. ?He voiced understanding.  ?Nothing further needed.  ? ?

## 2021-05-23 NOTE — Telephone Encounter (Signed)
Patient is aware of below message and voiced his understanding.  Nothing further needed.   

## 2021-05-23 NOTE — ED Notes (Signed)
Blue, green and lavender top sent on pt.  ?

## 2021-05-23 NOTE — ED Triage Notes (Addendum)
Pt to ED via [facility] from home. Pt had an unwitnessed fall going up the first step in his house. Pt reports his left leg gave out and has been giving out on him for the last 3 days. Pt states when he was falling he tried to catch the rail with his right arm and heard a pop. Pt denies blood thinners.  ? ?EMS gave fentanyl in route. 18g LAC. Pt wears 5L [place] chronically.  ? ?EMS VS ?CBG 157 ?BP 120/82 ?HR 90 ?RR 16 ?

## 2021-05-23 NOTE — ED Notes (Addendum)
Lab called to process blood work  ?

## 2021-05-23 NOTE — Telephone Encounter (Signed)
Spoke to patient. ?He stated that Xopenex requires PA.  ? ?PA team, can you guys help with this? ?

## 2021-05-23 NOTE — Discharge Instructions (Signed)
Follow-up with either [facility] or [facility].  Please call for an appointment. ?[facility] does have an office in [place] which may be more convenient for you. ?Wear the sling for comfort.  Apply ice to the right shoulder.  Take the Percocet for pain as needed.  If you are taking the narcotic pain medication you will need to take a stool softener such as Senokot, Colace, or MiraLAX to prevent you from becoming constipated. ?

## 2021-05-23 NOTE — ED Provider Notes (Signed)
? ?[facility] ?Provider Note ? ? ? Event Date/Time  ? First MD Initiated Contact with Patient 05/23/21 1451   ?  (approximate) ? ? ?History  ? ?Fall ? ? ?HPI ? ?Charles Webster is a 76 y.o. male with history of COPD, diverticulitis, GERD, partial lung movable, presents emergency department after a fall.  Patient states he has brittle bones due to the chronic steroid use from his COPD.  Has a compression fracture in his back.  But when the patient fell he felt his arm snap in the shoulder.  States that Anette Riedel broke my clavicle.  Patient is in a sling.  States having a lot of pain.  No chest pain.  No shortness of breath.  Patient did fall backwards.  Denies LOC, numbness or tingling ? ?  ? ? ?Physical Exam  ? ?Triage Vital Signs: ?ED Triage Vitals  ?Enc Vitals Group  ?   BP 05/23/21 1421 104/67  ?   Pulse Rate 05/23/21 1418 93  ?   Resp 05/23/21 1418 20  ?   Temp 05/23/21 1418 98.3 ?F (36.8 ?C)  ?   Temp Source 05/23/21 1418 Oral  ?   SpO2 05/23/21 1418 99 %  ?   Weight 05/23/21 1418 134 lb 14.7 oz (61.2 kg)  ?   Height 05/23/21 1418 5\' 6"  (1.676 m)  ?   Head Circumference --   ?   Peak Flow --   ?   Pain Score 05/23/21 1418 5  ?   Pain Loc --   ?   Pain Edu? --   ?   Excl. in GC? --   ? ? ?Most recent vital signs: ?Vitals:  ? 05/23/21 1630 05/23/21 1700  ?BP: 112/74 140/80  ?Pulse: 82 86  ?Resp: 16 (!) 25  ?Temp:    ?SpO2: 100% 99%  ? ? ? ?General: Awake, no distress.   ?CV:  Good peripheral perfusion. regular rate and  rhythm ?Resp:  Normal effort.  ?Abd:  No distention.   ?Other:  Right clavicle and shoulder are very tender to palpation, decreased range of motion secondary discomfort, spine is tender, left hip is tender, neurovascular is intact ? ? ?ED Results / Procedures / Treatments  ? ?Labs ?(all labs ordered are listed, but only abnormal results are displayed) ?Labs Reviewed  ?COMPREHENSIVE METABOLIC PANEL - Abnormal; Notable for the following components:  ?    Result Value  ? Chloride 94  (*)   ? CO2 33 (*)   ? Glucose, Bld 129 (*)   ? BUN 37 (*)   ? Creatinine, Ser 1.43 (*)   ? Alkaline Phosphatase 173 (*)   ? GFR, Estimated 51 (*)   ? All other components within normal limits  ?CBC WITH DIFFERENTIAL/PLATELET - Abnormal; Notable for the following components:  ? WBC 12.4 (*)   ? RBC 3.44 (*)   ? Hemoglobin 10.4 (*)   ? HCT 33.1 (*)   ? Neutro Abs 11.6 (*)   ? Lymphs Abs 0.5 (*)   ? Abs Immature Granulocytes 0.12 (*)   ? All other components within normal limits  ? ? ? ?EKG ? ? ? ? ?RADIOLOGY ?CT of the head and C-spine, x-ray of the right clavicle, right humerus, T-spine and L-spine ? ? ? ?PROCEDURES: ? ? ?Procedures ? ? ?MEDICATIONS ORDERED IN ED: ?Medications  ?oxyCODONE-acetaminophen (PERCOCET/ROXICET) 5-325 MG per tablet 1 tablet (has no administration in time range)  ?HYDROmorphone (DILAUDID) injection 1  mg (1 mg Intravenous Given 05/23/21 1526)  ?ondansetron [facility]) injection 4 mg (4 mg Intravenous Given 05/23/21 1523)  ?HYDROmorphone (DILAUDID) injection 1 mg (1 mg Intravenous Given 05/23/21 1751)  ? ? ? ?IMPRESSION / MDM / ASSESSMENT AND PLAN / ED COURSE  ?I reviewed the triage vital signs and the nursing notes. ?             ?               ? ?Differential diagnosis includes, but is not limited to, clavicle fracture, head injury, subdural, SAH, C-spine fracture, T-spine fracture, lumbar fracture ? ?I did independently review the x-rays of the right clavicle, right humerus, and pelvis ?I do not see an acute fracture although the clavicle does look displaced.  Confirmed by radiology who states no fracture noted.  Does not comment on placement of the clavicle. ? ?X-ray of the T-spine and L-spine show compression fractures, independently reviewed by me and confirmed by radiology ? ?CT of the head and cervical spine independently reviewed by me.  Read as negative for any acute abnormality by radiology. ? ?Patient continues to have severe pain along the clavicle and anterior chest, will do CT of the  chest without contrast. ? ?Patient was given second dose of Dilaudid, had been given Zofran earlier.  States that Dilaudid did help but has worn off. ? ?CT of the chest without contrast does show a central clavicle fracture.  Confirmed by radiology.  Independently reviewed by me. ? ?The patient is feeling better after second dose of Dilaudid.  I did offer him admission for pain control.  Patient states he would rather just be in pain at home.  He was given a prescription of Percocet for pain.  Instructed take stool softener if taking the narcotic pain medicine.  Apply ice to the right shoulder.  Wear the sling For comfort.  Follow-up with orthopedics.  Patient and his daughter are in agreement with treatment plan.  He was discharged in stable condition. ? ? ?  ? ? ?FINAL CLINICAL IMPRESSION(S) / ED DIAGNOSES  ? ?Final diagnoses:  ?Closed displaced fracture of right clavicle, unspecified part of clavicle, initial encounter  ?Fall, initial encounter  ?Closed compression fracture of lumbosacral spine with routine healing, subsequent encounter  ?Compression fracture of thoracic vertebra with routine healing, subsequent encounter  ? ? ? ?Rx / DC Orders  ? ?ED Discharge Orders   ? ?      Ordered  ?  levalbuterol (XOPENEX) 1.25 MG/3ML nebulizer solution  3 times daily PRN       ?Note to Pharmacy: Diagnosis code:  J44.9  ? 05/23/21 1843  ?  oxyCODONE-acetaminophen (PERCOCET) 5-325 MG tablet  Every 4 hours PRN       ? 05/23/21 1900  ? ?  ?  ? ?  ? ? ? ?Note:  This document was prepared using Dragon voice recognition software and may include unintentional dictation errors. ? ?  ?Faythe Ghee, PA-C ?05/23/21 1901 ? ?  ?Gilles Chiquito, MD ?05/24/21 0124 ? ?

## 2021-06-15 DIAGNOSIS — G8929 Other chronic pain: Secondary | ICD-10-CM | POA: Diagnosis not present

## 2021-06-15 DIAGNOSIS — M4807 Spinal stenosis, lumbosacral region: Secondary | ICD-10-CM | POA: Diagnosis not present

## 2021-06-15 DIAGNOSIS — M898X1 Other specified disorders of bone, shoulder: Secondary | ICD-10-CM | POA: Diagnosis not present

## 2021-06-15 DIAGNOSIS — S42024A Nondisplaced fracture of shaft of right clavicle, initial encounter for closed fracture: Secondary | ICD-10-CM | POA: Diagnosis not present

## 2021-06-15 DIAGNOSIS — M5442 Lumbago with sciatica, left side: Secondary | ICD-10-CM | POA: Diagnosis not present

## 2021-06-15 DIAGNOSIS — Z8781 Personal history of (healed) traumatic fracture: Secondary | ICD-10-CM | POA: Diagnosis not present

## 2021-06-16 ENCOUNTER — Other Ambulatory Visit (HOSPITAL_COMMUNITY): Payer: Self-pay | Admitting: Orthopedic Surgery

## 2021-06-16 ENCOUNTER — Other Ambulatory Visit: Payer: Self-pay | Admitting: Orthopedic Surgery

## 2021-06-16 DIAGNOSIS — G8929 Other chronic pain: Secondary | ICD-10-CM

## 2021-06-16 DIAGNOSIS — M4807 Spinal stenosis, lumbosacral region: Secondary | ICD-10-CM

## 2021-06-19 ENCOUNTER — Other Ambulatory Visit: Payer: Self-pay | Admitting: Internal Medicine

## 2021-06-22 ENCOUNTER — Ambulatory Visit
Admission: RE | Admit: 2021-06-22 | Discharge: 2021-06-22 | Disposition: A | Discharge status: Home / Self Care | Payer: Medicare Other | Source: Ambulatory Visit | Attending: Orthopedic Surgery | Admitting: Orthopedic Surgery

## 2021-06-22 DIAGNOSIS — S32010A Wedge compression fracture of first lumbar vertebra, initial encounter for closed fracture: Secondary | ICD-10-CM | POA: Diagnosis not present

## 2021-06-22 DIAGNOSIS — M5442 Lumbago with sciatica, left side: Secondary | ICD-10-CM | POA: Insufficient documentation

## 2021-06-22 DIAGNOSIS — G8929 Other chronic pain: Secondary | ICD-10-CM | POA: Diagnosis not present

## 2021-06-22 DIAGNOSIS — M4807 Spinal stenosis, lumbosacral region: Secondary | ICD-10-CM | POA: Insufficient documentation

## 2021-07-05 ENCOUNTER — Ambulatory Visit: Payer: Medicare Other | Admitting: Internal Medicine

## 2021-07-10 DIAGNOSIS — D72829 Elevated white blood cell count, unspecified: Secondary | ICD-10-CM | POA: Diagnosis not present

## 2021-07-12 DIAGNOSIS — J9611 Chronic respiratory failure with hypoxia: Secondary | ICD-10-CM | POA: Diagnosis not present

## 2021-07-12 DIAGNOSIS — K219 Gastro-esophageal reflux disease without esophagitis: Secondary | ICD-10-CM | POA: Diagnosis not present

## 2021-07-12 DIAGNOSIS — J449 Chronic obstructive pulmonary disease, unspecified: Secondary | ICD-10-CM | POA: Diagnosis not present

## 2021-07-12 DIAGNOSIS — I5033 Acute on chronic diastolic (congestive) heart failure: Secondary | ICD-10-CM | POA: Diagnosis not present

## 2021-07-12 DIAGNOSIS — N1831 Chronic kidney disease, stage 3a: Secondary | ICD-10-CM | POA: Diagnosis not present

## 2021-07-12 DIAGNOSIS — I25118 Atherosclerotic heart disease of native coronary artery with other forms of angina pectoris: Secondary | ICD-10-CM | POA: Diagnosis not present

## 2021-07-12 DIAGNOSIS — L720 Epidermal cyst: Secondary | ICD-10-CM | POA: Diagnosis not present

## 2021-07-18 DIAGNOSIS — I2510 Atherosclerotic heart disease of native coronary artery without angina pectoris: Secondary | ICD-10-CM | POA: Diagnosis not present

## 2021-07-18 DIAGNOSIS — I10 Essential (primary) hypertension: Secondary | ICD-10-CM | POA: Diagnosis not present

## 2021-07-18 DIAGNOSIS — I5032 Chronic diastolic (congestive) heart failure: Secondary | ICD-10-CM | POA: Diagnosis not present

## 2021-07-19 ENCOUNTER — Telehealth: Payer: Self-pay | Admitting: *Deleted

## 2021-07-19 ENCOUNTER — Other Ambulatory Visit: Payer: Self-pay | Admitting: Internal Medicine

## 2021-07-19 NOTE — Telephone Encounter (Signed)
Patient called to request a refill on his Albuterol for his nebulizer.  He stated he search all over and the only pharmacy that has it [facility].  Patient would like a prescription called in as soon as possible.  Please advise.

## 2021-07-19 NOTE — Telephone Encounter (Signed)
Spoke to patient.  He would like to switch back to Duoneb from xopenex. [facility] [address] has some in stock.  Dr. Belia Heman, please advise. Thanks.

## 2021-07-27 MED ORDER — IPRATROPIUM-ALBUTEROL 0.5-2.5 (3) MG/3ML IN SOLN: 3.0000 mL | Freq: Four times a day (QID) | RESPIRATORY_TRACT | 2 refills | Status: DC | PRN

## 2021-07-27 NOTE — Telephone Encounter (Signed)
Patient is aware of below message and voiced his understanding.  Duoneb has been sent to preferred pharmacy. Nothing further needed.

## 2021-08-08 ENCOUNTER — Telehealth: Payer: Self-pay | Admitting: Internal Medicine

## 2021-08-08 MED ORDER — IPRATROPIUM-ALBUTEROL 0.5-2.5 (3) MG/3ML IN SOLN
3.0000 mL | Freq: Four times a day (QID) | RESPIRATORY_TRACT | 2 refills | Status: DC | PRN
Start: 2021-08-08 — End: 2021-10-03

## 2021-08-08 NOTE — Telephone Encounter (Signed)
Duoneb has been sent to preferred pharmacy with dx code.  Patient is aware and voiced his understanding.  Nothing further needed.

## 2021-08-23 ENCOUNTER — Other Ambulatory Visit: Payer: Self-pay | Admitting: Internal Medicine

## 2021-09-04 ENCOUNTER — Other Ambulatory Visit: Payer: Self-pay | Admitting: Internal Medicine

## 2021-09-15 DIAGNOSIS — H353132 Nonexudative age-related macular degeneration, bilateral, intermediate dry stage: Secondary | ICD-10-CM | POA: Diagnosis not present

## 2021-10-03 ENCOUNTER — Encounter: Payer: Self-pay | Admitting: Internal Medicine

## 2021-10-03 ENCOUNTER — Ambulatory Visit (INDEPENDENT_AMBULATORY_CARE_PROVIDER_SITE_OTHER): Payer: Medicare Other | Admitting: Internal Medicine

## 2021-10-03 VITALS — BP 122/68 | HR 104 | Temp 98.3°F | Ht 66.0 in | Wt 130.2 lb

## 2021-10-03 DIAGNOSIS — R0689 Other abnormalities of breathing: Secondary | ICD-10-CM | POA: Diagnosis not present

## 2021-10-03 DIAGNOSIS — J9611 Chronic respiratory failure with hypoxia: Secondary | ICD-10-CM | POA: Diagnosis not present

## 2021-10-03 DIAGNOSIS — J449 Chronic obstructive pulmonary disease, unspecified: Secondary | ICD-10-CM | POA: Diagnosis not present

## 2021-10-03 DIAGNOSIS — H2511 Age-related nuclear cataract, right eye: Secondary | ICD-10-CM | POA: Diagnosis not present

## 2021-10-03 MED ORDER — IPRATROPIUM-ALBUTEROL 0.5-2.5 (3) MG/3ML IN SOLN: 3.0000 mL | Freq: Four times a day (QID) | RESPIRATORY_TRACT | 10 refills | Status: DC | PRN

## 2021-10-03 NOTE — Patient Instructions (Signed)
Continue oxygen as prescribed Continue prednisone as prescribed Continue nebulizers as prescribed

## 2021-10-03 NOTE — Progress Notes (Addendum)
Name: Charles Webster MRN: [id number] DOB: 06/04/1945     CONSULTATION DATE: 5.9.19 REFERRING MD : Quentin Cornwall  STUDIES:   4.7.18   CXR independently reviewed by Me  Increased lung volumes No pneumonia No effusions  Previous HISTORY OF PRESENT ILLNESS: 76 year old pleasant white male seen today for assessment for COPD Patient states he has been diagnosed with COPD for approximately 2 years ago however based on further questioning patient has chronic shortness of breath and progressive dyspnea exertion for the past 5 years  Patient has a history of tobacco abuse 1 pack a day for 40 years Patient worked at the [employer] and was exposed to paints 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  Patient states 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 at [facility]  09/2020 COVID infection and ICU admission for COPD and CHF exacerbation  CC Follow-up severe COPD   HPI Severe COPD follow-up today Chronic shortness of breath and dyspnea on exertion Previous admission for COVID infection and heart failure exacerbation last year Done Really well with pulmonary rehab 5 L of oxygen now Previous history of multiple bouts of COPD exacerbation   Patient has been switched to nebulized therapy Pulmicort twice daily DuoNebs every 4 This regimen seems to help him however he is an active COPD exacerbation   No exacerbation at this time No evidence of heart failure at this time No evidence or signs of infection at this time No respiratory distress No fevers, chills, nausea, vomiting, diarrhea No evidence of lower extremity edema No evidence hemoptysis   Stopped taking traditional inhaler therapy due to poor respiratory insufficiency  Patient uses and benefits oxygen therapy He needs this for survival  Patient has failed all types of inhaler therapy as well as albuterol neb therapy  Outpatient Encounter  Medications as of 10/03/2021  Medication Sig   albuterol (VENTOLIN HFA) 108 (90 Base) MCG/ACT inhaler Inhale 2 puffs into the lungs every 4 (four) hours as needed for wheezing or shortness of breath.   budesonide (PULMICORT) 0.5 MG/2ML nebulizer solution INHALE 1 VIAL TWICE A DAY   cetirizine (ZYRTEC) 10 MG tablet Take 1 tablet by mouth daily as needed.   feeding supplement (ENSURE ENLIVE / ENSURE PLUS) LIQD Take 237 mLs by mouth 2 (two) times daily between meals.   furosemide (LASIX) 40 MG tablet Take 1 tablet (40 mg total) by mouth 2 (two) times daily.   ipratropium-albuterol (DUONEB) 0.5-2.5 (3) MG/3ML SOLN Take 3 mLs by nebulization every 6 (six) hours as needed.   lidocaine (LIDODERM) 5 % Apply 1 patch to area of pain daily.  Remove & Discard patch within 12 hours or as directed   nebivolol (BYSTOLIC) 5 MG tablet Take 0.5 tablets (2.5 mg total) by mouth daily.   omeprazole (PRILOSEC) 40 MG capsule Take 1 capsule by mouth 2 (two) times daily.   oxyCODONE-acetaminophen (PERCOCET) 5-325 MG tablet Take 1-2 tablets by mouth every 4 (four) hours as needed for severe pain.   OXYGEN Inhale into the lungs. 5 liters   potassium chloride (KLOR-CON) 10 MEQ tablet Take 1 tablet (10 mEq total) by mouth daily.   predniSONE (DELTASONE) 20 MG tablet TAKE 1 TABLET BY MOUTH EVERY DAY WITH BREAKFAST   [DISCONTINUED] predniSONE (DELTASONE) 10 MG tablet TAKE 2 TABLETS BY MOUTH DAILY WITH BREAKFAST.   No facility-administered encounter medications on file as of 10/03/2021.      Review of Systems:  Gen:  Denies  fever, sweats, chills weight loss  HEENT: Denies blurred vision, double vision, ear pain, eye pain, hearing loss, nose bleeds, sore throat Cardiac:  No dizziness, chest pain or heaviness, chest tightness,edema, No JVD Resp:   No cough, -sputum production, +shortness of breath,+wheezing, -hemoptysis,  Other:  All other systems negative  BP 122/68 (BP Location: Left Arm, Cuff Size: Normal)   Pulse (!)  104   Temp 98.3 F (36.8 C) (Temporal)   Ht 5\' 6"  (1.676 m)   Wt 130 lb 3.2 oz (59.1 kg)   SpO2 91%   BMI 21.01 kg/m    Physical Examination:   General Appearance: No distress  EYES PERRLA, EOM intact.   NECK Supple, No JVD Pulmonary: normal breath sounds, No wheezing.  CardiovascularNormal S1,S2.  No m/r/g.   Abdomen: Benign, Soft, non-tender. ALL OTHER ROS ARE NEGATIVE     ASSESSMENT / PLAN:  76 year old pleasant white male with end-stage COPD Gold stage D  Patient with recurrent bouts of COPD exacerbation, associated with chronic hypoxic respiratory failure respiratory insufficiency  End-stage COPD Gold stage D Continue nebulized therapy as maintenance therapy Patient would like to continue with longstanding prednisone therapy Avoid secondhand smoke Continue oxygen as prescribed  Will consider adding Daliresp(PD4 inh) to regimen and assess resp status  Chronic hypoxic respiratory failure from COPD Patient uses and benefits from oxygen therapy  Patient needs this for survival    MEDICATION ADJUSTMENTS/LABS AND TESTS ORDERED: Continue oxygen as prescribed Continue prednisone as prescribed Continue nebulizers as prescribed   CURRENT MEDICATIONS REVIEWED AT LENGTH WITH PATIENT TODAY   Patient satisfied with Plan of action and management. All questions answered   Follow-up in 6 months   Total time spent 24 minutes   [address] Bamberg, M.D.  [facility]  Medical Director [facility] Medical Director [facility] Cardio-Pulmonary Department

## 2021-10-06 ENCOUNTER — Other Ambulatory Visit (HOSPITAL_COMMUNITY): Payer: Self-pay

## 2021-10-06 ENCOUNTER — Telehealth: Payer: Self-pay

## 2021-10-06 MED ORDER — ROFLUMILAST 500 MCG PO TABS: 500.0000 ug | ORAL_TABLET | Freq: Every day | ORAL | 3 refills | Status: DC

## 2021-10-06 NOTE — Telephone Encounter (Signed)
Patient Advocate Encounter  Prior Authorization for Roflumilast tablets has been approved.    Effective: 10-06-2021 to 01-28-2022   Test claim returns a $450.87 co-pay

## 2021-10-06 NOTE — Addendum Note (Signed)
Addended by: Erin Fulling on: 10/06/2021 11:19 AM   Modules accepted: Orders

## 2021-10-06 NOTE — Telephone Encounter (Signed)
Patient Advocate Encounter   Received notification from [employer] that prior authorization is required for Roflumilast tablets  Submitted: 10-06-2021 Key [id number]

## 2021-10-08 ENCOUNTER — Other Ambulatory Visit: Payer: Self-pay | Admitting: Pulmonary Disease

## 2021-10-10 ENCOUNTER — Encounter: Payer: Self-pay | Admitting: Ophthalmology

## 2021-10-11 NOTE — Discharge Instructions (Signed)

## 2021-10-16 ENCOUNTER — Ambulatory Visit (AMBULATORY_SURGERY_CENTER): Payer: Medicare Other | Admitting: General Practice

## 2021-10-16 ENCOUNTER — Other Ambulatory Visit: Payer: Self-pay

## 2021-10-16 ENCOUNTER — Encounter
Admission: RE | Disposition: A | Discharge status: Home / Self Care | Payer: Self-pay | Source: Ambulatory Visit | Attending: Ophthalmology

## 2021-10-16 ENCOUNTER — Ambulatory Visit
Admission: RE | Admit: 2021-10-16 | Discharge: 2021-10-16 | Disposition: A | Discharge status: Home / Self Care | Payer: Medicare Other | Source: Ambulatory Visit | Attending: Ophthalmology | Admitting: Ophthalmology

## 2021-10-16 ENCOUNTER — Ambulatory Visit: Payer: Medicare Other | Admitting: General Practice

## 2021-10-16 ENCOUNTER — Encounter: Payer: Self-pay | Admitting: Ophthalmology

## 2021-10-16 DIAGNOSIS — H2511 Age-related nuclear cataract, right eye: Secondary | ICD-10-CM | POA: Diagnosis not present

## 2021-10-16 DIAGNOSIS — J449 Chronic obstructive pulmonary disease, unspecified: Secondary | ICD-10-CM | POA: Insufficient documentation

## 2021-10-16 DIAGNOSIS — I509 Heart failure, unspecified: Secondary | ICD-10-CM | POA: Diagnosis not present

## 2021-10-16 DIAGNOSIS — K449 Diaphragmatic hernia without obstruction or gangrene: Secondary | ICD-10-CM | POA: Diagnosis not present

## 2021-10-16 DIAGNOSIS — Z87891 Personal history of nicotine dependence: Secondary | ICD-10-CM

## 2021-10-16 DIAGNOSIS — K219 Gastro-esophageal reflux disease without esophagitis: Secondary | ICD-10-CM | POA: Diagnosis not present

## 2021-10-16 DIAGNOSIS — Z9981 Dependence on supplemental oxygen: Secondary | ICD-10-CM | POA: Insufficient documentation

## 2021-10-16 HISTORY — PX: CATARACT EXTRACTION W/PHACO: SHX586

## 2021-10-16 HISTORY — DX: Heart failure, unspecified: I50.9

## 2021-10-16 HISTORY — DX: Presence of dental prosthetic device (complete) (partial): Z97.2

## 2021-10-16 HISTORY — DX: Dependence on supplemental oxygen: Z99.81

## 2021-10-16 SURGERY — PHACOEMULSIFICATION, CATARACT, WITH IOL INSERTION
Anesthesia: Topical | Site: Eye | Laterality: Right

## 2021-10-16 MED ORDER — ACETAMINOPHEN 500 MG PO TABS: 1000.0000 mg | ORAL_TABLET | Freq: Once | ORAL | Status: DC | PRN

## 2021-10-16 MED ORDER — TETRACAINE HCL 0.5 % OP SOLN
1.0000 [drp] | OPHTHALMIC | Status: DC | PRN
  Administered 2021-10-16 (×3): 1 [drp] via OPHTHALMIC

## 2021-10-16 MED ORDER — ARMC OPHTHALMIC DILATING DROPS
1.0000 | OPHTHALMIC | Status: DC | PRN
  Administered 2021-10-16 (×3): 1 via OPHTHALMIC

## 2021-10-16 MED ORDER — FENTANYL CITRATE (PF) 100 MCG/2ML IJ SOLN
INTRAMUSCULAR | Status: DC | PRN
  Administered 2021-10-16: 50 ug via INTRAVENOUS

## 2021-10-16 MED ORDER — SIGHTPATH DOSE#1 BSS IO SOLN
INTRAOCULAR | Status: DC | PRN
  Administered 2021-10-16: 121 mL via OPHTHALMIC

## 2021-10-16 MED ORDER — SIGHTPATH DOSE#1 BSS IO SOLN
INTRAOCULAR | Status: DC | PRN
  Administered 2021-10-16: 15 mL

## 2021-10-16 MED ORDER — SIGHTPATH DOSE#1 NA HYALUR & NA CHOND-NA HYALUR IO KIT
PACK | INTRAOCULAR | Status: DC | PRN
  Administered 2021-10-16: 1 via OPHTHALMIC

## 2021-10-16 MED ORDER — LACTATED RINGERS IV SOLN: INTRAVENOUS | Status: DC

## 2021-10-16 MED ORDER — LIDOCAINE HCL (PF) 2 % IJ SOLN
INTRAOCULAR | Status: DC | PRN
  Administered 2021-10-16: 1 mL via INTRAOCULAR

## 2021-10-16 MED ORDER — ONDANSETRON HCL 4 MG/2ML IJ SOLN: 4.0000 mg | Freq: Once | INTRAMUSCULAR | Status: DC | PRN

## 2021-10-16 MED ORDER — MOXIFLOXACIN HCL 0.5 % OP SOLN
OPHTHALMIC | Status: DC | PRN
  Administered 2021-10-16: 0.2 mL via OPHTHALMIC

## 2021-10-16 MED ORDER — ACETAMINOPHEN 160 MG/5ML PO SOLN: 975.0000 mg | Freq: Once | ORAL | Status: DC | PRN

## 2021-10-16 MED ORDER — BRIMONIDINE TARTRATE-TIMOLOL 0.2-0.5 % OP SOLN
OPHTHALMIC | Status: DC | PRN
  Administered 2021-10-16: 1 [drp] via OPHTHALMIC

## 2021-10-16 MED ORDER — MIDAZOLAM HCL 2 MG/2ML IJ SOLN
INTRAMUSCULAR | Status: DC | PRN
  Administered 2021-10-16: 1 mg via INTRAVENOUS

## 2021-10-16 SURGICAL SUPPLY — 19 items
CANNULA ANT/CHMB 27G (MISCELLANEOUS) IMPLANT
CANNULA ANT/CHMB 27GA (MISCELLANEOUS) IMPLANT
CATARACT SUITE SIGHTPATH (MISCELLANEOUS) ×1 IMPLANT
DISSECTOR HYDRO NUCLEUS 50X22 (MISCELLANEOUS) ×1 IMPLANT
FEE CATARACT SUITE SIGHTPATH (MISCELLANEOUS) ×1 IMPLANT
GLOVE SURG GAMMEX PI TX LF 7.5 (GLOVE) ×1 IMPLANT
GLOVE SURG SYN 8.5  E (GLOVE) ×1
GLOVE SURG SYN 8.5 E (GLOVE) ×1 IMPLANT
GLOVE SURG SYN 8.5 PF PI (GLOVE) ×1 IMPLANT
LENS IOL TECNIS EYHANCE 20.0 (Intraocular Lens) IMPLANT
NDL FILTER BLUNT 18X1 1/2 (NEEDLE) ×1 IMPLANT
NEEDLE FILTER BLUNT 18X1 1/2 (NEEDLE) ×1 IMPLANT
PACK VIT ANT 23G (MISCELLANEOUS) IMPLANT
RING MALYGIN (MISCELLANEOUS) IMPLANT
SUT ETHILON 10-0 CS-B-6CS-B-6 (SUTURE)
SUTURE EHLN 10-0 CS-B-6CS-B-6 (SUTURE) IMPLANT
SYR 3ML LL SCALE MARK (SYRINGE) ×1 IMPLANT
SYR 5ML LL (SYRINGE) ×1 IMPLANT
WATER STERILE IRR 250ML POUR (IV SOLUTION) ×1 IMPLANT

## 2021-10-16 NOTE — Anesthesia Preprocedure Evaluation (Addendum)
Anesthesia Evaluation    Airway Mallampati: II  TM Distance: >3 FB Neck ROM: Full    Dental no notable dental hx.    Pulmonary shortness of breath, COPD,  oxygen dependent, former smoker,    Pulmonary exam normal breath sounds clear to auscultation       Cardiovascular +CHF  Normal cardiovascular exam Rhythm:Regular Rate:Normal  2022 TTE - 1. Left ventricular ejection fraction, by estimation, is 55 to 60%. The  left ventricle has normal function. Left ventricular endocardial border  not optimally defined to evaluate regional wall motion. Left ventricular  diastolic parameters are consistent  with Grade I diastolic dysfunction (impaired relaxation).  2. Right ventricular systolic function is normal. The right ventricular  size is normal. Tricuspid regurgitation signal is inadequate for assessing  PA pressure.  3. The mitral valve was not well visualized. No evidence of mitral valve  regurgitation. No evidence of mitral stenosis.  4. The aortic valve was not well visualized. Aortic valve regurgitation  is not visualized. No aortic stenosis is present.  5. The inferior vena cava is dilated in size with >50% respiratory  variability, suggesting right atrial pressure of 8 mmHg.  6. Challenging image quality.    Neuro/Psych  Neuromuscular disease    GI/Hepatic hiatal hernia, GERD  ,  Endo/Other    Renal/GU      Musculoskeletal   Abdominal   Peds  Hematology  (+) Blood dyscrasia, anemia ,   Anesthesia Other Findings   Reproductive/Obstetrics                            Anesthesia Physical Anesthesia Plan  ASA: 4  Anesthesia Plan:    Post-op Pain Management:    Induction:   PONV Risk Score and Plan:   Airway Management Planned:   Additional Equipment:   Intra-op Plan:   Post-operative Plan:   Informed Consent:   Plan Discussed with:   Anesthesia Plan Comments:         Anesthesia Quick Evaluation

## 2021-10-16 NOTE — H&P (Signed)
[facility]   Primary Care Physician:  Sofie Hartigan, MD Ophthalmologist: Dr. Benay Pillow  Pre-Procedure History & Physical: HPI:  Charles Webster is a 76 y.o. male here for cataract surgery.   Past Medical History:  Diagnosis Date   Bronchitis    CHF (congestive heart failure) (HCC)    COPD (chronic obstructive pulmonary disease) (HCC)    Diverticulitis    GERD (gastroesophageal reflux disease)    Hiatal hernia    Oxygen dependent    5L [place] continuous   Wears dentures    full upper, partial lower.  do not fit well    Past Surgical History:  Procedure Laterality Date   LUNG REMOVAL, PARTIAL  2005   removed 20% of right lung    Prior to Admission medications   Medication Sig Start Date End Date Taking? Authorizing Provider  albuterol (VENTOLIN HFA) 108 (90 Base) MCG/ACT inhaler Inhale 2 puffs into the lungs every 4 (four) hours as needed for wheezing or shortness of breath. 09/23/20  Yes Nicole Kindred A, DO  budesonide (PULMICORT) 0.5 MG/2ML nebulizer solution INHALE 1 VIAL TWICE A DAY 02/27/21  Yes Kasa, Maretta Bees, MD  cetirizine (ZYRTEC) 10 MG tablet Take 1 tablet by mouth daily as needed.   Yes [provider]  furosemide (LASIX) 40 MG tablet Take 1 tablet (40 mg total) by mouth 2 (two) times daily. 11/25/20  Yes Jennye Boroughs, MD  ipratropium-albuterol (DUONEB) 0.5-2.5 (3) MG/3ML SOLN Take 3 mLs by nebulization every 6 (six) hours as needed. 10/03/21  Yes Kasa, Maretta Bees, MD  nebivolol (BYSTOLIC) 5 MG tablet Take 0.5 tablets (2.5 mg total) by mouth daily. 11/25/20  Yes Jennye Boroughs, MD  omeprazole (PRILOSEC) 40 MG capsule Take 1 capsule by mouth 2 (two) times daily. 06/07/20  Yes [provider]  OXYGEN Inhale into the lungs. 5 liters   Yes [provider]  potassium chloride (KLOR-CON) 10 MEQ tablet Take 1 tablet (10 mEq total) by mouth daily. 11/25/20  Yes Jennye Boroughs, MD  predniSONE (DELTASONE) 20 MG tablet TAKE 1 TABLET BY MOUTH EVERY  DAY WITH BREAKFAST 08/23/21  Yes Kasa, Maretta Bees, MD  roflumilast (DALIRESP) 500 MCG TABS tablet Take 1 tablet (500 mcg total) by mouth daily. 10/06/21  Yes Flora Lipps, MD  feeding supplement (ENSURE ENLIVE / ENSURE PLUS) LIQD Take 237 mLs by mouth 2 (two) times daily between meals. Patient not taking: Reported on 10/10/2021 09/23/20   Nicole Kindred A, DO  lidocaine (LIDODERM) 5 % Apply 1 patch to area of pain daily.  Remove & Discard patch within 12 hours or as directed Patient not taking: Reported on 10/10/2021 05/04/21   Danton Clap, PA-C  oxyCODONE-acetaminophen (PERCOCET) 5-325 MG tablet Take 1-2 tablets by mouth every 4 (four) hours as needed for severe pain. Patient not taking: Reported on 10/10/2021 05/23/21 05/23/22  Versie Starks, PA-C    Allergies as of 09/18/2021 - Review Complete 05/23/2021  Allergen Reaction Noted   Alpha blocker quinazolines  08/10/2014   Clindamycin/lincomycin Nausea Only 08/10/2014   Doxycycline Other (See Comments) 07/14/2013   Levaquin [levofloxacin]  08/10/2014   Penicillins  08/10/2014   Singulair [montelukast sodium] Other (See Comments) 12/13/2017   Sulfa antibiotics Other (See Comments) 07/14/2013   Tamsulosin Other (See Comments) 07/14/2013    Family History  Problem Relation Age of Onset   Heart disease Mother    Kidney failure Father     Social History   Socioeconomic History   Marital  status: Divorced    Spouse name: Not on file   Number of children: Not on file   Years of education: Not on file   Highest education level: Not on file  Occupational History   Not on file  Tobacco Use   Smoking status: Former    Packs/day: 1.00    Years: 40.00    Total pack years: 40.00    Types: Cigarettes    Quit date: 10/04/2003    Years since quitting: 18.0   Smokeless tobacco: Never  Vaping Use   Vaping Use: Never used  Substance and Sexual Activity   Alcohol use: No   Drug use: No   Sexual activity: Not on file  Other Topics Concern   Not  on file  Social History Narrative   Not on file   Social Determinants of Health   Financial Resource Strain: Not on file  Food Insecurity: Not on file  Transportation Needs: Not on file  Physical Activity: Not on file  Stress: Not on file  Social Connections: Not on file  Intimate Partner Violence: Not on file    Review of Systems: See HPI, otherwise negative ROS  Physical Exam: BP (!) 125/91   Pulse 90   Temp 97.6 F (36.4 C) (Temporal)   Resp 18   Ht 5\' 6"  (1.676 m)   Wt 59.4 kg   SpO2 99%   BMI 21.14 kg/m  General:   Alert, cooperative in NAD Head:  Normocephalic and atraumatic. Respiratory:  Normal work of breathing. Cardiovascular:  RRR  Impression/Plan: Charles Webster is here for cataract surgery.  Risks, benefits, limitations, and alternatives regarding cataract surgery have been reviewed with the patient.  Questions have been answered.  All parties agreeable.   [name], MD  10/16/2021, 8:21 AM

## 2021-10-16 NOTE — Op Note (Signed)
OPERATIVE NOTE  EXCELL NEYLAND [id number] 10/16/2021   PREOPERATIVE DIAGNOSIS:  Nuclear sclerotic cataract right eye.  H25.11   POSTOPERATIVE DIAGNOSIS:    Nuclear sclerotic cataract right eye.     PROCEDURE:  Phacoemusification with posterior chamber intraocular lens placement of the right eye   LENS:   Implant Name Type Inv. Item Serial No. Manufacturer Lot No. LRB No. Used Action  LENS IOL TECNIS EYHANCE 20.0 - [id number] Intraocular Lens LENS IOL TECNIS EYHANCE 20.0 [id number] [employer]  Right 1 Implanted       Procedure(s) with comments: CATARACT EXTRACTION PHACO AND INTRAOCULAR LENS PLACEMENT (IOC) RIGHT (Right) - 13.19 1:04.3  DIB00 +20.0   ULTRASOUND TIME: 1 minutes 04 seconds.  CDE 13.19   SURGEON:  Benay Pillow, MD, MPH  ANESTHESIOLOGIST: Anesthesiologist: April Manson, MD CRNA: Louanne Belton, CRNA   ANESTHESIA:  Topical with tetracaine drops augmented with 1% preservative-free intracameral lidocaine.  ESTIMATED BLOOD LOSS: less than 1 mL.   COMPLICATIONS:  None.   DESCRIPTION OF PROCEDURE:  The patient was identified in the holding room and transported to the operating room and placed in the supine position under the operating microscope.  The right eye was identified as the operative eye and it was prepped and draped in the usual sterile ophthalmic fashion.   A 1.0 millimeter clear-corneal paracentesis was made at the 10:30 position. 0.5 ml of preservative-free 1% lidocaine with epinephrine was injected into the anterior chamber.  The anterior chamber was filled with viscoelastic.  A 2.4 millimeter keratome was used to make a near-clear corneal incision at the 8:00 position.  A curvilinear capsulorrhexis was made with a cystotome and capsulorrhexis forceps.  Balanced salt solution was used to hydrodissect and hydrodelineate the nucleus.   Phacoemulsification was then used in stop and chop fashion to remove the lens nucleus and epinucleus.  The  remaining cortex was then removed using the irrigation and aspiration handpiece. Viscoelastic was then placed into the capsular bag to distend it for lens placement.  A lens was then injected into the capsular bag.  The remaining viscoelastic was aspirated.   Wounds were hydrated with balanced salt solution.  The anterior chamber was inflated to a physiologic pressure with balanced salt solution.   Intracameral vigamox 0.1 mL undiluted was injected into the eye and a drop placed onto the ocular surface.  No wound leaks were noted.  The patient was taken to the recovery room in stable condition without complications of anesthesia or surgery  Benay Pillow 10/16/2021, 8:57 AM

## 2021-10-16 NOTE — Transfer of Care (Signed)
Immediate Anesthesia Transfer of Care Note  Patient: Charles Webster  Procedure(s) Performed: CATARACT EXTRACTION PHACO AND INTRAOCULAR LENS PLACEMENT (IOC) RIGHT (Right: Eye)  Patient Location: PACU  Anesthesia Type: No value filed.  Level of Consciousness: awake, alert  and patient cooperative  Airway and Oxygen Therapy: Patient Spontanous Breathing and Patient connected to supplemental oxygen  Post-op Assessment: Post-op Vital signs reviewed, Patient's Cardiovascular Status Stable, Respiratory Function Stable, Patent Airway and No signs of Nausea or vomiting  Post-op Vital Signs: Reviewed and stable  Complications: No notable events documented.

## 2021-10-16 NOTE — Anesthesia Postprocedure Evaluation (Signed)
Anesthesia Post Note  Patient: Charles Webster  Procedure(s) Performed: CATARACT EXTRACTION PHACO AND INTRAOCULAR LENS PLACEMENT ([place]) RIGHT (Right: Eye)     Patient location during evaluation: PACU Anesthesia Type: MAC Level of consciousness: awake and alert Pain management: pain level controlled Vital Signs Assessment: post-procedure vital signs reviewed and stable Respiratory status: spontaneous breathing, nonlabored ventilation, respiratory function stable and patient connected to nasal cannula oxygen Cardiovascular status: stable and blood pressure returned to baseline Postop Assessment: no apparent nausea or vomiting Anesthetic complications: no   No notable events documented.  April Manson

## 2021-10-17 ENCOUNTER — Other Ambulatory Visit: Payer: Self-pay

## 2021-10-17 ENCOUNTER — Encounter: Payer: Self-pay | Admitting: Ophthalmology

## 2021-10-24 DIAGNOSIS — H2512 Age-related nuclear cataract, left eye: Secondary | ICD-10-CM | POA: Diagnosis not present

## 2021-10-25 NOTE — Discharge Instructions (Signed)

## 2021-10-30 ENCOUNTER — Ambulatory Visit
Admission: RE | Admit: 2021-10-30 | Discharge: 2021-10-30 | Disposition: A | Discharge status: Home / Self Care | Payer: Medicare Other | Source: Ambulatory Visit | Attending: Ophthalmology | Admitting: Ophthalmology

## 2021-10-30 ENCOUNTER — Other Ambulatory Visit: Payer: Self-pay

## 2021-10-30 ENCOUNTER — Ambulatory Visit: Payer: Medicare Other | Admitting: Anesthesiology

## 2021-10-30 ENCOUNTER — Encounter
Admission: RE | Disposition: A | Discharge status: Home / Self Care | Payer: Self-pay | Source: Ambulatory Visit | Attending: Ophthalmology

## 2021-10-30 ENCOUNTER — Encounter: Payer: Self-pay | Admitting: Ophthalmology

## 2021-10-30 DIAGNOSIS — J449 Chronic obstructive pulmonary disease, unspecified: Secondary | ICD-10-CM | POA: Diagnosis not present

## 2021-10-30 DIAGNOSIS — J441 Chronic obstructive pulmonary disease with (acute) exacerbation: Secondary | ICD-10-CM | POA: Diagnosis not present

## 2021-10-30 DIAGNOSIS — K219 Gastro-esophageal reflux disease without esophagitis: Secondary | ICD-10-CM | POA: Diagnosis not present

## 2021-10-30 DIAGNOSIS — I509 Heart failure, unspecified: Secondary | ICD-10-CM | POA: Insufficient documentation

## 2021-10-30 DIAGNOSIS — Z9981 Dependence on supplemental oxygen: Secondary | ICD-10-CM | POA: Insufficient documentation

## 2021-10-30 DIAGNOSIS — K449 Diaphragmatic hernia without obstruction or gangrene: Secondary | ICD-10-CM | POA: Diagnosis not present

## 2021-10-30 DIAGNOSIS — Z87891 Personal history of nicotine dependence: Secondary | ICD-10-CM | POA: Insufficient documentation

## 2021-10-30 DIAGNOSIS — H269 Unspecified cataract: Secondary | ICD-10-CM | POA: Diagnosis not present

## 2021-10-30 DIAGNOSIS — H2512 Age-related nuclear cataract, left eye: Secondary | ICD-10-CM | POA: Diagnosis not present

## 2021-10-30 HISTORY — PX: CATARACT EXTRACTION W/PHACO: SHX586

## 2021-10-30 SURGERY — PHACOEMULSIFICATION, CATARACT, WITH IOL INSERTION
Anesthesia: Monitor Anesthesia Care | Site: Eye | Laterality: Left

## 2021-10-30 MED ORDER — LIDOCAINE HCL (PF) 2 % IJ SOLN
INTRAOCULAR | Status: DC | PRN
  Administered 2021-10-30: 4 mL via INTRAOCULAR

## 2021-10-30 MED ORDER — ARMC OPHTHALMIC DILATING DROPS
1.0000 | OPHTHALMIC | Status: DC | PRN
  Administered 2021-10-30 (×3): 1 via OPHTHALMIC

## 2021-10-30 MED ORDER — MOXIFLOXACIN HCL 0.5 % OP SOLN
OPHTHALMIC | Status: DC | PRN
  Administered 2021-10-30: 0.2 mL via OPHTHALMIC

## 2021-10-30 MED ORDER — TETRACAINE HCL 0.5 % OP SOLN
1.0000 [drp] | OPHTHALMIC | Status: DC | PRN
  Administered 2021-10-30 (×3): 1 [drp] via OPHTHALMIC

## 2021-10-30 MED ORDER — SIGHTPATH DOSE#1 BSS IO SOLN
INTRAOCULAR | Status: DC | PRN
  Administered 2021-10-30: 15 mL via INTRAOCULAR

## 2021-10-30 MED ORDER — FENTANYL CITRATE (PF) 100 MCG/2ML IJ SOLN
INTRAMUSCULAR | Status: DC | PRN
  Administered 2021-10-30: 50 ug via INTRAVENOUS

## 2021-10-30 MED ORDER — SIGHTPATH DOSE#1 NA HYALUR & NA CHOND-NA HYALUR IO KIT
PACK | INTRAOCULAR | Status: DC | PRN
  Administered 2021-10-30: 1 via OPHTHALMIC

## 2021-10-30 MED ORDER — SIGHTPATH DOSE#1 BSS IO SOLN
INTRAOCULAR | Status: DC | PRN
  Administered 2021-10-30: 80 mL via OPHTHALMIC

## 2021-10-30 MED ORDER — MIDAZOLAM HCL 2 MG/2ML IJ SOLN
INTRAMUSCULAR | Status: DC | PRN
  Administered 2021-10-30: 1 mg via INTRAVENOUS

## 2021-10-30 SURGICAL SUPPLY — 15 items
CANNULA ANT/CHMB 27G (MISCELLANEOUS) IMPLANT
CANNULA ANT/CHMB 27GA (MISCELLANEOUS) IMPLANT
CATARACT SUITE SIGHTPATH (MISCELLANEOUS) ×1 IMPLANT
DISSECTOR HYDRO NUCLEUS 50X22 (MISCELLANEOUS) ×1 IMPLANT
FEE CATARACT SUITE SIGHTPATH (MISCELLANEOUS) ×1 IMPLANT
GLOVE SURG GAMMEX PI TX LF 7.5 (GLOVE) ×1 IMPLANT
GLOVE SURG SYN 8.5  E (GLOVE) ×1
GLOVE SURG SYN 8.5 E (GLOVE) ×1 IMPLANT
GLOVE SURG SYN 8.5 PF PI (GLOVE) ×1 IMPLANT
LENS IOL TECNIS EYHANCE 20.0 (Intraocular Lens) IMPLANT
NDL FILTER BLUNT 18X1 1/2 (NEEDLE) ×1 IMPLANT
NEEDLE FILTER BLUNT 18X1 1/2 (NEEDLE) ×1 IMPLANT
SYR 3ML LL SCALE MARK (SYRINGE) ×1 IMPLANT
SYR 5ML LL (SYRINGE) ×1 IMPLANT
WATER STERILE IRR 250ML POUR (IV SOLUTION) ×1 IMPLANT

## 2021-10-30 NOTE — Anesthesia Postprocedure Evaluation (Signed)
Anesthesia Post Note  Patient: Charles Webster  Procedure(s) Performed: CATARACT EXTRACTION PHACO AND INTRAOCULAR LENS PLACEMENT (IOC) LEFT 10.02 00:50.0 (Left: Eye)  Patient location: Phase II. Anesthesia Type: MAC Level of consciousness: awake and alert Pain management: pain level controlled Vital Signs Assessment: post-procedure vital signs reviewed and stable Respiratory status: spontaneous breathing, nonlabored ventilation, respiratory function stable and patient connected to nasal cannula oxygen Cardiovascular status: stable and blood pressure returned to baseline Postop Assessment: no apparent nausea or vomiting Anesthetic complications: no   There were no known notable events for this encounter.   Last Vitals:  Vitals:   10/30/21 0929 10/30/21 0934  BP: 110/79 120/68  Pulse: 85 83  Resp: (!) 22 19  Temp: (!) 36.4 C   SpO2: 99% 95%    Last Pain:  Vitals:   10/30/21 0934  TempSrc:   PainSc: 0-No pain                 Precious Haws Thailan Sava

## 2021-10-30 NOTE — Anesthesia Preprocedure Evaluation (Signed)
Anesthesia Evaluation  Patient identified by MRN, date of birth, ID band Patient awake    Reviewed: Allergy & Precautions, NPO status , Patient's Chart, lab work & pertinent test results  History of Anesthesia Complications Negative for: history of anesthetic complications  Airway Mallampati: III  TM Distance: <3 FB Neck ROM: full    Dental  (+) Chipped, Poor Dentition, Missing   Pulmonary shortness of breath, COPD,  COPD inhaler and oxygen dependent, former smoker,    Pulmonary exam normal        Cardiovascular (-) angina+CHF  Normal cardiovascular exam     Neuro/Psych  Neuromuscular disease negative psych ROS   GI/Hepatic Neg liver ROS, hiatal hernia, GERD  Controlled,  Endo/Other  negative endocrine ROSType 2  Renal/GU      Musculoskeletal   Abdominal   Peds  Hematology negative hematology ROS (+)   Anesthesia Other Findings Past Medical History: No date: Bronchitis No date: CHF (congestive heart failure) (HCC) No date: COPD (chronic obstructive pulmonary disease) (HCC) No date: Diverticulitis No date: GERD (gastroesophageal reflux disease) No date: Hiatal hernia No date: Oxygen dependent     Comment:  5L [place] continuous No date: Wears dentures     Comment:  full upper, partial lower.  do not fit well  Past Surgical History: 10/16/2021: CATARACT EXTRACTION W/PHACO; Right     Comment:  Procedure: CATARACT EXTRACTION PHACO AND INTRAOCULAR               LENS PLACEMENT (IOC) RIGHT;  Surgeon: Eulogio Bear,              MD;  Location: [facility];  Service:               Ophthalmology;  Laterality: Right;  13.19 1:04.3 2005: LUNG REMOVAL, PARTIAL     Comment:  removed 20% of right lung     Reproductive/Obstetrics negative OB ROS                             Anesthesia Physical Anesthesia Plan  ASA: 4  Anesthesia Plan: MAC   Post-op Pain Management:     Induction: Intravenous  PONV Risk Score and Plan:   Airway Management Planned: Natural Airway and Nasal Cannula  Additional Equipment:   Intra-op Plan:   Post-operative Plan:   Informed Consent: I have reviewed the patients History and Physical, chart, labs and discussed the procedure including the risks, benefits and alternatives for the proposed anesthesia with the patient or authorized representative who has indicated his/her understanding and acceptance.     Dental Advisory Given  Plan Discussed with: Anesthesiologist, CRNA and Surgeon  Anesthesia Plan Comments: (Patient consented for risks of anesthesia including but not limited to:  - adverse reactions to medications - damage to eyes, teeth, lips or other oral mucosa - nerve damage due to positioning  - sore throat or hoarseness - Damage to heart, brain, nerves, lungs, other parts of body or loss of life  Patient voiced understanding.)        Anesthesia Quick Evaluation

## 2021-10-30 NOTE — Transfer of Care (Signed)
Immediate Anesthesia Transfer of Care Note  Patient: Charles Webster  Procedure(s) Performed: CATARACT EXTRACTION PHACO AND INTRAOCULAR LENS PLACEMENT (IOC) LEFT 10.02 00:50.0 (Left: Eye)  Patient Location: PACU  Anesthesia Type: MAC  Level of Consciousness: awake, alert  and patient cooperative  Airway and Oxygen Therapy: Patient Spontanous Breathing and Patient connected to supplemental oxygen  Post-op Assessment: Post-op Vital signs reviewed, Patient's Cardiovascular Status Stable, Respiratory Function Stable, Patent Airway and No signs of Nausea or vomiting  Post-op Vital Signs: Reviewed and stable  Complications: There were no known notable events for this encounter.

## 2021-10-30 NOTE — H&P (Signed)
[facility]   Primary Care Physician:  Marina Goodell, MD Ophthalmologist: Dr. Willey Blade  Pre-Procedure History & Physical: HPI:  Charles Webster is a 76 y.o. male here for cataract surgery.   Past Medical History:  Diagnosis Date   Bronchitis    CHF (congestive heart failure) (HCC)    COPD (chronic obstructive pulmonary disease) (HCC)    Diverticulitis    GERD (gastroesophageal reflux disease)    Hiatal hernia    Oxygen dependent    5L [place] continuous   Wears dentures    full upper, partial lower.  do not fit well    Past Surgical History:  Procedure Laterality Date   CATARACT EXTRACTION W/PHACO Right 10/16/2021   Procedure: CATARACT EXTRACTION PHACO AND INTRAOCULAR LENS PLACEMENT (IOC) RIGHT;  Surgeon: Nevada Crane, MD;  Location: [facility];  Service: Ophthalmology;  Laterality: Right;  13.19 1:04.3   LUNG REMOVAL, PARTIAL  2005   removed 20% of right lung    Prior to Admission medications   Medication Sig Start Date End Date Taking? Authorizing Provider  albuterol (VENTOLIN HFA) 108 (90 Base) MCG/ACT inhaler Inhale 2 puffs into the lungs every 4 (four) hours as needed for wheezing or shortness of breath. 09/23/20  Yes Esaw Grandchild A, DO  budesonide (PULMICORT) 0.5 MG/2ML nebulizer solution INHALE 1 VIAL TWICE A DAY 02/27/21  Yes Kasa, Wallis Bamberg, MD  cetirizine (ZYRTEC) 10 MG tablet Take 1 tablet by mouth daily as needed.   Yes [provider]  furosemide (LASIX) 40 MG tablet Take 1 tablet (40 mg total) by mouth 2 (two) times daily. 11/25/20  Yes Lurene Shadow, MD  ipratropium-albuterol (DUONEB) 0.5-2.5 (3) MG/3ML SOLN Take 3 mLs by nebulization every 6 (six) hours as needed. 10/03/21  Yes Kasa, Wallis Bamberg, MD  nebivolol (BYSTOLIC) 5 MG tablet Take 0.5 tablets (2.5 mg total) by mouth daily. 11/25/20  Yes Lurene Shadow, MD  omeprazole (PRILOSEC) 40 MG capsule Take 1 capsule by mouth 2 (two) times daily. 06/07/20  Yes [provider]   OXYGEN Inhale into the lungs. 5 liters   Yes [provider]  potassium chloride (KLOR-CON) 10 MEQ tablet Take 1 tablet (10 mEq total) by mouth daily. 11/25/20  Yes Lurene Shadow, MD  predniSONE (DELTASONE) 20 MG tablet TAKE 1 TABLET BY MOUTH EVERY DAY WITH BREAKFAST 08/23/21  Yes Kasa, Wallis Bamberg, MD  roflumilast (DALIRESP) 500 MCG TABS tablet Take 1 tablet (500 mcg total) by mouth daily. 10/06/21  Yes Erin Fulling, MD  feeding supplement (ENSURE ENLIVE / ENSURE PLUS) LIQD Take 237 mLs by mouth 2 (two) times daily between meals. Patient not taking: Reported on 10/10/2021 09/23/20   Esaw Grandchild A, DO  lidocaine (LIDODERM) 5 % Apply 1 patch to area of pain daily.  Remove & Discard patch within 12 hours or as directed Patient not taking: Reported on 10/10/2021 05/04/21   Shirlee Latch, PA-C  oxyCODONE-acetaminophen (PERCOCET) 5-325 MG tablet Take 1-2 tablets by mouth every 4 (four) hours as needed for severe pain. Patient not taking: Reported on 10/10/2021 05/23/21 05/23/22  Faythe Ghee, PA-C    Allergies as of 09/18/2021 - Review Complete 05/23/2021  Allergen Reaction Noted   Alpha blocker quinazolines  08/10/2014   Clindamycin/lincomycin Nausea Only 08/10/2014   Doxycycline Other (See Comments) 07/14/2013   Levaquin [levofloxacin]  08/10/2014   Penicillins  08/10/2014   Singulair [montelukast sodium] Other (See Comments) 12/13/2017   Sulfa antibiotics Other (See Comments) 07/14/2013   Tamsulosin  Other (See Comments) 07/14/2013    Family History  Problem Relation Age of Onset   Heart disease Mother    Kidney failure Father     Social History   Socioeconomic History   Marital status: Divorced    Spouse name: Not on file   Number of children: Not on file   Years of education: Not on file   Highest education level: Not on file  Occupational History   Not on file  Tobacco Use   Smoking status: Former    Packs/day: 1.00    Years: 40.00    Total pack years: 40.00     Types: Cigarettes    Quit date: 10/04/2003    Years since quitting: 18.0   Smokeless tobacco: Never  Vaping Use   Vaping Use: Never used  Substance and Sexual Activity   Alcohol use: No   Drug use: No   Sexual activity: Not on file  Other Topics Concern   Not on file  Social History Narrative   Not on file   Social Determinants of Health   Financial Resource Strain: Not on file  Food Insecurity: Not on file  Transportation Needs: Not on file  Physical Activity: Not on file  Stress: Not on file  Social Connections: Not on file  Intimate Partner Violence: Not on file    Review of Systems: See HPI, otherwise negative ROS  Physical Exam: BP 120/78   Temp 97.7 F (36.5 C) (Tympanic)   Resp (!) 21   Ht 5' 5.98" (1.676 m)   Wt 58.8 kg   SpO2 100%   BMI 20.93 kg/m  General:   Alert, cooperative in NAD Head:  Normocephalic and atraumatic. Respiratory:   02 required, increased work of breathing. Cardiovascular:  RRR  Impression/Plan: Charles Webster is here for cataract surgery.  Risks, benefits, limitations, and alternatives regarding cataract surgery have been reviewed with the patient.  Questions have been answered.  All parties agreeable.   Benay Pillow, MD  10/30/2021, 8:57 AM

## 2021-10-30 NOTE — Op Note (Signed)
OPERATIVE NOTE  Charles Webster [id number] 10/30/2021   PREOPERATIVE DIAGNOSIS:  Nuclear sclerotic cataract left eye.  H25.12   POSTOPERATIVE DIAGNOSIS:    Nuclear sclerotic cataract left eye.     PROCEDURE:  Phacoemusification with posterior chamber intraocular lens placement of the left eye   LENS:   Implant Name Type Inv. Item Serial No. Manufacturer Lot No. LRB No. Used Action  LENS IOL TECNIS EYHANCE 20.0 - [id number] Intraocular Lens LENS IOL TECNIS EYHANCE 20.0 [id number] [employer]  Left 1 Implanted      Procedure(s): CATARACT EXTRACTION PHACO AND INTRAOCULAR LENS PLACEMENT (IOC) LEFT 10.02 00:50.0 (Left)  DIB00 +20.0   ULTRASOUND TIME: 0 minutes 50 seconds.  CDE 10.02   SURGEON:  Benay Pillow, MD, MPH   ANESTHESIA:  Topical with tetracaine drops augmented with 1% preservative-free intracameral lidocaine.  ESTIMATED BLOOD LOSS: <1 mL   COMPLICATIONS:  None.   DESCRIPTION OF PROCEDURE:  The patient was identified in the holding room and transported to the operating room and placed in the supine position under the operating microscope.  The left eye was identified as the operative eye and it was prepped and draped in the usual sterile ophthalmic fashion.   A 1.0 millimeter clear-corneal paracentesis was made at the 5:00 position. 0.5 ml of preservative-free 1% lidocaine with epinephrine was injected into the anterior chamber.  The anterior chamber was filled with viscoelastic.  A 2.4 millimeter keratome was used to make a near-clear corneal incision at the 2:00 position.  A curvilinear capsulorrhexis was made with a cystotome and capsulorrhexis forceps.  Balanced salt solution was used to hydrodissect and hydrodelineate the nucleus.   Phacoemulsification was then used in stop and chop fashion to remove the lens nucleus and epinucleus.  The remaining cortex was then removed using the irrigation and aspiration handpiece. Viscoelastic was then placed into the capsular bag to  distend it for lens placement.  A lens was then injected into the capsular bag.  The remaining viscoelastic was aspirated.   Wounds were hydrated with balanced salt solution.  The anterior chamber was inflated to a physiologic pressure with balanced salt solution.  Intracameral vigamox 0.1 mL undiltued was injected into the eye and a drop placed onto the ocular surface.  No wound leaks were noted.  The patient was taken to the recovery room in stable condition without complications of anesthesia or surgery  Benay Pillow 10/30/2021, 9:26 AM

## 2021-10-31 ENCOUNTER — Encounter: Payer: Self-pay | Admitting: Ophthalmology

## 2021-11-22 ENCOUNTER — Other Ambulatory Visit: Payer: Self-pay | Admitting: Internal Medicine

## 2021-12-05 DIAGNOSIS — I5032 Chronic diastolic (congestive) heart failure: Secondary | ICD-10-CM | POA: Diagnosis not present

## 2021-12-05 DIAGNOSIS — I2510 Atherosclerotic heart disease of native coronary artery without angina pectoris: Secondary | ICD-10-CM | POA: Diagnosis not present

## 2021-12-05 DIAGNOSIS — I10 Essential (primary) hypertension: Secondary | ICD-10-CM | POA: Diagnosis not present

## 2021-12-19 ENCOUNTER — Other Ambulatory Visit: Payer: Self-pay

## 2021-12-19 ENCOUNTER — Other Ambulatory Visit: Payer: Self-pay | Admitting: Internal Medicine

## 2022-01-20 ENCOUNTER — Other Ambulatory Visit: Payer: Self-pay | Admitting: Internal Medicine

## 2022-02-18 ENCOUNTER — Other Ambulatory Visit: Payer: Self-pay | Admitting: Internal Medicine

## 2022-02-18 DIAGNOSIS — J9611 Chronic respiratory failure with hypoxia: Secondary | ICD-10-CM

## 2022-02-18 DIAGNOSIS — J441 Chronic obstructive pulmonary disease with (acute) exacerbation: Secondary | ICD-10-CM

## 2022-02-25 ENCOUNTER — Other Ambulatory Visit: Payer: Self-pay | Admitting: Internal Medicine

## 2022-03-27 ENCOUNTER — Other Ambulatory Visit: Payer: Self-pay | Admitting: Internal Medicine

## 2022-04-17 ENCOUNTER — Encounter: Payer: Self-pay | Admitting: Internal Medicine

## 2022-04-17 ENCOUNTER — Ambulatory Visit (INDEPENDENT_AMBULATORY_CARE_PROVIDER_SITE_OTHER): Payer: Medicare Other | Admitting: Internal Medicine

## 2022-04-17 VITALS — BP 120/64 | HR 94 | Temp 97.6°F | Ht 66.0 in | Wt 139.6 lb

## 2022-04-17 DIAGNOSIS — R0689 Other abnormalities of breathing: Secondary | ICD-10-CM

## 2022-04-17 DIAGNOSIS — J9611 Chronic respiratory failure with hypoxia: Secondary | ICD-10-CM

## 2022-04-17 DIAGNOSIS — J449 Chronic obstructive pulmonary disease, unspecified: Secondary | ICD-10-CM

## 2022-04-17 MED ORDER — ROFLUMILAST 500 MCG PO TABS: 500.0000 ug | ORAL_TABLET | Freq: Every day | ORAL | 10 refills | Status: DC

## 2022-04-17 NOTE — Progress Notes (Signed)
Name: Charles Webster MRN: [id number] DOB: [birth year]     CONSULTATION DATE: 5.9.19 REFERRING MD : Tor Netters  STUDIES:   4.7.18   CXR independently reviewed by Me  Increased lung volumes No pneumonia No effusions  Previous HISTORY OF PRESENT ILLNESS: 77 year old pleasant white male seen today for assessment for COPD Patient states he has been diagnosed with COPD for approximately 2 years ago however based on further questioning patient has chronic shortness of breath and progressive dyspnea exertion for the past 5 years  Patient has a history of tobacco abuse 1 pack a day for 40 years Patient worked at the [employer] and was exposed to paints 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  Patient states 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 at [facility]  09/2020 COVID infection and ICU admission for COPD and CHF exacerbation  CC Follow-up severe COPD Follow-up chronic hypoxic respiratory failure   HPI Severe COPD follow-up assessment Chronic shortness of breath chronic dyspnea on exertion Previous admission for COVID infection heart failure exacerbation several years ago Does really well with pulmonary rehab 5 L of oxygen now Multiple bouts of COPD exacerbation in the past Patient on long-term prednisone therapy 20 mg daily Patient refuses to come off of prednisone at this time but he says he will try to taper as tolerated  Patient did not tolerate traditional inhaler therapy therefore was switched to nebulized therapy Pulmicort twice daily DuoNebs every 4 This regimen seems to help him   No exacerbation at this time No evidence of heart failure at this time No evidence or signs of infection at this time No respiratory distress No fevers, chills, nausea, vomiting, diarrhea No evidence of lower extremity edema No evidence hemoptysis   Stopped taking traditional inhaler therapy due to poor  respiratory insufficiency  Patient uses and benefits from oxygen therapy He needs this for survival   Patient has failed all types of inhaler therapy as well as albuterol neb therapy  Outpatient Encounter Medications as of 04/17/2022  Medication Sig   albuterol (VENTOLIN HFA) 108 (90 Base) MCG/ACT inhaler Inhale 2 puffs into the lungs every 4 (four) hours as needed for wheezing or shortness of breath.   budesonide (PULMICORT) 0.5 MG/2ML nebulizer solution INHALE 1 VIAL TWICE A DAY   cetirizine (ZYRTEC) 10 MG tablet Take 1 tablet by mouth daily as needed.   feeding supplement (ENSURE ENLIVE / ENSURE PLUS) LIQD Take 237 mLs by mouth 2 (two) times daily between meals. (Patient not taking: Reported on 10/10/2021)   furosemide (LASIX) 40 MG tablet Take 1 tablet (40 mg total) by mouth 2 (two) times daily.   ipratropium-albuterol (DUONEB) 0.5-2.5 (3) MG/3ML SOLN Take 3 mLs by nebulization every 6 (six) hours as needed.   lidocaine (LIDODERM) 5 % Apply 1 patch to area of pain daily.  Remove & Discard patch within 12 hours or as directed (Patient not taking: Reported on 10/10/2021)   nebivolol (BYSTOLIC) 5 MG tablet Take 0.5 tablets (2.5 mg total) by mouth daily.   omeprazole (PRILOSEC) 40 MG capsule Take 1 capsule by mouth 2 (two) times daily.   oxyCODONE-acetaminophen (PERCOCET) 5-325 MG tablet Take 1-2 tablets by mouth every 4 (four) hours as needed for severe pain. (Patient not taking: Reported on 10/10/2021)   OXYGEN Inhale into the lungs. 5 liters   potassium chloride (KLOR-CON) 10 MEQ tablet Take 1 tablet (10 mEq total) by mouth daily.  predniSONE (DELTASONE) 20 MG tablet TAKE 1 TABLET BY MOUTH EVERY DAY WITH BREAKFAST   roflumilast (DALIRESP) 500 MCG TABS tablet Take 1 tablet (500 mcg total) by mouth daily.   No facility-administered encounter medications on file as of 04/17/2022.     BP 120/64 (BP Location: Left Arm, Cuff Size: Normal)   Pulse 94   Temp 97.6 F (36.4 C) (Temporal)   Ht 5'  6" (1.676 m)   Wt 139 lb 9.6 oz (63.3 kg)   SpO2 91%   BMI 22.53 kg/m     Review of Systems: Gen: Fatigue increased work of breathing HEENT: Denies blurred vision, double vision, ear pain, eye pain, hearing loss, nose bleeds, sore throat Cardiac:  No dizziness, chest pain or heaviness, chest tightness,edema, No JVD Resp:   No cough, -sputum production, +shortness of breath,+wheezing, -hemoptysis,  Other:  All other systems negative   Physical Examination:   General Appearance: Minimal distress shortness of breath EYES PERRLA, EOM intact.   NECK Supple, No JVD Pulmonary: normal breath sounds, No wheezing.  CardiovascularNormal S1,S2.  No m/r/g.   Abdomen: Benign, Soft, non-tender. Neurology UE/LE 5/5 strength, no focal deficits Ext pulses intact, cap refill intact ALL OTHER ROS ARE NEGATIVE    ASSESSMENT / PLAN:  77 year old pleasant white male seen today for end-stage COPD Gold stage D with recurrent bouts of COPD exacerbation associated with chronic hypoxic respiratory failure and severe respiratory insufficiency    End-stage COPD Gold stage D  Continue nebulized therapy as maintenance therapy  Patient would like to continue longstanding prednisone therapy   adding Daliresp(PD4 inh) to regimen and assess resp status Avoid secondhand smoke Avoid SICK contacts Recommend  Masking  when appropriate Recommend Keep up-to-date with vaccinations   Chronic hypoxic respiratory failure from COPD Patient uses and benefits from oxygen therapy He needs this for survival   Patient on long-term prednisone therapy 20 mg daily Patient refuses to come off of prednisone at this time but he says he will try to taper as tolerated    MEDICATION ADJUSTMENTS/LABS AND TESTS ORDERED: Continue oxygen as prescribed Continue prednisone as prescribed Continue nebulizers as prescribed Restart Daliresp therapy ,Avoid secondhand smoke Avoid SICK contacts Recommend  Masking  when  appropriate Recommend Keep up-to-date with vaccinations   CURRENT MEDICATIONS REVIEWED AT [place]   Patient satisfied with Plan of action and management. All questions answered   Follow-up in 6 months  Total time spent 32 minutes   Corrin Parker, M.D.  [facility]  Medical Director [place] Director [facility] Cardio-Pulmonary Department

## 2022-04-17 NOTE — Patient Instructions (Addendum)
Continue oxygen as prescribed Continue prednisone as prescribed Continue nebulizers as prescribed Restart Daliresp therapy  Avoid secondhand smoke Avoid SICK contacts Recommend  Masking  when appropriate Recommend Keep up-to-date with vaccinations

## 2022-04-23 ENCOUNTER — Other Ambulatory Visit: Payer: Self-pay | Admitting: Internal Medicine

## 2022-05-24 ENCOUNTER — Other Ambulatory Visit: Payer: Self-pay | Admitting: Internal Medicine

## 2022-06-22 ENCOUNTER — Other Ambulatory Visit: Payer: Self-pay | Admitting: Internal Medicine

## 2022-07-11 ENCOUNTER — Other Ambulatory Visit: Payer: Self-pay | Admitting: Internal Medicine

## 2022-07-24 ENCOUNTER — Other Ambulatory Visit: Payer: Self-pay | Admitting: Internal Medicine

## 2022-09-09 ENCOUNTER — Ambulatory Visit
Admission: RE | Admit: 2022-09-09 | Discharge: 2022-09-09 | Disposition: A | Discharge status: Home / Self Care | Payer: Medicare Other | Source: Ambulatory Visit | Attending: Emergency Medicine | Admitting: Emergency Medicine

## 2022-09-09 VITALS — BP 125/79 | HR 78 | Temp 98.5°F | Resp 16 | Ht 66.0 in | Wt 135.0 lb

## 2022-09-09 DIAGNOSIS — S60511A Abrasion of right hand, initial encounter: Secondary | ICD-10-CM

## 2022-09-09 MED ORDER — MUPIROCIN CALCIUM 2 % EX CREA: 1.0000 | TOPICAL_CREAM | Freq: Two times a day (BID) | CUTANEOUS | 0 refills | Status: AC

## 2022-09-09 NOTE — ED Triage Notes (Signed)
Pt c/o R hand edema x6 days. States unknown cause, just started to swell. Denies any pain. Pt currently on 5L of O2.

## 2022-09-09 NOTE — ED Provider Notes (Signed)
[facility]    CSN: [id number] Arrival date & time: 09/09/22  1312      History   Chief Complaint Chief Complaint  Patient presents with   Hand Problem    Appt    HPI Charles Webster is a 77 y.o. male.   77 year old male patient, Charles Webster, presents to urgent care for evaluation of right hand swelling.  Patient states he has multiple abrasions and cuts on his hands, he is concerned the right one is infected.  No treatment prior to arrival.  Patient denies any fall or traumatic injury.  Patient states he is paperthin and if he just touches his skin or puts a Band-Aid on it will peel the top layer of skin off.  The history is provided by the patient. No language interpreter was used.    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  do not fit well    Patient Active Problem List   Diagnosis Date Noted   Abrasion of right hand 09/09/2022   Acute on chronic diastolic CHF (congestive heart failure) (HCC) 11/25/2020   Acute on chronic respiratory failure with hypoxia and hypercapnia (HCC) 11/21/2020   Anemia 11/21/2020   GERD (gastroesophageal reflux disease) 11/21/2020   Palliative care by specialist    Shortness of breath 09/10/2020   COVID-19 virus infection 09/10/2020   COPD exacerbation (HCC) 05/05/2016   Allergic rhinitis 07/14/2013   Duodenitis 12/04/2012   Hiatal hernia 12/04/2012    Past Surgical History:  Procedure Laterality Date   CATARACT EXTRACTION W/PHACO Right 10/16/2021   Procedure: CATARACT EXTRACTION PHACO AND INTRAOCULAR LENS PLACEMENT (IOC) RIGHT;  Surgeon: Nevada Crane, MD;  Location: [facility];  Service: Ophthalmology;  Laterality: Right;  13.19 1:04.3    CATARACT EXTRACTION W/PHACO Left 10/30/2021   Procedure: CATARACT EXTRACTION PHACO AND INTRAOCULAR LENS PLACEMENT (IOC) LEFT 10.02 00:50.0;  Surgeon: Nevada Crane, MD;  Location: [facility];  Service: Ophthalmology;  Laterality: Left;   LUNG REMOVAL, PARTIAL  2005   removed 20% of right lung       Home Medications    Prior to Admission medications   Medication Sig Start Date End Date Taking? Authorizing Provider  albuterol (VENTOLIN HFA) 108 (90 Base) MCG/ACT inhaler Inhale 2 puffs into the lungs every 4 (four) hours as needed for wheezing or shortness of breath. 09/23/20  Yes Esaw Grandchild A, DO  budesonide (PULMICORT) 0.5 MG/2ML nebulizer solution INHALE 1 VIAL TWICE A DAY 02/18/22  Yes Kasa, Wallis Bamberg, MD  cetirizine (ZYRTEC) 10 MG tablet Take 1 tablet by mouth daily as needed.   Yes [provider]  feeding supplement (ENSURE ENLIVE / ENSURE PLUS) LIQD Take 237 mLs by mouth 2 (two) times daily between meals. 09/23/20  Yes Esaw Grandchild A, DO  furosemide (LASIX) 40 MG tablet Take 1 tablet (40 mg total) by mouth 2 (two) times daily. 11/25/20  Yes Lurene Shadow, MD  ipratropium-albuterol (DUONEB) 0.5-2.5 (3) MG/3ML SOLN Take 3 mLs by nebulization every 6 (six) hours as needed. 10/03/21  Yes Kasa, Wallis Bamberg, MD  lidocaine (LIDODERM) 5 % Apply 1 patch to area of pain daily.  Remove &  Discard patch within 12 hours or as directed 05/04/21  Yes Shirlee Latch, PA-C  mupirocin cream (BACTROBAN) 2 % Apply 1 Application topically 2 (two) times daily for 7 days. 09/09/22 09/16/22 Yes , Para March, NP  nebivolol (BYSTOLIC) 5 MG tablet Take 0.5 tablets (2.5 mg total) by mouth daily. 11/25/20  Yes Lurene Shadow, MD  omeprazole (PRILOSEC) 40 MG capsule Take 1 capsule by mouth 2 (two) times daily. 06/07/20  Yes [provider]  OXYGEN Inhale into the lungs. 5 liters   Yes [provider]  potassium chloride (KLOR-CON) 10 MEQ tablet Take 1 tablet (10 mEq total) by  mouth daily. 11/25/20  Yes Lurene Shadow, MD  predniSONE (DELTASONE) 20 MG tablet TAKE 1 TABLET BY MOUTH EVERY DAY WITH BREAKFAST 07/24/22  Yes Kasa, Wallis Bamberg, MD  roflumilast (DALIRESP) 500 MCG TABS tablet Take 1 tablet (500 mcg total) by mouth daily. 04/17/22  Yes Erin Fulling, MD    Family History Family History  Problem Relation Age of Onset   Heart disease Mother    Kidney failure Father     Social History Social History   Tobacco Use   Smoking status: Former    Current packs/day: 0.00    Average packs/day: 1 pack/day for 40.0 years (40.0 ttl pk-yrs)    Types: Cigarettes    Start date: 10/04/1963    Quit date: 10/04/2003    Years since quitting: 18.9   Smokeless tobacco: Never  Vaping Use   Vaping status: Never Used  Substance Use Topics   Alcohol use: No   Drug use: No     Allergies   Alpha blocker quinazolines, Bromelains, Clindamycin/lincomycin, Doxycycline, Levaquin [levofloxacin], Penicillins, Singulair [montelukast sodium], Sulfa antibiotics, Tamsulosin, and Zinc   Review of Systems Review of Systems  Skin:  Positive for color change and wound.  All other systems reviewed and are negative.    Physical Exam Triage Vital Signs ED Triage Vitals  Encounter Vitals Group     BP --      Systolic BP Percentile --      Diastolic BP Percentile --      Pulse --      Resp 09/09/22 1321 16     Temp --      Temp Source 09/09/22 1321 Oral     SpO2 --      Weight 09/09/22 1320 135 lb (61.2 kg)     Height 09/09/22 1320 5\' 6"  (1.676 m)     Head Circumference --      Peak Flow --      Pain Score 09/09/22 1323 0     Pain Loc --      Pain Education --      Exclude from Growth Chart --    No data found.  Updated Vital Signs BP 125/79 (BP Location: Left Arm)   Pulse 78   Temp 98.5 F (36.9 C) (Oral)   Resp 16   Ht 5\' 6"  (1.676 m)   Wt 135 lb (61.2 kg)   SpO2 97%   BMI 21.79 kg/m   Visual Acuity Right Eye Distance:   Left Eye Distance:   Bilateral  Distance:    Right Eye Near:   Left Eye Near:    Bilateral Near:     Physical Exam Vitals and nursing note reviewed.  Skin:    General: Skin is warm.     Capillary Refill: Capillary refill takes less than 2 seconds.     Findings: Ecchymosis and wound  present.     Comments: Right hand with numerous abrasions noted, specifically area to webbing between third and fourth right fingers with clear drainage noted.  Mild ecchymosis, swelling.  Patient has full range of motion  Neurological:     Cranial Nerves: No cranial nerve deficit.     Sensory: No sensory deficit.  Psychiatric:        Attention and Perception: Attention normal.        Mood and Affect: Mood normal.        Speech: Speech normal.        Behavior: Behavior normal.      UC Treatments / Results  Labs (all labs ordered are listed, but only abnormal results are displayed) Labs Reviewed - No data to display  EKG   Radiology No results found.  Procedures Procedures (including critical care time)  Medications Ordered in UC Medications - No data to display  Initial Impression / Assessment and Plan / UC Course  I have reviewed the triage vital signs and the nursing notes.  Pertinent labs & imaging results that were available during my care of the patient were reviewed by me and considered in my medical decision making (see chart for details).    No imaging indicated as patient denies fall or traumatic injury. Will treat with mupirocin as pt is allergic to all oral classes of antibiotics listed.  Patient verbalized understanding to this provider, strict go to ER precautions given.  Ddx: right hand abrasions, hand contusions, hand pain,cellulitis Final Clinical Impressions(s) / UC Diagnoses   Final diagnoses:  Abrasion of right hand, initial encounter     Discharge Instructions      Use mupirocin as directed. Please follow up with PCP in 3 days for wound check.   GO to Er for new or worsening issues or  concerns(fever, purulent drainage,pain, etc)     ED Prescriptions     Medication Sig Dispense Auth. Provider   mupirocin cream (BACTROBAN) 2 % Apply 1 Application topically 2 (two) times daily for 7 days. 15 g , Para March, NP      PDMP not reviewed this encounter.   Clancy Gourd, NP 09/09/22 1409

## 2022-09-09 NOTE — Discharge Instructions (Signed)
Use mupirocin as directed. Please follow up with PCP in 3 days for wound check.   GO to Er for new or worsening issues or concerns(fever, purulent drainage,pain, etc)

## 2022-11-07 ENCOUNTER — Ambulatory Visit: Payer: Medicare Other | Admitting: Nurse Practitioner

## 2022-11-07 ENCOUNTER — Other Ambulatory Visit
Admission: RE | Admit: 2022-11-07 | Discharge: 2022-11-07 | Disposition: A | Discharge status: Home / Self Care | Payer: Medicare Other | Source: Ambulatory Visit | Attending: Nurse Practitioner | Admitting: Nurse Practitioner

## 2022-11-07 ENCOUNTER — Encounter: Payer: Self-pay | Admitting: Nurse Practitioner

## 2022-11-07 VITALS — BP 122/60 | HR 91 | Temp 97.6°F | Ht 66.0 in | Wt 138.4 lb

## 2022-11-07 DIAGNOSIS — I5033 Acute on chronic diastolic (congestive) heart failure: Secondary | ICD-10-CM | POA: Insufficient documentation

## 2022-11-07 DIAGNOSIS — J301 Allergic rhinitis due to pollen: Secondary | ICD-10-CM | POA: Diagnosis not present

## 2022-11-07 DIAGNOSIS — J449 Chronic obstructive pulmonary disease, unspecified: Secondary | ICD-10-CM

## 2022-11-07 DIAGNOSIS — J9611 Chronic respiratory failure with hypoxia: Secondary | ICD-10-CM | POA: Diagnosis not present

## 2022-11-07 DIAGNOSIS — J310 Chronic rhinitis: Secondary | ICD-10-CM

## 2022-11-07 LAB — BASIC METABOLIC PANEL
Anion gap: 12 (ref 5–15)
BUN: 38 mg/dL — ABNORMAL HIGH (ref 8–23)
CO2: 34 mmol/L — ABNORMAL HIGH (ref 22–32)
Calcium: 9 mg/dL (ref 8.9–10.3)
Chloride: 91 mmol/L — ABNORMAL LOW (ref 98–111)
Creatinine, Ser: 1.56 mg/dL — ABNORMAL HIGH (ref 0.61–1.24)
GFR, Estimated: 45 mL/min — ABNORMAL LOW (ref >60)
Glucose, Bld: 91 mg/dL (ref 70–99)
Potassium: 4.1 mmol/L (ref 3.5–5.1)
Sodium: 137 mmol/L (ref 135–145)

## 2022-11-07 LAB — BRAIN NATRIURETIC PEPTIDE: B Natriuretic Peptide: 136.3 pg/mL — ABNORMAL HIGH (ref 0.0–100.0)

## 2022-11-07 MED ORDER — AZELASTINE-FLUTICASONE 137-50 MCG/ACT NA SUSP: 1.0000 | Freq: Two times a day (BID) | NASAL | 3 refills | Status: AC

## 2022-11-07 MED ORDER — OHTUVAYRE 3 MG/2.5ML IN SUSP
2.5000 mL | Freq: Two times a day (BID) | RESPIRATORY_TRACT | Status: DC
Start: 2022-11-07 — End: 2023-01-04

## 2022-11-07 NOTE — Assessment & Plan Note (Signed)
Add on intranasal steroid/antihistamine. Encouraged to continue daily allergy pill.

## 2022-11-07 NOTE — Assessment & Plan Note (Addendum)
Severe COPD with high symptom burden. No bronchospasm on exam. Symptoms today seem to be more consistent with volume overload. No infectious symptoms. Will obtain imaging if he does not improve. See above plan. He was intolerant of daliresp. Will see if he can tolerate inhaled PDE4 therapy with Ohtuvayre. Medication education/side effect profile reviewed. Rx submitted today. We had a lengthy discussion surrounding his steroid use. He is currently taking high doses daily. Discussed the risks of this. He will begin tapering back to 20 mg daily, which has been his baseline. We will reassess at follow up. Action plan in place. Encouraged to work on graded exercises, as tolerated.

## 2022-11-07 NOTE — Patient Instructions (Addendum)
Continue Albuterol inhaler 2 puffs every 6 hours as needed for shortness of breath or wheezing. Notify if symptoms persist despite rescue inhaler/neb use.  Continue duonebs 3 mL four times a day  Continue budesonide 2 mL Twice daily. Brush tongue and rinse mouth afterwards Continue supplemental oxygen 4-5 lpm for goal >88-90%  -Start Ohtuvayre nebs Twice daily - someone will contact you regarding this  -You can use guaifenesin twice daily for congestion/cough -Azelastine-fluticasone nasal spray 1 spray each nostril Twice daily for nasal congestion  You need to cut down your steroids. Take 1.5 tablets daily for 7 days then take 1 tablet daily and stay on this (20 mg)  Your weight is up 5 lb since you were at your primary care provider's office in August. Since you're also having increased swelling and some increased shortness of breath, I want you to take an extra dose of lasix in the morning for 3 days then go back to 1 tablet twice a day.   Labs today   Follow up in 8 weeks with Dr. Belia Heman or Philis Nettle. If symptoms do not improve or worsen, please contact office for sooner follow up or seek emergency care.

## 2022-11-07 NOTE — Assessment & Plan Note (Addendum)
Slight increase in dyspneic symptoms with associated weight gain and BLE edema, consistent with volume overload. Check BNP and BMET today. Will have him take an additional dose of lasix for the next 3 days then return to 40 mg Twice daily. Advised to monitor weights at home. Notify cardiology if symptoms do not improve. ED precautions reviewed.   Patient Instructions  Continue Albuterol inhaler 2 puffs every 6 hours as needed for shortness of breath or wheezing. Notify if symptoms persist despite rescue inhaler/neb use.  Continue duonebs 3 mL four times a day  Continue budesonide 2 mL Twice daily. Brush tongue and rinse mouth afterwards Continue supplemental oxygen 4-5 lpm for goal >88-90%  -Start Ohtuvayre nebs Twice daily - someone will contact you regarding this  -You can use guaifenesin twice daily for congestion/cough -Azelastine-fluticasone nasal spray 1 spray each nostril Twice daily for nasal congestion  You need to cut down your steroids. Take 1.5 tablets daily for 7 days then take 1 tablet daily and stay on this (20 mg)  Your weight is up 5 lb since you were at your primary care provider's office in August. Since you're also having increased swelling and some increased shortness of breath, I want you to take an extra dose of lasix in the morning for 3 days then go back to 1 tablet twice a day.   Labs today   Follow up in 8 weeks with Dr. Belia Heman or Philis Nettle. If symptoms do not improve or worsen, please contact office for sooner follow up or seek emergency care.

## 2022-11-07 NOTE — Assessment & Plan Note (Signed)
Stable without increased O2 requirement. Goal >88-90% 

## 2022-11-07 NOTE — Progress Notes (Signed)
@Patient  ID: Charles Webster, male    DOB: 1945/12/31, 77 y.o.   MRN: [id number]  Chief Complaint  Patient presents with   Follow-up    Shortness of breath on exertion and rest. Cough with clear phlegm. Occasional wheezing. Was not able to tolerate Roflumilast due to abdominal pain and diarrhea. Reports using Pulmicort daily, and Duoneb daily.     Referring provider: Marina Goodell, MD  HPI: 77 year old male, former smoker followed for COPD, chronic respiratory failure on supplemental O2.  He is a patient of Dr. Clovis Fredrickson and last seen in office 04/17/2022.  Past medical history significant for CHF, allergic rhinitis, hiatal hernia, GERD.  TEST/EVENTS:  12/05/2017 PFT: FVC 66, FEV1 35, ratio 38, TLC 97.  Severe obstructive airway disease with reversibility (13% change) 09/11/2020 echo: EF 55 to 60%.  G1 DD.  RV size and function is normal. 05/23/2021 CT chest without contrast: Acute right clavicle fracture, subacute or chronic fractures to the left ribs.  Multiple thoracic spine compression fractures of varying acuity.  Postsurgical changes in the right hemithorax with volume loss, scarring and chronic pleural thickening.  Unchanged peripherally calcified fluid density at the right lung apex.  Advanced emphysema.  04/17/2022: OV with Dr. Belia Heman.  Severe COPD.  Chronic dyspnea on exertion.  Does really well with pulmonary rehab.  On 5 L of oxygen.  He has had multiple bouts of COPD exacerbation in the past.  He is on long-term prednisone therapy 20 mg daily.  Refuses to come off prednisone at this time but he is willing to try to taper as tolerated.  Did not tolerate traditional inhaler therapy before so he was switched to nebulized therapies with Pulmicort and DuoNebs.  Added on Daliresp.  11/07/2022: Today-follow-up Patient presents today for follow-up.  He has been feeling about the same since he was here last time.  Having a little more trouble with his breathing today.  He actually felt okay  yesterday.  He feels like he is having some more swelling in his lower legs.  Not having much of an increased cough compared to his baseline.  Still has daily congestion and produces clear phlegm from time to time.  He was unable to tolerate the Daliresp due to GI side effects.  He has persistent sinus congestion and drainage, which he thinks contributes to his cough. He denies any fevers, chills, hemoptysis, orthopnea, chest pain.  No sinus tenderness. He is using his budesonide twice a day and DuoNebs 4 times a day.  He is only supposed to be on 20 mg of prednisone daily and was supposed to tapering down; however, he tells me today that he is actually taking 20 mg twice a day for total of 40 mg daily.  Not entirely sure when he started this.  He takes 40 mg of Lasix twice a day.  He is on 5 L of oxygen, which is his baseline.  Allergies  Allergen Reactions   Alpha Blocker Quinazolines Shortness Of Breath   Bromelains Nausea Only   Clindamycin/Lincomycin Nausea Only   Doxycycline Nausea Only   Levaquin [Levofloxacin] Nausea Only   Penicillins    Singulair [Montelukast Sodium] Other (See Comments)    Flu like symptoms   Sulfa Antibiotics Nausea Only   Tamsulosin Nausea Only   Zinc Nausea Only    Immunization History  Administered Date(s) Administered   Fluad Quad(high Dose 65+) 11/23/2020   Influenza, High Dose Seasonal PF 11/05/2016, 11/08/2017, 11/27/2018, 11/27/2018, 12/30/2019   Influenza-Unspecified  11/05/2016   PFIZER Comirnaty(Gray Top)Covid-19 Tri-Sucrose Vaccine 04/01/2019, 04/22/2019   PFIZER(Purple Top)SARS-COV-2 Vaccination 04/01/2019, 04/22/2019   Pneumococcal Polysaccharide-23 11/25/2020    Past Medical History:  Diagnosis Date   Bronchitis    CHF (congestive heart failure) (HCC)    COPD (chronic obstructive pulmonary disease) (HCC)    Diverticulitis    GERD (gastroesophageal reflux disease)    Hiatal hernia    Oxygen dependent    5L [place] continuous   Wears dentures     full upper, partial lower.  do not fit well    Tobacco History: Social History   Tobacco Use  Smoking Status Former   Current packs/day: 0.00   Average packs/day: 1 pack/day for 40.0 years (40.0 ttl pk-yrs)   Types: Cigarettes   Start date: 10/04/1963   Quit date: 10/04/2003   Years since quitting: 19.1  Smokeless Tobacco Never   Counseling given: Not Answered   Outpatient Medications Prior to Visit  Medication Sig Dispense Refill   albuterol (VENTOLIN HFA) 108 (90 Base) MCG/ACT inhaler Inhale 2 puffs into the lungs every 4 (four) hours as needed for wheezing or shortness of breath. 1 g 3   budesonide (PULMICORT) 0.5 MG/2ML nebulizer solution INHALE 1 VIAL TWICE A DAY 360 mL 2   cetirizine (ZYRTEC) 10 MG tablet Take 1 tablet by mouth daily as needed.     feeding supplement (ENSURE ENLIVE / ENSURE PLUS) LIQD Take 237 mLs by mouth 2 (two) times daily between meals. 237 mL 12   furosemide (LASIX) 40 MG tablet Take 1 tablet (40 mg total) by mouth 2 (two) times daily. 60 tablet 0   ipratropium-albuterol (DUONEB) 0.5-2.5 (3) MG/3ML SOLN Take 3 mLs by nebulization every 6 (six) hours as needed. 360 mL 10   lidocaine (LIDODERM) 5 % Apply 1 patch to area of pain daily.  Remove & Discard patch within 12 hours or as directed 30 patch 0   nebivolol (BYSTOLIC) 5 MG tablet Take 0.5 tablets (2.5 mg total) by mouth daily.     omeprazole (PRILOSEC) 40 MG capsule Take 1 capsule by mouth 2 (two) times daily.     OXYGEN Inhale into the lungs. 5 liters     potassium chloride (KLOR-CON) 10 MEQ tablet Take 1 tablet (10 mEq total) by mouth daily. 30 tablet 0   predniSONE (DELTASONE) 20 MG tablet TAKE 1 TABLET BY MOUTH EVERY DAY WITH BREAKFAST 30 tablet 2   roflumilast (DALIRESP) 500 MCG TABS tablet Take 1 tablet (500 mcg total) by mouth daily. (Patient not taking: Reported on 11/07/2022) 90 tablet 10   No facility-administered medications prior to visit.     Review of Systems:   Constitutional: No  night sweats, fevers, chills. +weight gain, fatigue/lassitude (Baseline) HEENT: No headaches, difficulty swallowing, tooth/dental problems, or sore throat. No sneezing, itching, ear ache, nasal congestion, or post nasal drip CV:   +swelling in lower extremities. No chest pain, orthopnea, PND, anasarca, dizziness, palpitations, syncope Resp: +shortness of breath with exertion; chronic cough; chest congestion. No excess mucus or change in color of mucus. No hemoptysis. No wheezing.  No chest wall deformity GI:  No heartburn, indigestion, abdominal pain, nausea, vomiting, diarrhea, change in bowel habits, loss of appetite, bloody stools.  GU: No dysuria, change in color of urine, urgency or frequency.  No flank pain, no hematuria  Skin: No rash, lesions, ulcerations MSK:  No joint pain or swelling.   Neuro: No dizziness or lightheadedness.  Psych: No depression or anxiety. Mood stable.  Physical Exam:  BP 122/60 (BP Location: Left Arm, Patient Position: Sitting, Cuff Size: Normal)   Pulse 91   Temp 97.6 F (36.4 C) (Temporal)   Ht 5\' 6"  (1.676 m)   Wt 138 lb 6.4 oz (62.8 kg)   SpO2 96%   BMI 22.34 kg/m   GEN: Pleasant, interactive, chronically-ill appearing; in no acute distress HEENT:  Normocephalic and atraumatic. Moon faced. PERRLA. Sclera white. Nasal turbinates pink, moist and patent bilaterally. No rhinorrhea present. Oropharynx pink and moist, without exudate or edema. No lesions, ulcerations, or postnasal drip.  NECK:  Supple w/ fair ROM. No JVD present. Thyroid symmetrical with no goiter or nodules palpated. No lymphadenopathy.   CV: RRR, no m/r/g. +1 BLE edema. Pulses intact, +2 bilaterally. No cyanosis, pallor or clubbing. PULMONARY:  Unlabored, regular breathing. Diminished bilaterally A&P w/o wheezes/rales/rhonchi. No accessory muscle use.  GI: BS present and normoactive. Soft, non-tender to palpation. No organomegaly or masses detected.  MSK: No erythema, warmth or  tenderness. Cap refil <2 sec all extrem. No deformities or joint swelling noted.  Neuro: A/Ox3. No focal deficits noted.   Skin: Warm, no lesions or rashe Psych: Normal affect and behavior.    Lab Results:  CBC    Component Value Date/Time   WBC 12.4 (H) 05/23/2021 1421   RBC 3.44 (L) 05/23/2021 1421   HGB 10.4 (L) 05/23/2021 1421   HGB 14.1 07/18/2013 0940   HCT 33.1 (L) 05/23/2021 1421   HCT 42.2 07/18/2013 0940   PLT 293 05/23/2021 1421   PLT 282 07/18/2013 0940   MCV 96.2 05/23/2021 1421   MCV 91 07/18/2013 0940   MCH 30.2 05/23/2021 1421   MCHC 31.4 05/23/2021 1421   RDW 14.0 05/23/2021 1421   RDW 14.0 07/18/2013 0940   LYMPHSABS 0.5 (L) 05/23/2021 1421   LYMPHSABS 1.2 07/18/2013 0940   MONOABS 0.3 05/23/2021 1421   MONOABS 0.5 07/18/2013 0940   EOSABS 0.0 05/23/2021 1421   EOSABS 0.0 07/18/2013 0940   BASOSABS 0.0 05/23/2021 1421   BASOSABS 0.0 07/18/2013 0940    BMET    Component Value Date/Time   NA 137 11/07/2022 1154   NA 138 07/18/2013 0940   K 4.1 11/07/2022 1154   K 4.7 07/18/2013 0940   CL 91 (L) 11/07/2022 1154   CL 101 07/18/2013 0940   CO2 34 (H) 11/07/2022 1154   CO2 30 07/18/2013 0940   GLUCOSE 91 11/07/2022 1154   GLUCOSE 101 (H) 07/18/2013 0940   BUN 38 (H) 11/07/2022 1154   BUN 14 07/18/2013 0940   CREATININE 1.56 (H) 11/07/2022 1154   CREATININE 1.21 07/18/2013 0940   CALCIUM 9.0 11/07/2022 1154   CALCIUM 9.8 07/18/2013 0940   GFRNONAA 45 (L) 11/07/2022 1154   GFRNONAA >60 07/18/2013 0940   GFRAA >60 05/05/2016 1251   GFRAA >60 07/18/2013 0940    BNP    Component Value Date/Time   BNP 136.3 (H) 11/07/2022 1154     Imaging:  No results found.  Administration History     None           No data to display          No results found for: "NITRICOXIDE"      Assessment & Plan:   Acute on chronic diastolic CHF (congestive heart failure) (HCC) Slight increase in dyspneic symptoms with associated weight gain  and BLE edema, consistent with volume overload. Check BNP and BMET today. Will have him take an additional dose  of lasix for the next 3 days then return to 40 mg Twice daily. Advised to monitor weights at home. Notify cardiology if symptoms do not improve. ED precautions reviewed.   Patient Instructions  Continue Albuterol inhaler 2 puffs every 6 hours as needed for shortness of breath or wheezing. Notify if symptoms persist despite rescue inhaler/neb use.  Continue duonebs 3 mL four times a day  Continue budesonide 2 mL Twice daily. Brush tongue and rinse mouth afterwards Continue supplemental oxygen 4-5 lpm for goal >88-90%  -Start Ohtuvayre nebs Twice daily - someone will contact you regarding this  -You can use guaifenesin twice daily for congestion/cough -Azelastine-fluticasone nasal spray 1 spray each nostril Twice daily for nasal congestion  You need to cut down your steroids. Take 1.5 tablets daily for 7 days then take 1 tablet daily and stay on this (20 mg)  Your weight is up 5 lb since you were at your primary care provider's office in August. Since you're also having increased swelling and some increased shortness of breath, I want you to take an extra dose of lasix in the morning for 3 days then go back to 1 tablet twice a day.   Labs today   Follow up in 8 weeks with Dr. Belia Heman or Philis Nettle. If symptoms do not improve or worsen, please contact office for sooner follow up or seek emergency care.    COPD, severe (HCC) Severe COPD with high symptom burden. No bronchospasm on exam. Symptoms today seem to be more consistent with volume overload. No infectious symptoms. Will obtain imaging if he does not improve. See above plan. He was intolerant of daliresp. Will see if he can tolerate inhaled PDE4 therapy with Ohtuvayre. Medication education/side effect profile reviewed. Rx submitted today. We had a lengthy discussion surrounding his steroid use. He is currently taking high doses  daily. Discussed the risks of this. He will begin tapering back to 20 mg daily, which has been his baseline. We will reassess at follow up. Action plan in place. Encouraged to work on graded exercises, as tolerated.   Chronic respiratory failure with hypoxia (HCC) Stable without increased O2 requirement. Goal >88-90%  Allergic rhinitis Add on intranasal steroid/antihistamine. Encouraged to continue daily allergy pill.    I spent 45 minutes of dedicated to the care of this patient on the date of this encounter to include pre-visit review of records, face-to-face time with the patient discussing conditions above, post visit ordering of testing, clinical documentation with the electronic health record, making appropriate referrals as documented, and communicating necessary findings to members of the patients care team.  Noemi Chapel, NP 11/07/2022  Pt aware and understands NP's role.

## 2022-11-09 ENCOUNTER — Telehealth: Payer: Self-pay

## 2022-11-09 NOTE — Telephone Encounter (Signed)
Patient was seen in the office on 11/08/2022.  Micheline Maze, NP has ordered [facility] for the patient.  He has signed the form and it has been faxed. Per [employer]- Patient ID [phone]  Nothing further needed.

## 2022-11-11 ENCOUNTER — Other Ambulatory Visit: Payer: Self-pay | Admitting: Internal Medicine

## 2022-11-21 ENCOUNTER — Other Ambulatory Visit: Payer: Self-pay | Admitting: Internal Medicine

## 2022-11-21 DIAGNOSIS — J441 Chronic obstructive pulmonary disease with (acute) exacerbation: Secondary | ICD-10-CM

## 2022-11-21 DIAGNOSIS — J9611 Chronic respiratory failure with hypoxia: Secondary | ICD-10-CM

## 2022-11-29 ENCOUNTER — Telehealth: Payer: Self-pay | Admitting: Internal Medicine

## 2022-11-29 ENCOUNTER — Other Ambulatory Visit: Payer: Self-pay | Admitting: Internal Medicine

## 2022-11-29 DIAGNOSIS — R0689 Other abnormalities of breathing: Secondary | ICD-10-CM

## 2022-11-29 DIAGNOSIS — J449 Chronic obstructive pulmonary disease, unspecified: Secondary | ICD-10-CM

## 2022-11-29 DIAGNOSIS — J9611 Chronic respiratory failure with hypoxia: Secondary | ICD-10-CM

## 2022-11-29 MED ORDER — IPRATROPIUM-ALBUTEROL 0.5-2.5 (3) MG/3ML IN SOLN: 3.0000 mL | Freq: Four times a day (QID) | RESPIRATORY_TRACT | 11 refills | Status: AC | PRN

## 2022-11-29 MED ORDER — IPRATROPIUM-ALBUTEROL 0.5-2.5 (3) MG/3ML IN SOLN: 3.0000 mL | Freq: Four times a day (QID) | RESPIRATORY_TRACT | 11 refills | Status: DC | PRN

## 2022-11-29 NOTE — Telephone Encounter (Signed)
Unfortunately, there is nothing to replace the [facility] with.  We can call in a prescription for his albuterol MDI rescue.  Continue DuoNeb and Pulmicort.

## 2022-11-29 NOTE — Telephone Encounter (Signed)
I spoke with the patient. He had to stop the [facility] due to stomach pain. He said once he stopped taking the patient has gradually got better. He does not need a refill on the Budesonide just the DuoNeb.  Duoneb sent into [facility].  Nothing further needed.

## 2022-11-29 NOTE — Telephone Encounter (Signed)
Charles Webster is giving him stomach pain so he has been utilizing his Albuterol Now he is out and [facility] told him we need to renew his RX for him to get it. Also, what may replace the [facility].  Pls call PT at [phone]

## 2022-12-18 ENCOUNTER — Telehealth: Payer: Self-pay | Admitting: Internal Medicine

## 2022-12-18 NOTE — Telephone Encounter (Signed)
PT needs a note from the Dr. 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 If he can get this he can renew on line rather than waiting in line.  PT states he would like it mailed. I verified his home address.

## 2022-12-18 NOTE — Telephone Encounter (Signed)
Okay write letter for the patient?

## 2022-12-18 NOTE — Telephone Encounter (Signed)
PT would like a call once it is completed and mailed.

## 2022-12-19 NOTE — Telephone Encounter (Signed)
Letter should be addressed "To Whom it may Concern: Mr. 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 Thanks.

## 2022-12-19 NOTE — Telephone Encounter (Signed)
Letter has been printed and mailed. I have notified the patient.  Nothing further needed.

## 2022-12-19 NOTE — Telephone Encounter (Signed)
Yes, please.

## 2023-01-04 ENCOUNTER — Encounter: Payer: Self-pay | Admitting: Nurse Practitioner

## 2023-01-04 ENCOUNTER — Ambulatory Visit: Payer: Medicare Other | Admitting: Nurse Practitioner

## 2023-01-04 VITALS — BP 122/78 | HR 117 | Temp 96.9°F | Ht 66.0 in | Wt 136.2 lb

## 2023-01-04 DIAGNOSIS — J301 Allergic rhinitis due to pollen: Secondary | ICD-10-CM

## 2023-01-04 DIAGNOSIS — J9611 Chronic respiratory failure with hypoxia: Secondary | ICD-10-CM

## 2023-01-04 DIAGNOSIS — J449 Chronic obstructive pulmonary disease, unspecified: Secondary | ICD-10-CM | POA: Diagnosis not present

## 2023-01-04 DIAGNOSIS — I5033 Acute on chronic diastolic (congestive) heart failure: Secondary | ICD-10-CM

## 2023-01-04 NOTE — Patient Instructions (Addendum)
Continue Albuterol inhaler 2 puffs every 6 hours as needed for shortness of breath or wheezing. Notify if symptoms persist despite rescue inhaler/neb use.  Continue duonebs 3 mL four times a day  Continue budesonide 2 mL Twice daily. Brush tongue and rinse mouth afterwards Continue prednisone 10 mg daily. Take in AM with food Continue lasix as prescribed by your cardiologist. Monitor your weights and swelling at home. Notify your heart doctor if you gain 2-3 lb overnight or 5 lb in a week  Continue supplemental oxygen 4-5 lpm for goal >88-90%. Monitor oxygen levels at home  -You can use guaifenesin twice daily for congestion/cough -Continue azelastine-fluticasone nasal spray 1 spray each nostril Twice daily for nasal congestion  Ok to stay off Ohtuvayre   Follow up with your heart doctor   Follow up in 3 months with Dr. Belia Heman or Philis Nettle. If symptoms do not improve or worsen, please contact office for sooner follow up or seek emergency care.

## 2023-01-04 NOTE — Progress Notes (Unsigned)
@Patient  ID: Charles Webster, male    DOB: 02-18-1945, 77 y.o.   MRN: [id number]  Chief Complaint  Patient presents with   Follow-up    DOE. Wheezing. Cough with clear sputum.     Referring provider: Marina Goodell, MD  HPI: 77 year old male, former smoker followed for COPD, chronic respiratory failure on supplemental O2.  He is a patient of Dr. Clovis Fredrickson and last seen in office 11/07/2022.  Past medical history significant for CHF, allergic rhinitis, hiatal hernia, GERD.  TEST/EVENTS:  12/05/2017 PFT: FVC 66, FEV1 35, ratio 38, TLC 97.  Severe obstructive airway disease with reversibility (13% change) 09/11/2020 echo: EF 55 to 60%.  G1 DD.  RV size and function is normal. 05/23/2021 CT chest without contrast: Acute right clavicle fracture, subacute or chronic fractures to the left ribs.  Multiple thoracic spine compression fractures of varying acuity.  Postsurgical changes in the right hemithorax with volume loss, scarring and chronic pleural thickening.  Unchanged peripherally calcified fluid density at the right lung apex.  Advanced emphysema.  04/17/2022: OV with Dr. Belia Heman.  Severe COPD.  Chronic dyspnea on exertion.  Does really well with pulmonary rehab.  On 5 L of oxygen.  He has had multiple bouts of COPD exacerbation in the past.  He is on long-term prednisone therapy 20 mg daily.  Refuses to come off prednisone at this time but he is willing to try to taper as tolerated.  Did not tolerate traditional inhaler therapy before so he was switched to nebulized therapies with Pulmicort and DuoNebs.  Added on Daliresp.  11/07/2022: Today-follow-up Patient presents today for follow-up.  He has been feeling about the same since he was here last time.  Having a little more trouble with his breathing today.  He actually felt okay yesterday.  He feels like he is having some more swelling in his lower legs.  Not having much of an increased cough compared to his baseline.  Still has daily congestion and  produces clear phlegm from time to time.  He was unable to tolerate the Daliresp due to GI side effects.  He has persistent sinus congestion and drainage, which he thinks contributes to his cough. He denies any fevers, chills, hemoptysis, orthopnea, chest pain.  No sinus tenderness. He is using his budesonide twice a day and DuoNebs 4 times a day.  He is only supposed to be on 20 mg of prednisone daily and was supposed to tapering down; however, he tells me today that he is actually taking 20 mg twice a day for total of 40 mg daily.  Not entirely sure when he started this.  He takes 40 mg of Lasix twice a day.  He is on 5 L of oxygen, which is his baseline.  Allergies  Allergen Reactions   Alpha Blocker Quinazolines Shortness Of Breath   Bromelains Nausea Only   Clindamycin/Lincomycin Nausea Only   Doxycycline Nausea Only   Levaquin [Levofloxacin] Nausea Only   Penicillins    Singulair [Montelukast Sodium] Other (See Comments)    Flu like symptoms   Sulfa Antibiotics Nausea Only   Tamsulosin Nausea Only   Zinc Nausea Only    Immunization History  Administered Date(s) Administered   Fluad Quad(high Dose 65+) 11/23/2020   Influenza, High Dose Seasonal PF 11/05/2016, 11/08/2017, 11/27/2018, 11/27/2018, 12/30/2019   Influenza-Unspecified 11/05/2016   PFIZER Comirnaty(Gray Top)Covid-19 Tri-Sucrose Vaccine 04/01/2019, 04/22/2019   PFIZER(Purple Top)SARS-COV-2 Vaccination 04/01/2019, 04/22/2019   Pneumococcal Polysaccharide-23 11/25/2020    Past  Medical History:  Diagnosis Date   Bronchitis    CHF (congestive heart failure) (HCC)    COPD (chronic obstructive pulmonary disease) (HCC)    Diverticulitis    GERD (gastroesophageal reflux disease)    Hiatal hernia    Oxygen dependent    5L [place] continuous   Wears dentures    full upper, partial lower.  do not fit well    Tobacco History: Social History   Tobacco Use  Smoking Status Former   Current packs/day: 0.00   Average packs/day:  1 pack/day for 40.0 years (40.0 ttl pk-yrs)   Types: Cigarettes   Start date: 10/04/1963   Quit date: 10/04/2003   Years since quitting: 19.2  Smokeless Tobacco Never   Counseling given: Not Answered   Outpatient Medications Prior to Visit  Medication Sig Dispense Refill   albuterol (VENTOLIN HFA) 108 (90 Base) MCG/ACT inhaler Inhale 2 puffs into the lungs every 4 (four) hours as needed for wheezing or shortness of breath. 1 g 3   Azelastine-Fluticasone 137-50 MCG/ACT SUSP Place 1 spray into the nose 2 (two) times daily. 23 g 3   budesonide (PULMICORT) 0.5 MG/2ML nebulizer solution INHALE 1 VIAL VIA NEBULIZATION TWICE A DAY 360 mL 6   cetirizine (ZYRTEC) 10 MG tablet Take 1 tablet by mouth daily as needed.     feeding supplement (ENSURE ENLIVE / ENSURE PLUS) LIQD Take 237 mLs by mouth 2 (two) times daily between meals. 237 mL 12   furosemide (LASIX) 40 MG tablet Take 1 tablet (40 mg total) by mouth 2 (two) times daily. 60 tablet 0   ipratropium-albuterol (DUONEB) 0.5-2.5 (3) MG/3ML SOLN Take 3 mLs by nebulization every 6 (six) hours as needed. 360 mL 11   lidocaine (LIDODERM) 5 % Apply 1 patch to area of pain daily.  Remove & Discard patch within 12 hours or as directed 30 patch 0   nebivolol (BYSTOLIC) 5 MG tablet Take 0.5 tablets (2.5 mg total) by mouth daily.     omeprazole (PRILOSEC) 40 MG capsule Take 1 capsule by mouth 2 (two) times daily.     OXYGEN Inhale into the lungs. 5 liters     potassium chloride (KLOR-CON) 10 MEQ tablet Take 1 tablet (10 mEq total) by mouth daily. 30 tablet 0   predniSONE (DELTASONE) 20 MG tablet TAKE 1 TABLET BY MOUTH EVERY DAY WITH BREAKFAST (Patient taking differently: Take 10 mg by mouth daily. TAKE 1 TABLET BY MOUTH EVERY DAY WITH BREAKFAST) 30 tablet 2   Ensifentrine (OHTUVAYRE) 3 MG/2.5ML SUSP Inhale 2.5 mLs into the lungs in the morning and at bedtime. (Patient not taking: Reported on 01/04/2023)     No facility-administered medications prior to visit.      Review of Systems:   Constitutional: No night sweats, fevers, chills. +weight gain, fatigue/lassitude (Baseline) HEENT: No headaches, difficulty swallowing, tooth/dental problems, or sore throat. No sneezing, itching, ear ache, nasal congestion, or post nasal drip CV:   +swelling in lower extremities. No chest pain, orthopnea, PND, anasarca, dizziness, palpitations, syncope Resp: +shortness of breath with exertion; chronic cough; chest congestion. No excess mucus or change in color of mucus. No hemoptysis. No wheezing.  No chest wall deformity GI:  No heartburn, indigestion, abdominal pain, nausea, vomiting, diarrhea, change in bowel habits, loss of appetite, bloody stools.  GU: No dysuria, change in color of urine, urgency or frequency.  No flank pain, no hematuria  Skin: No rash, lesions, ulcerations MSK:  No joint pain or swelling.  Neuro: No dizziness or lightheadedness.  Psych: No depression or anxiety. Mood stable.     Physical Exam:  BP 122/78 (BP Location: Right Arm, Cuff Size: Normal)   Pulse (!) 117   Temp (!) 96.9 F (36.1 C)   Ht 5\' 6"  (1.676 m)   Wt 136 lb 3.2 oz (61.8 kg)   SpO2 97%   BMI 21.98 kg/m   GEN: Pleasant, interactive, chronically-ill appearing; in no acute distress HEENT:  Normocephalic and atraumatic. Moon faced. PERRLA. Sclera white. Nasal turbinates pink, moist and patent bilaterally. No rhinorrhea present. Oropharynx pink and moist, without exudate or edema. No lesions, ulcerations, or postnasal drip.  NECK:  Supple w/ fair ROM. No JVD present. Thyroid symmetrical with no goiter or nodules palpated. No lymphadenopathy.   CV: RRR, no m/r/g. +1 BLE edema. Pulses intact, +2 bilaterally. No cyanosis, pallor or clubbing. PULMONARY:  Unlabored, regular breathing. Diminished bilaterally A&P w/o wheezes/rales/rhonchi. No accessory muscle use.  GI: BS present and normoactive. Soft, non-tender to palpation. No organomegaly or masses detected.  MSK: No  erythema, warmth or tenderness. Cap refil <2 sec all extrem. No deformities or joint swelling noted.  Neuro: A/Ox3. No focal deficits noted.   Skin: Warm, no lesions or rashe Psych: Normal affect and behavior.    Lab Results:  CBC    Component Value Date/Time   WBC 12.4 (H) 05/23/2021 1421   RBC 3.44 (L) 05/23/2021 1421   HGB 10.4 (L) 05/23/2021 1421   HGB 14.1 07/18/2013 0940   HCT 33.1 (L) 05/23/2021 1421   HCT 42.2 07/18/2013 0940   PLT 293 05/23/2021 1421   PLT 282 07/18/2013 0940   MCV 96.2 05/23/2021 1421   MCV 91 07/18/2013 0940   MCH 30.2 05/23/2021 1421   MCHC 31.4 05/23/2021 1421   RDW 14.0 05/23/2021 1421   RDW 14.0 07/18/2013 0940   LYMPHSABS 0.5 (L) 05/23/2021 1421   LYMPHSABS 1.2 07/18/2013 0940   MONOABS 0.3 05/23/2021 1421   MONOABS 0.5 07/18/2013 0940   EOSABS 0.0 05/23/2021 1421   EOSABS 0.0 07/18/2013 0940   BASOSABS 0.0 05/23/2021 1421   BASOSABS 0.0 07/18/2013 0940    BMET    Component Value Date/Time   NA 137 11/07/2022 1154   NA 138 07/18/2013 0940   K 4.1 11/07/2022 1154   K 4.7 07/18/2013 0940   CL 91 (L) 11/07/2022 1154   CL 101 07/18/2013 0940   CO2 34 (H) 11/07/2022 1154   CO2 30 07/18/2013 0940   GLUCOSE 91 11/07/2022 1154   GLUCOSE 101 (H) 07/18/2013 0940   BUN 38 (H) 11/07/2022 1154   BUN 14 07/18/2013 0940   CREATININE 1.56 (H) 11/07/2022 1154   CREATININE 1.21 07/18/2013 0940   CALCIUM 9.0 11/07/2022 1154   CALCIUM 9.8 07/18/2013 0940   GFRNONAA 45 (L) 11/07/2022 1154   GFRNONAA >60 07/18/2013 0940   GFRAA >60 05/05/2016 1251   GFRAA >60 07/18/2013 0940    BNP    Component Value Date/Time   BNP 136.3 (H) 11/07/2022 1154     Imaging:  No results found.  Administration History     None           No data to display          No results found for: "NITRICOXIDE"      Assessment & Plan:   No problem-specific Assessment & Plan notes found for this encounter.    I spent 45 minutes of dedicated to  the care  of this patient on the date of this encounter to include pre-visit review of records, face-to-face time with the patient discussing conditions above, post visit ordering of testing, clinical documentation with the electronic health record, making appropriate referrals as documented, and communicating necessary findings to members of the patients care team.  Noemi Chapel, NP 01/04/2023  Pt aware and understands NP's role.

## 2023-01-07 ENCOUNTER — Encounter: Payer: Self-pay | Admitting: Nurse Practitioner

## 2023-01-07 NOTE — Assessment & Plan Note (Signed)
Stable without increased O2 requirement. He was able to maintain saturations >90% on 5 lpm POC today. Need to monitor ability to maintain saturations on pulsed dosed therapy. At some point, he may not be able to tolerate this and will need continuous therapy. Could consider oxymizer [place]. No change for now. Goal >88-90%

## 2023-01-07 NOTE — Assessment & Plan Note (Signed)
Clinically improved. Weight down 2 lb from last OV. Appears euvolemic today. Advised to monitor weights at home and continue diuretic regimen, as prescribed by cardiology. Follow up with cardiology as scheduled.  Patient Instructions  Continue Albuterol inhaler 2 puffs every 6 hours as needed for shortness of breath or wheezing. Notify if symptoms persist despite rescue inhaler/neb use.  Continue duonebs 3 mL four times a day  Continue budesonide 2 mL Twice daily. Brush tongue and rinse mouth afterwards Continue prednisone 10 mg daily. Take in AM with food Continue lasix as prescribed by your cardiologist. Monitor your weights and swelling at home. Notify your heart doctor if you gain 2-3 lb overnight or 5 lb in a week  Continue supplemental oxygen 4-5 lpm for goal >88-90%. Monitor oxygen levels at home  -You can use guaifenesin twice daily for congestion/cough -Continue azelastine-fluticasone nasal spray 1 spray each nostril Twice daily for nasal congestion  Ok to stay off Ohtuvayre   Follow up with your heart doctor   Follow up in 3 months with Dr. Belia Heman or Philis Nettle. If symptoms do not improve or worsen, please contact office for sooner follow up or seek emergency care.

## 2023-01-07 NOTE — Assessment & Plan Note (Signed)
Severe COPD with high symptom burden. Tried on inhaled PDE4 inhibitor but unable to tolerate due to GI side effects. He has titrated down to 10 mg prednisone and will remain on this for now. Encouraged to work on graded exercises. Continue aggressive maintenance regimen. Action plan in place.

## 2023-01-07 NOTE — Assessment & Plan Note (Signed)
Stable on current regimen   

## 2023-01-24 ENCOUNTER — Telehealth: Payer: Self-pay | Admitting: Internal Medicine

## 2023-01-24 NOTE — Telephone Encounter (Signed)
Patient has stopped taking Ohtuyve. They were having a lot of stomach pain.

## 2023-01-24 NOTE — Telephone Encounter (Signed)
Lm x1 for patient.  

## 2023-01-25 NOTE — Telephone Encounter (Signed)
Called and spoke to patient.  He wanted to make Dr. Belia Heman aware that he stopped [facility] due to stomach pain. Since stopping it, the stomach pain has subsided. He stated that he discussed this with Florentina Addison as well during his visit on 12/6. He is aware that Dr. Belia Heman is out of the office until 02/07/23.  Routing to Dr. Belia Heman as an Lorain Childes.

## 2023-02-15 ENCOUNTER — Other Ambulatory Visit: Payer: Self-pay | Admitting: Internal Medicine

## 2023-04-04 ENCOUNTER — Encounter: Payer: Self-pay | Admitting: Internal Medicine

## 2023-04-04 ENCOUNTER — Ambulatory Visit: Payer: Medicare Other | Admitting: Internal Medicine

## 2023-04-04 VITALS — BP 116/74 | HR 89 | Temp 97.6°F | Ht 66.0 in | Wt 136.8 lb

## 2023-04-04 DIAGNOSIS — J449 Chronic obstructive pulmonary disease, unspecified: Secondary | ICD-10-CM

## 2023-04-04 DIAGNOSIS — Z8616 Personal history of COVID-19: Secondary | ICD-10-CM

## 2023-04-04 DIAGNOSIS — J441 Chronic obstructive pulmonary disease with (acute) exacerbation: Secondary | ICD-10-CM

## 2023-04-04 DIAGNOSIS — J9611 Chronic respiratory failure with hypoxia: Secondary | ICD-10-CM | POA: Diagnosis not present

## 2023-04-04 DIAGNOSIS — R0689 Other abnormalities of breathing: Secondary | ICD-10-CM

## 2023-04-04 MED ORDER — BUDESONIDE 0.5 MG/2ML IN SUSP: RESPIRATORY_TRACT | 6 refills | Status: AC

## 2023-04-04 NOTE — Progress Notes (Signed)
 Name: Charles Webster MRN: [id number] DOB: 30-Apr-1945     CONSULTATION DATE: 5.9.19 REFERRING MD : Quentin Cornwall  STUDIES:   4.7.18   CXR independently reviewed by Me  Increased lung volumes No pneumonia No effusions  Previous HISTORY OF PRESENT ILLNESS: 78 year old pleasant white male seen today for assessment for COPD Patient states he has been diagnosed with COPD for approximately 2 years ago however based on further questioning patient has chronic shortness of breath and progressive dyspnea exertion for the past 5 years  Patient has a history of tobacco abuse 1 pack a day for 40 years Patient worked at the [employer] and was exposed to paints 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  Patient states 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 at [facility]  09/2020 COVID infection and ICU admission for COPD and CHF exacerbation  CC Follow-up assessment for severe COPD Follow-up assessment for chronic hypoxic respiratory failure  Follow-up severe COPD Follow-up chronic hypoxic respiratory failure   HPI Severe COPD follow-up assessment Shortness of breath and dyspnea on exertion Previous admission for COVID infection heart failure exacerbation several years ago Did  really well with pulmonary rehab 5 L of oxygen now Multiple bouts of COPD exacerbation in the past Patient on long-term prednisone therapy 10 mg daily Patient refuses to come off of prednisone at this time but he says he will try to taper as tolerated  Patient did not tolerate traditional inhaler therapy therefore was switched to nebulized therapy Pulmicort twice daily DuoNebs every 4 This regimen seems to help him the most   No exacerbation at this time No evidence of heart failure at this time No evidence or signs of infection at this time No respiratory distress No fevers, chills, nausea, vomiting, diarrhea No evidence of lower  extremity edema No evidence hemoptysis   Stopped taking traditional inhaler therapy due to poor respiratory insufficiency  Chronic Hypoxic resp failure due to COPD -recommend using oxygen as prescribed -patient needs this for survival     Patient has failed all types of inhaler therapy as well as albuterol neb therapy  Outpatient Encounter Medications as of 04/04/2023  Medication Sig   albuterol (VENTOLIN HFA) 108 (90 Base) MCG/ACT inhaler Inhale 2 puffs into the lungs every 4 (four) hours as needed for wheezing or shortness of breath.   Azelastine-Fluticasone 137-50 MCG/ACT SUSP Place 1 spray into the nose 2 (two) times daily.   budesonide (PULMICORT) 0.5 MG/2ML nebulizer solution INHALE 1 VIAL VIA NEBULIZATION TWICE A DAY   cetirizine (ZYRTEC) 10 MG tablet Take 1 tablet by mouth daily as needed.   feeding supplement (ENSURE ENLIVE / ENSURE PLUS) LIQD Take 237 mLs by mouth 2 (two) times daily between meals.   furosemide (LASIX) 40 MG tablet Take 1 tablet (40 mg total) by mouth 2 (two) times daily.   ipratropium-albuterol (DUONEB) 0.5-2.5 (3) MG/3ML SOLN Take 3 mLs by nebulization every 6 (six) hours as needed.   lidocaine (LIDODERM) 5 % Apply 1 patch to area of pain daily.  Remove & Discard patch within 12 hours or as directed   nebivolol (BYSTOLIC) 5 MG tablet Take 0.5 tablets (2.5 mg total) by mouth daily.   omeprazole (PRILOSEC) 40 MG capsule Take 1 capsule by mouth 2 (two) times daily.   OXYGEN Inhale into the lungs. 5 liters   potassium chloride (KLOR-CON) 10 MEQ tablet Take 1 tablet (10 mEq total) by mouth  daily.   predniSONE (DELTASONE) 20 MG tablet TAKE 1 TABLET BY MOUTH EVERY DAY WITH BREAKFAST   No facility-administered encounter medications on file as of 04/04/2023.    BP 116/74 (BP Location: Left Arm, Patient Position: Sitting, Cuff Size: Normal)   Pulse 89   Temp 97.6 F (36.4 C) (Temporal)   Ht 5\' 6"  (1.676 m)   Wt 136 lb 12.8 oz (62.1 kg)   SpO2 96%   BMI 22.08  kg/m      Review of Systems: Gen:  Denies  fever, sweats, chills weight loss  HEENT: Denies blurred vision, double vision, ear pain, eye pain, hearing loss, nose bleeds, sore throat Cardiac:  No dizziness, chest pain or heaviness, chest tightness,edema, No JVD Resp:   + Shortness of breath + dyspnea on exertion Other:  All other systems negative   Physical Examination:   General Appearance: No distress  EYES PERRLA, EOM intact.   NECK Supple, No JVD Pulmonary: normal breath sounds, No wheezing.  CardiovascularNormal S1,S2.  No m/r/g.   Abdomen: Benign, Soft, non-tender. Neurology UE/LE 5/5 strength, no focal deficits Ext pulses intact, cap refill intact ALL OTHER ROS ARE NEGATIVE    ASSESSMENT / PLAN:  78 year old pleasant white male seen today for end-stage COPD Gold stage D with recurrent bouts of COPD exacerbation associated with chronic hypoxic respiratory failure and severe respiratory insufficiency    End-stage COPD Gold stage D  Continue nebulized therapy as maintenance therapy  Patient would like to continue longstanding prednisone therapy  We have discussed decreasing the dosage in the next 6 months Patient would like to continue 10 mg daily Avoid Allergens and Irritants Avoid secondhand smoke Avoid SICK contacts Recommend  Masking  when appropriate Recommend Keep up-to-date with vaccinations  Chronic Hypoxic resp failure due to COPD -Patient benefits from oxygen therapy 5L [place]  -recommend using oxygen as prescribed -patient needs this for survival   Patient on long-term prednisone therapy 10 mg daily Patient refuses to come off of prednisone at this time but he says he will try to taper as tolerated    MEDICATION ADJUSTMENTS/LABS AND TESTS ORDERED: Continue oxygen as prescribed Continue prednisone as prescribed Continue nebulizers as prescribed Avoid secondhand smoke Avoid SICK contacts Recommend  Masking  when appropriate Recommend Keep  up-to-date with vaccinations   CURRENT MEDICATIONS REVIEWED AT LENGTH WITH PATIENT TODAY   Patient  satisfied with Plan of action and management. All questions answered   Follow up  6 months   I spent a total of 41 minutes reviewing chart data, face-to-face evaluation with the patient, counseling and coordination of care as detailed above.        Lucie Leather, M.D.  [facility]  Medical Director [facility] Medical Director [facility] Cardio-Pulmonary Department

## 2023-04-04 NOTE — Patient Instructions (Signed)
 Continue oxygen as prescribed Continue prednisone as prescribed Continue nebulizers as prescribed Avoid secondhand smoke Avoid SICK contacts Recommend  Masking  when appropriate Recommend Keep up-to-date with vaccinations

## 2023-04-12 ENCOUNTER — Telehealth: Payer: Self-pay | Admitting: Internal Medicine

## 2023-04-12 DIAGNOSIS — J9611 Chronic respiratory failure with hypoxia: Secondary | ICD-10-CM

## 2023-04-12 DIAGNOSIS — J449 Chronic obstructive pulmonary disease, unspecified: Secondary | ICD-10-CM

## 2023-04-12 DIAGNOSIS — R0689 Other abnormalities of breathing: Secondary | ICD-10-CM

## 2023-04-12 NOTE — Telephone Encounter (Signed)
 Patient states needs oxygen machine to go up to 5 liters. Patient uses [facility] for oxygen. Patient phone number is [phone].

## 2023-04-12 NOTE — Telephone Encounter (Signed)
 I spoke with the patient he said his O2 concentrator alarm was going off. When someone from [facility] came out to check on it they told him the O2 concentrator he has is not for someone on 5L of O2. His was for someone on 2L of O2. They told him he needs a new concentrator.   Okay to place order for lager concentrator?

## 2023-04-12 NOTE — Telephone Encounter (Signed)
 This is a patient of Dr. Clovis Fredrickson.  Okay to place order for larger capacity concentrator.  Please make sure that [facility] understands that oxygen CMN's have to be signed by Dr. Belia Heman.

## 2023-04-15 NOTE — Telephone Encounter (Signed)
 Per verbal from Dr. Belia Heman, ok to send order.  Order has been placed and I have notified the patient.  Nothing further needed.

## 2023-08-07 ENCOUNTER — Other Ambulatory Visit: Payer: Self-pay | Admitting: Nephrology

## 2023-08-07 DIAGNOSIS — D631 Anemia in chronic kidney disease: Secondary | ICD-10-CM

## 2023-08-07 DIAGNOSIS — N1832 Chronic kidney disease, stage 3b: Secondary | ICD-10-CM

## 2023-08-14 ENCOUNTER — Ambulatory Visit
Admission: RE | Admit: 2023-08-14 | Discharge: 2023-08-14 | Disposition: A | Discharge status: Home / Self Care | Source: Ambulatory Visit | Attending: Nephrology | Admitting: Nephrology

## 2023-08-14 DIAGNOSIS — N1832 Chronic kidney disease, stage 3b: Secondary | ICD-10-CM | POA: Insufficient documentation

## 2023-08-14 DIAGNOSIS — D631 Anemia in chronic kidney disease: Secondary | ICD-10-CM | POA: Diagnosis present

## 2023-10-23 IMAGING — CR DG LUMBAR SPINE COMPLETE 4+V
5 series · 5 of 5 positions shown · non-contrast
Comparison: None.

CLINICAL DATA: Back pain

EXAM:
LUMBAR SPINE - COMPLETE 4+ VIEW

[l-spine ap]
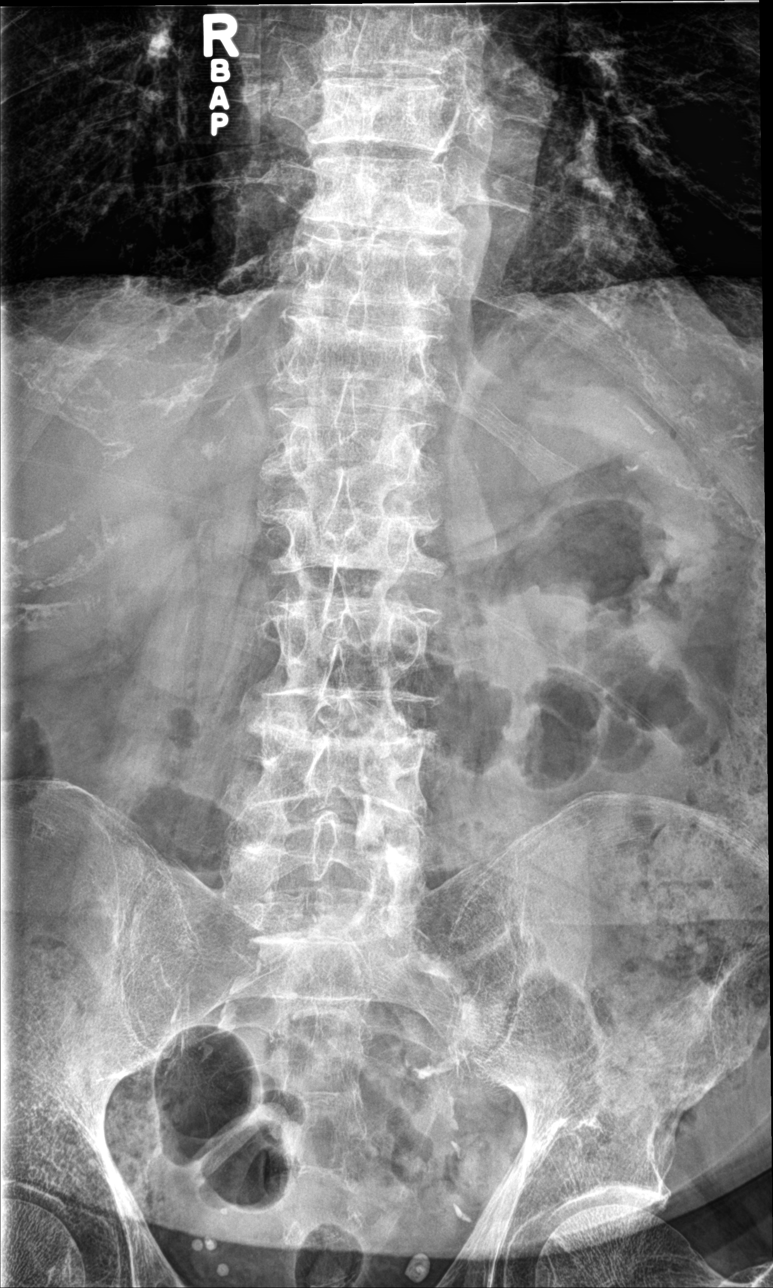

[l-spine obl (1 of 2)]
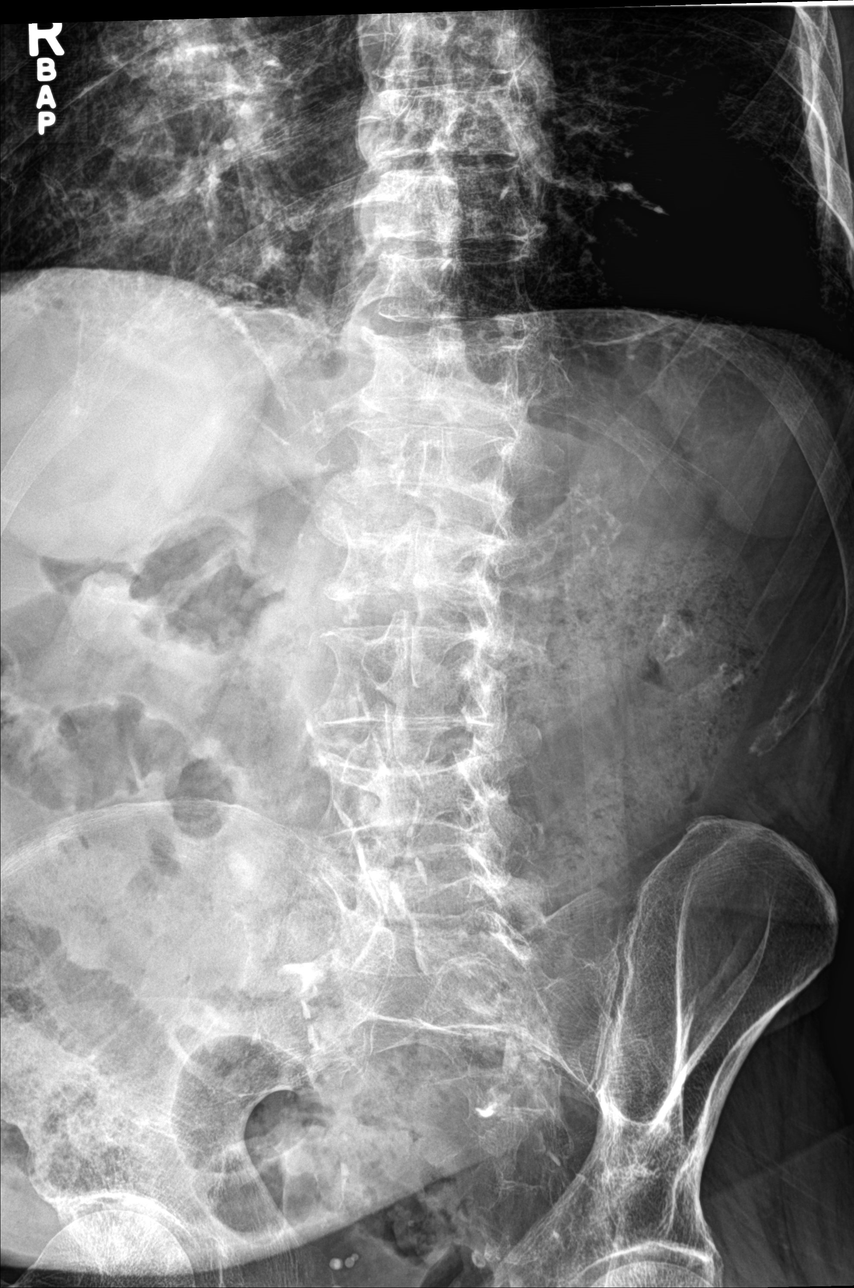

[l-spine obl (2 of 2)]
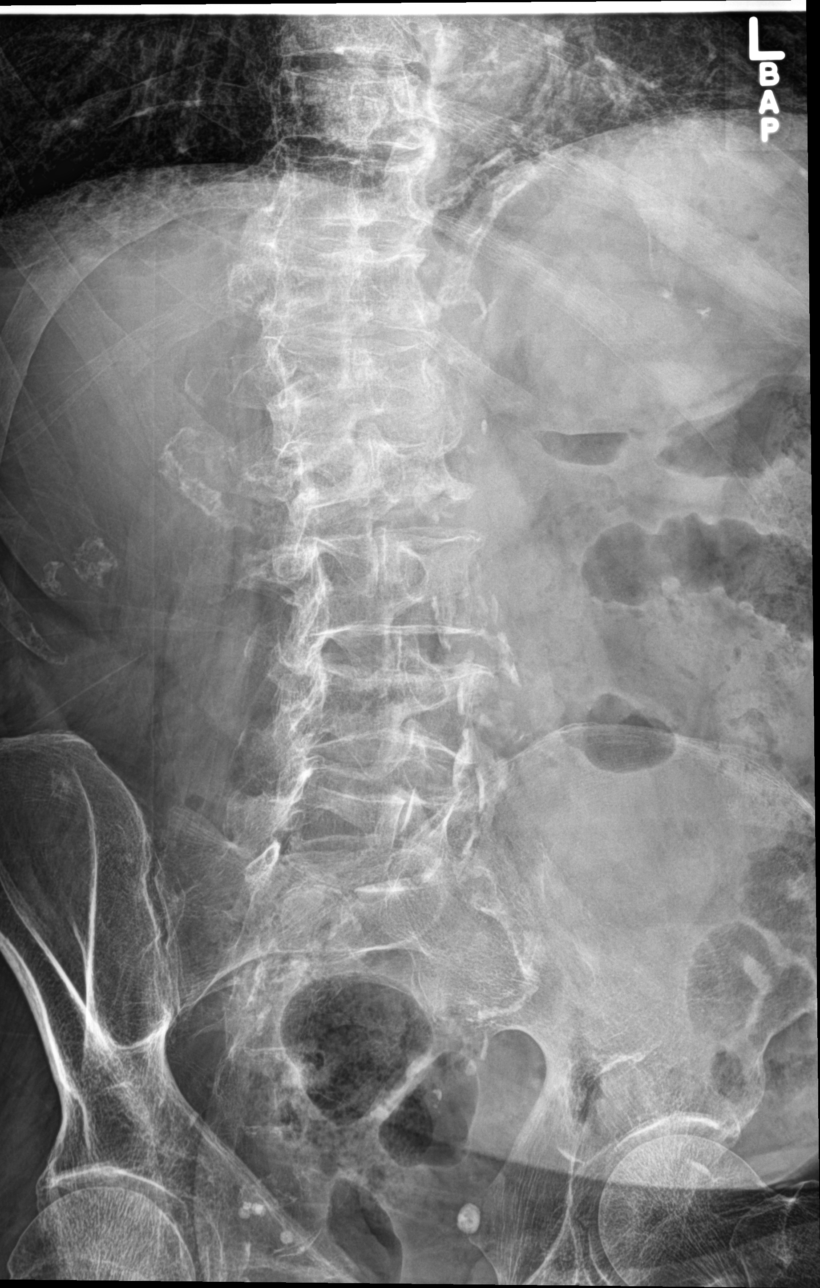

[l-spine lat]
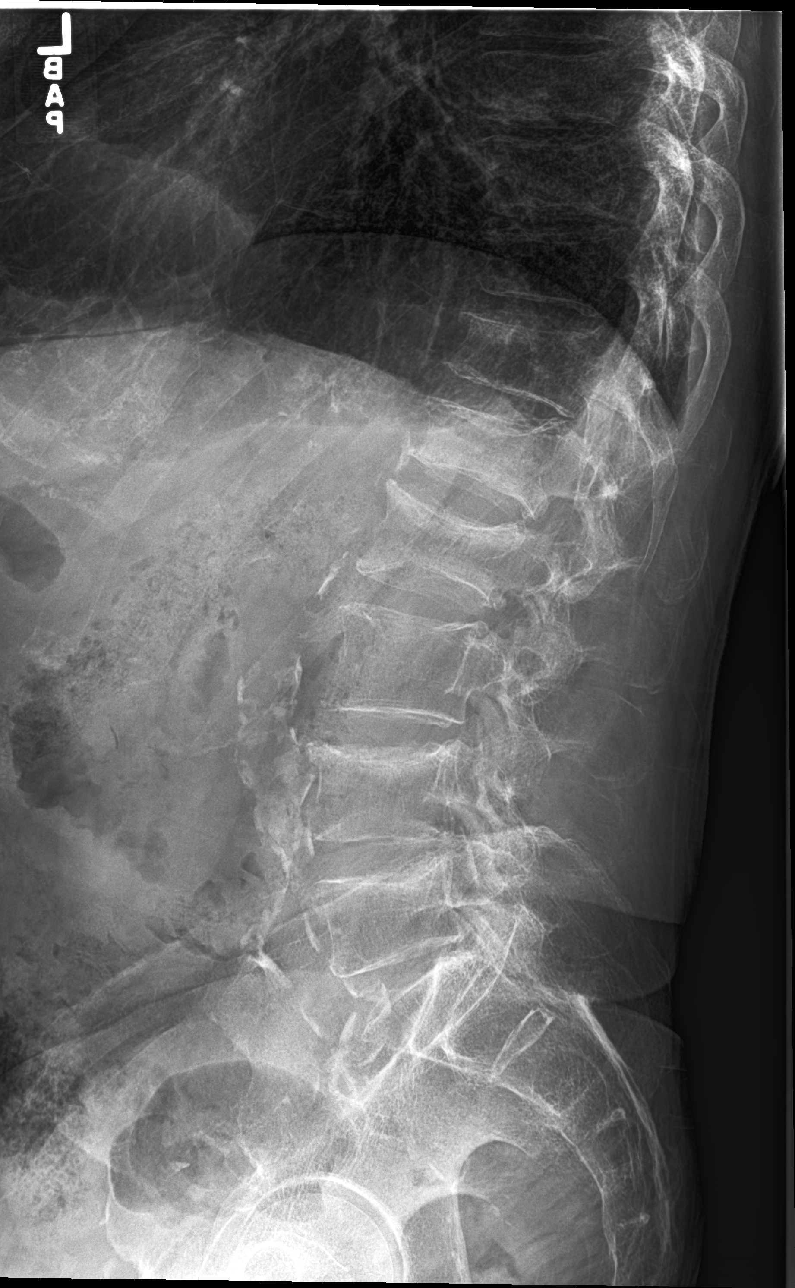

[l-spine spot]
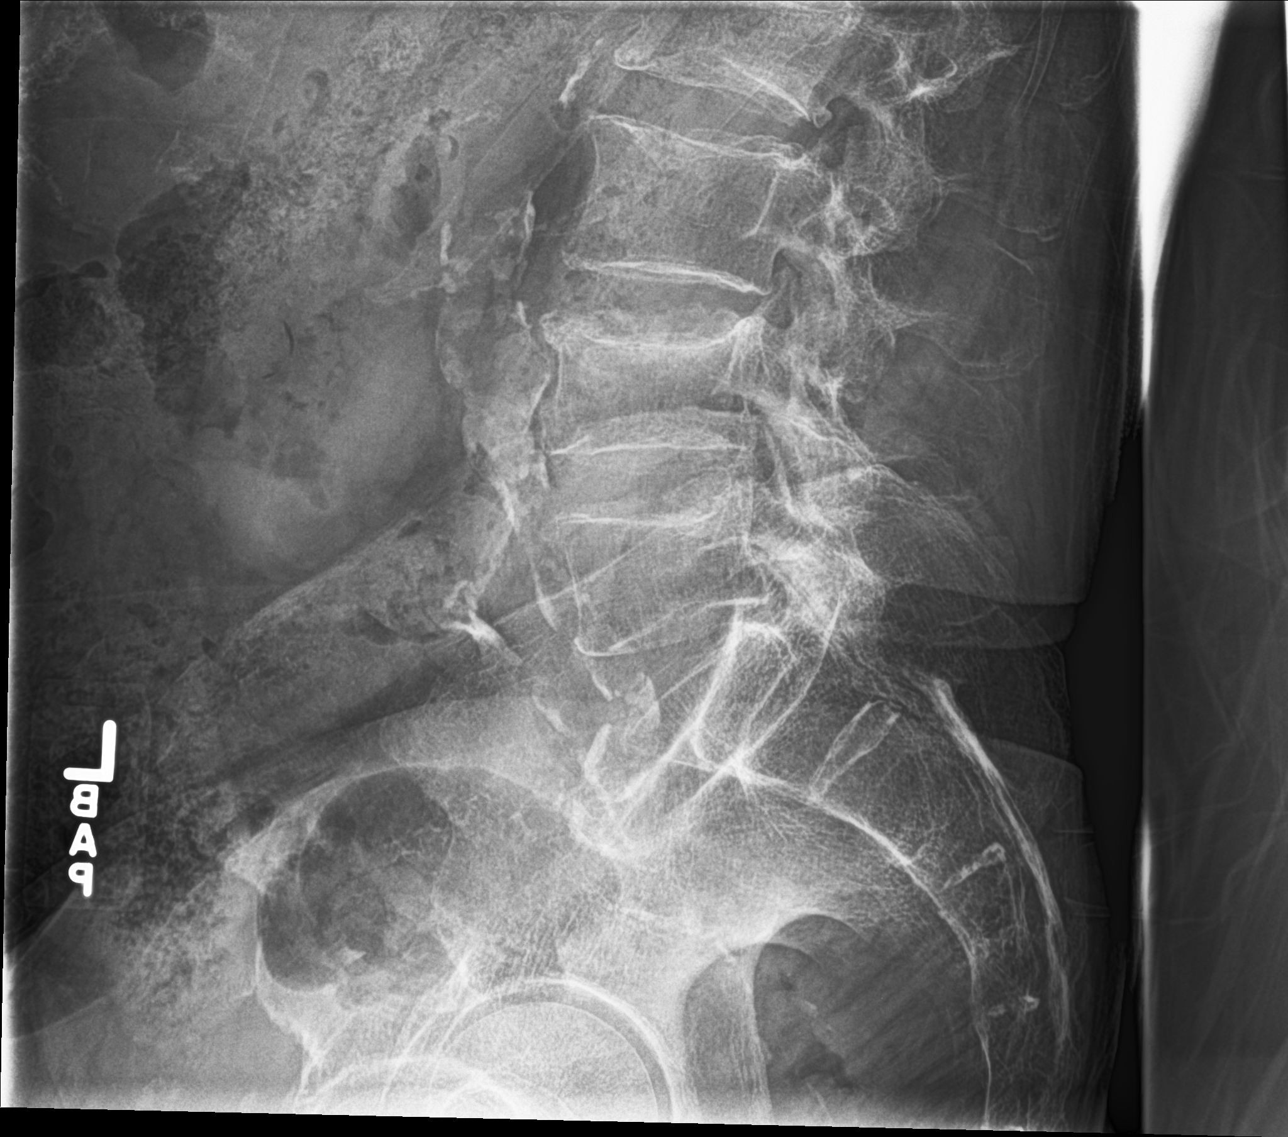

[5 of 5 positions shown; findings below may reference images not displayed]

FINDINGS: There is decrease in height of multiple lumbar vertebral bodies,
more severe in the L1, L2 and L4 vertebrae. Alignment of posterior
margins of vertebral bodies is unremarkable. Osteopenia is seen in
bony structures. Degenerative changes are noted with anterior bony
spurs. Facet hypertrophy is seen, more so at L5-S1 level. Arterial
calcifications are seen.
IMPRESSION: There is decrease in height of multiple lumbar vertebral bodies
suggesting recent or old compression fractures. Degenerative changes
are noted in the lumbar spine, more so in the facet joints at L5-S1
level.

## 2023-10-23 IMAGING — CR DG THORACIC SPINE 2V
3 series · 3 of 3 positions shown · non-contrast
Comparison: None.

CLINICAL DATA: Back pain

EXAM:
THORACIC SPINE 2 VIEWS

[t-spine lat]
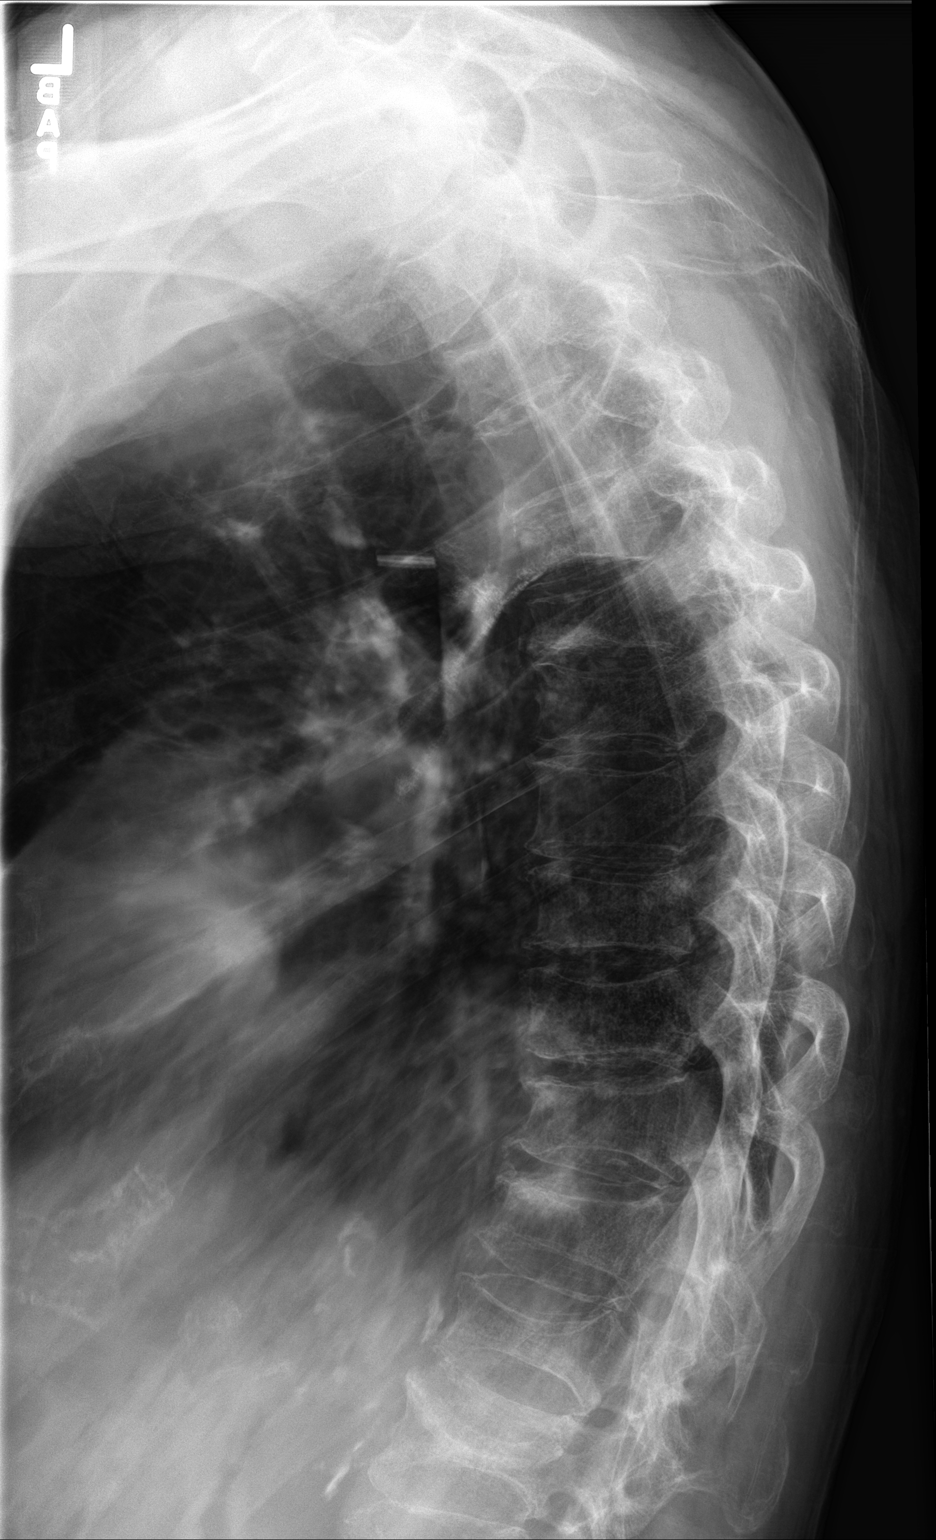

[t-spine swimmers]
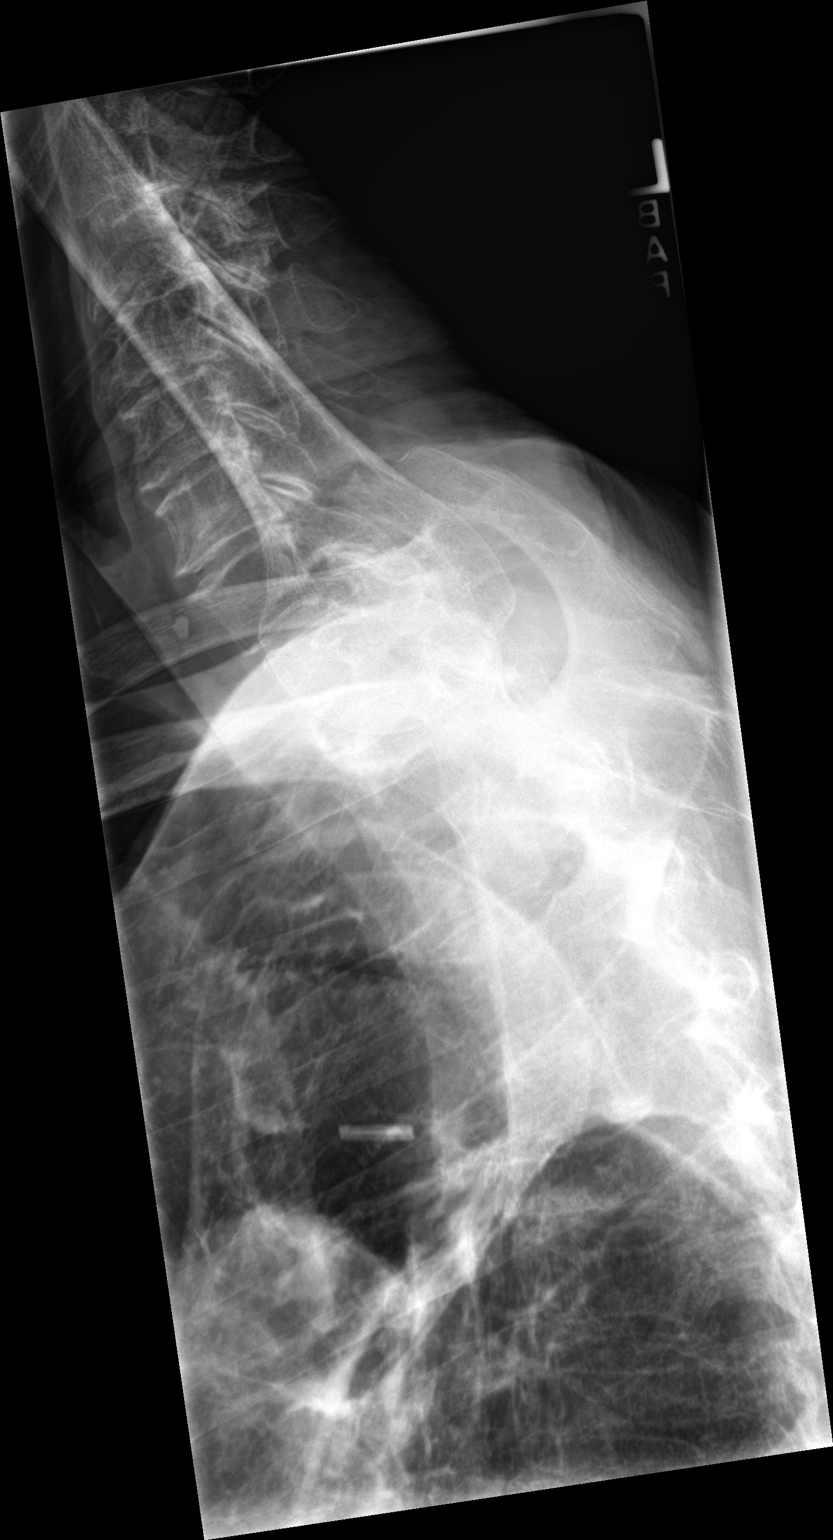

[t-spine ap]
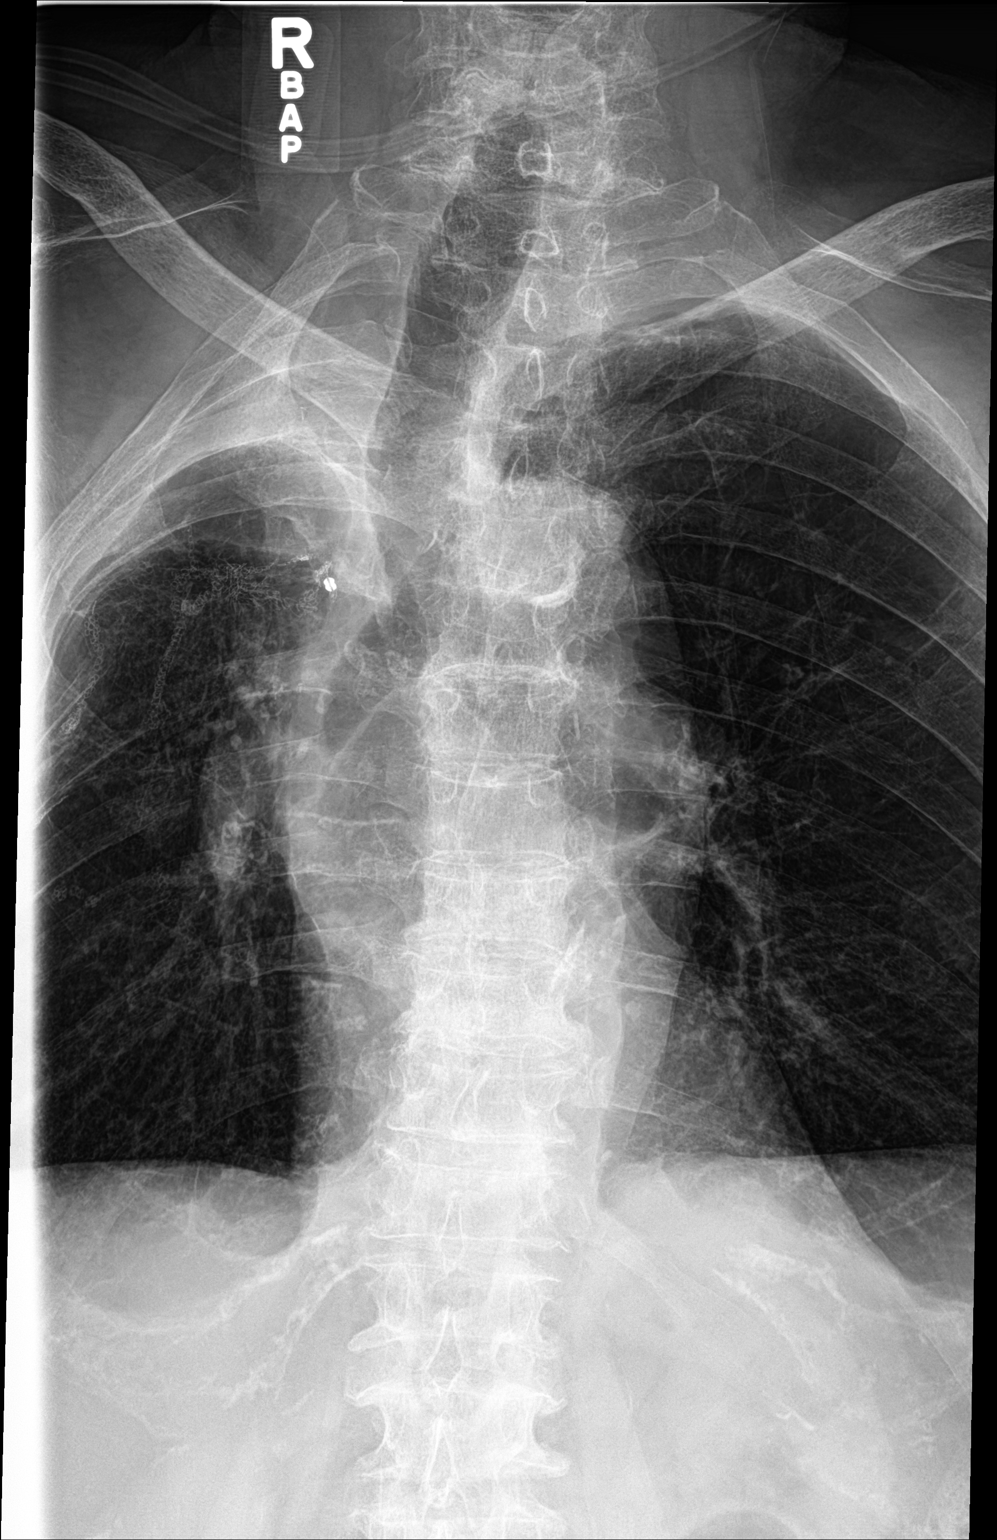

[3 of 3 positions shown; findings below may reference images not displayed]

FINDINGS: There is decrease in height of multiple mid and lower thoracic
vertebral bodies. Osteopenia is seen in bony structures. Alignment
of posterior margins of vertebral bodies is unremarkable. Paraspinal
soft tissues are unremarkable. Surgical staples are seen in the
right upper lung fields. There is asymmetric pleural density in the
right apex. There is decreased volume in right lung.
IMPRESSION: There is decrease in height of multiple mid and lower thoracic
vertebral bodies suggesting recent or old compression fractures.
Osteopenia.

Postsurgical changes are noted in the right upper lung fields.

## 2024-02-22 ENCOUNTER — Ambulatory Visit
Admission: EM | Admit: 2024-02-22 | Discharge: 2024-02-22 | Disposition: A | Discharge status: Home / Self Care | Attending: Family Medicine | Admitting: Family Medicine

## 2024-02-22 ENCOUNTER — Encounter: Payer: Self-pay | Admitting: Emergency Medicine

## 2024-02-22 DIAGNOSIS — J441 Chronic obstructive pulmonary disease with (acute) exacerbation: Secondary | ICD-10-CM

## 2024-02-22 LAB — POC COVID19/FLU A&B COMBO
Covid Antigen, POC: NEGATIVE
Influenza A Antigen, POC: NEGATIVE
Influenza B Antigen, POC: NEGATIVE

## 2024-02-22 MED ORDER — LEVOFLOXACIN 250 MG PO TABS: 250.0000 mg | ORAL_TABLET | Freq: Every day | ORAL | 0 refills | Status: AC

## 2024-02-22 MED ORDER — PREDNISONE 20 MG PO TABS: 20.0000 mg | ORAL_TABLET | Freq: Every day | ORAL | 0 refills | Status: AC

## 2024-02-22 MED ORDER — BENZONATATE 100 MG PO CAPS: 100.0000 mg | ORAL_CAPSULE | Freq: Three times a day (TID) | ORAL | 0 refills | Status: AC

## 2024-02-22 NOTE — Discharge Instructions (Addendum)
 Start Levaquin  antibiotic as prescribed.  You will take 2 tablets on the first day then 1 tablet for days 2 through 5.  You may take Tessalon  3 times a day as needed for your cough.  Will increase your prednisone  to 20 mg a day for 5 days.  After this you may resume your 5 mg daily regimen.  Continue your inhalers as needed.  Lots of rest.  Follow-up with your PCP in 2 days for recheck.  Please go to the ER if you develop any worsening symptoms.  I hope you feel better soon!

## 2024-02-22 NOTE — ED Provider Notes (Signed)
 " [facility]    CSN: [id number] Arrival date & time: 02/22/24  9041      History   Chief Complaint Chief Complaint  Patient presents with   Cough    HPI ESPIRIDION SUPINSKI is a 79 y.o. male with a complex past medical history including COPD on oxygen , CHF, GERD dents for cough.  Patient reports 2 days of a productive cough with increasing shortness of breath.  He denies any fevers, sore throat, vomiting or diarrhea, ear pain or bodyaches.  Has been using his albuterol  inhaler every 4 hours.  Does use a steroid inhaler daily as well.  He also takes 5 mg oral prednisone  daily.  No known sick contacts.  Denies any hospitalizations for COPD in the past year.  Has not taken any OTC cough medicine for symptoms.  No other concerns   Cough Associated symptoms: shortness of breath     Past Medical History:  Diagnosis Date   Bronchitis    CHF (congestive heart failure) (HCC)    COPD (chronic obstructive pulmonary disease) (HCC)    Diverticulitis    GERD (gastroesophageal reflux disease)    Hiatal hernia    Oxygen  dependent    5L [place] continuous   Wears dentures    full upper, partial lower.  do not fit well    Patient Active Problem List   Diagnosis Date Noted   Abrasion of right hand 09/09/2022   Acute on chronic diastolic CHF (congestive heart failure) (HCC) 11/25/2020   Chronic respiratory failure with hypoxia (HCC) 11/21/2020   Anemia 11/21/2020   GERD (gastroesophageal reflux disease) 11/21/2020   Palliative care by specialist    Shortness of breath 09/10/2020   COVID-19 virus infection 09/10/2020   COPD, severe (HCC) 05/05/2016   Allergic rhinitis 07/14/2013   Duodenitis 12/04/2012   Hiatal hernia 12/04/2012    Past Surgical History:  Procedure Laterality Date   CATARACT EXTRACTION W/PHACO Right 10/16/2021   Procedure: CATARACT EXTRACTION PHACO AND INTRAOCULAR LENS PLACEMENT (IOC) RIGHT;  Surgeon: Myrna Adine Anes, MD;  Location: [facility];   Service: Ophthalmology;  Laterality: Right;  13.19 1:04.3   CATARACT EXTRACTION W/PHACO Left 10/30/2021   Procedure: CATARACT EXTRACTION PHACO AND INTRAOCULAR LENS PLACEMENT (IOC) LEFT 10.02 00:50.0;  Surgeon: Myrna Adine Anes, MD;  Location: [facility];  Service: Ophthalmology;  Laterality: Left;   LUNG REMOVAL, PARTIAL  2005   removed 20% of right lung       Home Medications    Prior to Admission medications  Medication Sig Start Date End Date Taking? Authorizing Provider  benzonatate  (TESSALON ) 100 MG capsule Take 1 capsule (100 mg total) by mouth every 8 (eight) hours. 02/22/24  Yes Meghann Landing, Jodi R, NP  levofloxacin  (LEVAQUIN ) 250 MG tablet Take 1 tablet (250 mg total) by mouth daily. Take 2 tablets (500 mg) on day 1 then take 1 tablet (250 mg) on day 2 through 5 02/22/24  Yes Annalyse Langlais, Jodi R, NP  metoprolol succinate (TOPROL-XL) 25 MG 24 hr tablet Take 25 mg by mouth at bedtime. 12/03/23 12/02/24 Yes [provider]  predniSONE  (DELTASONE ) 20 MG tablet Take 1 tablet (20 mg total) by mouth daily with breakfast for 5 days. 02/22/24 02/27/24 Yes Venola Castello, Jodi R, NP  albuterol  (VENTOLIN  HFA) 108 (90 Base) MCG/ACT inhaler Inhale 2 puffs into the lungs every 4 (four) hours as needed for wheezing or shortness of breath. 09/23/20   Fausto Burnard LABOR, DO  Azelastine -Fluticasone  137-50 MCG/ACT SUSP Place  1 spray into the nose 2 (two) times daily. Patient not taking: Reported on 04/04/2023 11/07/22   Malachy Comer GAILS, NP  budesonide  (PULMICORT ) 0.5 MG/2ML nebulizer solution INHALE 1 VIAL VIA NEBULIZATION TWICE A DAY 04/04/23   Kasa, Kurian, MD  cetirizine (ZYRTEC) 10 MG tablet Take 1 tablet by mouth daily as needed.    [provider]  feeding supplement (ENSURE ENLIVE / ENSURE PLUS) LIQD Take 237 mLs by mouth 2 (two) times daily between meals. 09/23/20   Fausto Sor A, DO  furosemide  (LASIX ) 40 MG tablet Take 1 tablet (40 mg total) by mouth 2 (two) times daily. 11/25/20   Jens Durand, MD  ipratropium-albuterol  (DUONEB) 0.5-2.5 (3) MG/3ML SOLN Take 3 mLs by nebulization every 6 (six) hours as needed. 11/29/22   Tamea Dedra CROME, MD  lidocaine  (LIDODERM ) 5 % Apply 1 patch to area of pain daily.  Remove & Discard patch within 12 hours or as directed 05/04/21   Arvis Jolan NOVAK, PA-C  nebivolol  (BYSTOLIC ) 2.5 MG tablet Take 2.5 mg by mouth daily.    [provider]  nebivolol  (BYSTOLIC ) 5 MG tablet Take 0.5 tablets (2.5 mg total) by mouth daily. 11/25/20   Jens Durand, MD  omeprazole (PRILOSEC) 40 MG capsule Take 1 capsule by mouth 2 (two) times daily. 06/07/20   [provider]  OXYGEN  Inhale into the lungs. 5 liters    [provider]  potassium chloride  (KLOR-CON ) 10 MEQ tablet Take 1 tablet (10 mEq total) by mouth daily. 11/25/20   Jens Durand, MD  predniSONE  (DELTASONE ) 5 MG tablet Take 5 mg by mouth daily with breakfast.    [provider]    Family History Family History  Problem Relation Age of Onset   Heart disease Mother    Kidney failure Father     Social History Social History[1]   Allergies   Alpha blocker quinazolines, Bromelains, Clindamycin/lincomycin, Doxycycline , Levaquin  [levofloxacin ], Penicillins, Singulair  [montelukast  sodium], Sulfa antibiotics, Tamsulosin, and Zinc   Review of Systems Review of Systems  HENT:  Positive for congestion.   Respiratory:  Positive for cough and shortness of breath.      Physical Exam Triage Vital Signs ED Triage Vitals  Encounter Vitals Group     BP 02/22/24 1022 124/71     Girls Systolic BP Percentile --      Girls Diastolic BP Percentile --      Boys Systolic BP Percentile --      Boys Diastolic BP Percentile --      Pulse Rate 02/22/24 1022 (!) 122     Resp 02/22/24 1022 (!) 22     Temp 02/22/24 1022 100 F (37.8 C)     Temp Source 02/22/24 1022 Oral     SpO2 02/22/24 1022 98 %     Weight 02/22/24 1018 136 lb 14.5 oz (62.1 kg)     Height 02/22/24  1018 5' 6 (1.676 m)     Head Circumference --      Peak Flow --      Pain Score 02/22/24 1018 0     Pain Loc --      Pain Education --      Exclude from Growth Chart --    No data found.  Updated Vital Signs BP 124/71 (BP Location: Left Arm)   Pulse (!) 122   Temp 100 F (37.8 C) (Oral)   Resp (!) 22   Ht 5' 6 (1.676 m)   Wt 136 lb  14.5 oz (62.1 kg)   SpO2 98%   BMI 22.10 kg/m   Visual Acuity Right Eye Distance:   Left Eye Distance:   Bilateral Distance:    Right Eye Near:   Left Eye Near:    Bilateral Near:     Physical Exam Vitals and nursing note reviewed.  Constitutional:      General: He is not in acute distress.    Appearance: Normal appearance. He is not ill-appearing or toxic-appearing.  HENT:     Head: Normocephalic and atraumatic.     Right Ear: Tympanic membrane and ear canal normal.     Left Ear: Tympanic membrane and ear canal normal.     Nose: Congestion present.     Mouth/Throat:     Mouth: Mucous membranes are moist.     Pharynx: No oropharyngeal exudate or posterior oropharyngeal erythema.  Eyes:     Pupils: Pupils are equal, round, and reactive to light.  Cardiovascular:     Rate and Rhythm: Normal rate and regular rhythm.     Heart sounds: Normal heart sounds.  Pulmonary:     Effort: Pulmonary effort is normal.     Breath sounds: Wheezing present. No rhonchi or rales.     Comments: Patient on O2 portable.  Very slight expiratory wheeze bilateral bases Musculoskeletal:     Cervical back: Normal range of motion and neck supple.  Lymphadenopathy:     Cervical: No cervical adenopathy.  Skin:    General: Skin is warm and dry.  Neurological:     General: No focal deficit present.     Mental Status: He is alert and oriented to person, place, and time.  Psychiatric:        Mood and Affect: Mood normal.        Behavior: Behavior normal.      UC Treatments / Results  Labs (all labs ordered are listed, but only abnormal results are  displayed) Labs Reviewed  POC COVID19/FLU A&B COMBO - Normal   Renal function panel Order: [id number] Component Ref Range & Units 4 mo ago  Glucose 65 - 99 mg/dL 91  Comment:                                                                                             Fasting reference interval                                           BUN 7 - 25 mg/dL 20  Creatinine 9.29 - 8.71 mg/dL 8.49 High   eGFR CKD-EPI CR 2021 > OR = 60 mL/min/1.17m2 47 Low   BUN/Creatinine Ratio 6 - 22 (calc) 13  Sodium 135 - 146 mmol/L 139  Potassium 3.5 - 5.3 mmol/L 3.9  Chloride 98 - 110 mmol/L 93 Low   Bicarbonate (CO2) 20 - 32 mmol/L 37 High   Calcium  8.6 - 10.3 mg/dL 9.3  Phosphorus 2.1 - 4.3 mg/dL 2.8  Albumin 3.6 - 5.1 g/dL 3.8  Resulting Agency [facility]  Specimen Collected: 10/09/23 10:03   Performed by: ORLIN SPIEGEL Last Resulted: 10/09/23 23:31  Received From: [facility]  Result Received: 12/30/23 08:46   EKG   Radiology No results found.  Procedures Procedures (including critical care time)  Medications Ordered in UC Medications - No data to display  Initial Impression / Assessment and Plan / UC Course  I have reviewed the triage vital signs and the nursing notes.  Pertinent labs & imaging results that were available during my care of the patient were reviewed by me and considered in my medical decision making (see chart for details).     Negative COVID and flu testing.  Patient declines nebulizer stating he will take 1 when he gets home.  Will treat for COPD exacerbation.  Labs reviewed, creatinine clearance 35 mL/min.  Will do renally dosed Levaquin  with 500 mg on day 1 then 250mg  daily for days 2 through 5.  Will increase his 5 mg prednisone  to 20 mg daily for 5 days and he will resume his 5 mg regimen.  Tessalon  as needed for cough.  He will continue his inhalers/nebulizers as needed.  He is to follow-up with his PCP in 2 days for recheck.   Strict ER precautions reviewed and patient verbalized understanding Final Clinical Impressions(s) / UC Diagnoses   Final diagnoses:  COPD exacerbation (HCC)     Discharge Instructions      Start Levaquin  antibiotic as prescribed.  You will take 2 tablets on the first day then 1 tablet for days 2 through 5.  You may take Tessalon  3 times a day as needed for your cough.  Will increase your prednisone  to 20 mg a day for 5 days.  After this you may resume your 5 mg daily regimen.  Continue your inhalers as needed.  Lots of rest.  Follow-up with your PCP in 2 days for recheck.  Please go to the ER if you develop any worsening symptoms.  I hope you feel better soon!    ED Prescriptions     Medication Sig Dispense Auth. Provider   benzonatate  (TESSALON ) 100 MG capsule Take 1 capsule (100 mg total) by mouth every 8 (eight) hours. 21 capsule Tashya Alberty, Jodi R, NP   predniSONE  (DELTASONE ) 20 MG tablet Take 1 tablet (20 mg total) by mouth daily with breakfast for 5 days. 5 tablet Tomasina Keasling, Jodi R, NP   levofloxacin  (LEVAQUIN ) 250 MG tablet Take 1 tablet (250 mg total) by mouth daily. Take 2 tablets (500 mg) on day 1 then take 1 tablet (250 mg) on day 2 through 5 6 tablet Lorilee Cafarella, Jodi R, NP      PDMP not reviewed this encounter.    [1]  Social History Tobacco Use   Smoking status: Former    Current packs/day: 0.00    Average packs/day: 1 pack/day for 40.0 years (40.0 ttl pk-yrs)    Types: Cigarettes    Start date: 10/04/1963    Quit date: 10/04/2003    Years since quitting: 20.4   Smokeless tobacco: Never  Vaping Use   Vaping status: Never Used  Substance Use Topics   Alcohol use: No   Drug use: No     Loreda Myla SAUNDERS, NP 02/22/24 1111  "

## 2024-02-22 NOTE — ED Triage Notes (Signed)
 Patient has COPD.  Patient c/o ongoing cough, chest congestion and SOB that started yesterday.  Patient unsure of fevers.
